# Patient Record
Sex: Female | Born: 1985 | Race: Black or African American | Hispanic: No | Marital: Single | Smoking: Current every day smoker
Health system: Southern US, Community
[De-identification: ages and names within clinical notes are randomized; demographics above are authoritative.]

## PROBLEM LIST (undated history)

## (undated) ENCOUNTER — Ambulatory Visit: Admission: EM

## (undated) DIAGNOSIS — R0600 Dyspnea, unspecified: Secondary | ICD-10-CM

## (undated) DIAGNOSIS — I209 Angina pectoris, unspecified: Secondary | ICD-10-CM

## (undated) DIAGNOSIS — I10 Essential (primary) hypertension: Secondary | ICD-10-CM

## (undated) DIAGNOSIS — R600 Localized edema: Secondary | ICD-10-CM

## (undated) DIAGNOSIS — E0500 Thyrotoxicosis with diffuse goiter without thyrotoxic crisis or storm: Secondary | ICD-10-CM

## (undated) DIAGNOSIS — Z8489 Family history of other specified conditions: Secondary | ICD-10-CM

## (undated) DIAGNOSIS — F17200 Nicotine dependence, unspecified, uncomplicated: Secondary | ICD-10-CM

## (undated) DIAGNOSIS — E119 Type 2 diabetes mellitus without complications: Secondary | ICD-10-CM

---

## 2006-02-04 ENCOUNTER — Emergency Department: Payer: Self-pay | Admitting: Emergency Medicine

## 2006-04-26 ENCOUNTER — Emergency Department: Payer: Self-pay | Admitting: Emergency Medicine

## 2007-09-06 ENCOUNTER — Emergency Department: Payer: Self-pay | Admitting: Emergency Medicine

## 2009-12-22 ENCOUNTER — Ambulatory Visit: Payer: Self-pay | Admitting: Obstetrics and Gynecology

## 2010-01-13 ENCOUNTER — Ambulatory Visit: Payer: Self-pay | Admitting: Obstetrics and Gynecology

## 2010-04-10 ENCOUNTER — Observation Stay: Payer: Self-pay | Admitting: Obstetrics and Gynecology

## 2010-04-17 ENCOUNTER — Observation Stay: Payer: Self-pay | Admitting: Obstetrics and Gynecology

## 2010-04-24 ENCOUNTER — Observation Stay: Payer: Self-pay | Admitting: Obstetrics and Gynecology

## 2010-05-01 ENCOUNTER — Observation Stay: Payer: Self-pay | Admitting: Obstetrics and Gynecology

## 2010-05-08 ENCOUNTER — Observation Stay: Payer: Self-pay

## 2010-05-15 ENCOUNTER — Observation Stay: Payer: Self-pay | Admitting: Obstetrics and Gynecology

## 2010-05-22 ENCOUNTER — Observation Stay: Payer: Self-pay | Admitting: Certified Nurse Midwife

## 2010-05-27 ENCOUNTER — Observation Stay: Payer: Self-pay | Admitting: Obstetrics and Gynecology

## 2010-05-28 ENCOUNTER — Inpatient Hospital Stay: Payer: Self-pay | Admitting: Obstetrics and Gynecology

## 2010-12-15 ENCOUNTER — Emergency Department: Payer: Self-pay | Admitting: Unknown Physician Specialty

## 2011-10-18 ENCOUNTER — Emergency Department: Payer: Self-pay | Admitting: Emergency Medicine

## 2012-03-13 ENCOUNTER — Emergency Department: Payer: Self-pay | Admitting: *Deleted

## 2012-03-27 ENCOUNTER — Emergency Department: Payer: Self-pay | Admitting: Emergency Medicine

## 2012-09-13 ENCOUNTER — Observation Stay (HOSPITAL_COMMUNITY): Payer: PRIVATE HEALTH INSURANCE

## 2012-09-13 ENCOUNTER — Emergency Department (HOSPITAL_COMMUNITY): Payer: PRIVATE HEALTH INSURANCE

## 2012-09-13 ENCOUNTER — Inpatient Hospital Stay (HOSPITAL_COMMUNITY)
Admission: EM | Admit: 2012-09-13 | Discharge: 2012-09-15 | DRG: 194 | Disposition: A | Discharge status: Home / Self Care | Payer: PRIVATE HEALTH INSURANCE | Attending: Internal Medicine | Admitting: Internal Medicine

## 2012-09-13 ENCOUNTER — Encounter (HOSPITAL_COMMUNITY): Payer: Self-pay | Admitting: *Deleted

## 2012-09-13 DIAGNOSIS — D72829 Elevated white blood cell count, unspecified: Secondary | ICD-10-CM | POA: Diagnosis present

## 2012-09-13 DIAGNOSIS — J9859 Other diseases of mediastinum, not elsewhere classified: Secondary | ICD-10-CM | POA: Diagnosis present

## 2012-09-13 DIAGNOSIS — J189 Pneumonia, unspecified organism: Principal | ICD-10-CM | POA: Diagnosis present

## 2012-09-13 DIAGNOSIS — R0602 Shortness of breath: Secondary | ICD-10-CM | POA: Diagnosis present

## 2012-09-13 DIAGNOSIS — F17200 Nicotine dependence, unspecified, uncomplicated: Secondary | ICD-10-CM | POA: Diagnosis present

## 2012-09-13 DIAGNOSIS — R002 Palpitations: Secondary | ICD-10-CM | POA: Diagnosis present

## 2012-09-13 DIAGNOSIS — R222 Localized swelling, mass and lump, trunk: Secondary | ICD-10-CM | POA: Diagnosis present

## 2012-09-13 DIAGNOSIS — Z6841 Body Mass Index (BMI) 40.0 and over, adult: Secondary | ICD-10-CM

## 2012-09-13 LAB — COMPREHENSIVE METABOLIC PANEL
ALT: 22 U/L (ref 0–35)
AST: 15 U/L (ref 0–37)
CO2: 23 mEq/L (ref 19–32)
Chloride: 103 mEq/L (ref 96–112)
GFR calc non Af Amer: >90 mL/min (ref >90)
Sodium: 135 mEq/L (ref 135–145)
Total Bilirubin: 0.4 mg/dL (ref 0.3–1.2)

## 2012-09-13 LAB — POCT I-STAT, CHEM 8
Calcium, Ion: 1.17 mmol/L (ref 1.12–1.23)
Chloride: 109 mEq/L (ref 96–112)
Glucose, Bld: 106 mg/dL — ABNORMAL HIGH (ref 70–99)
HCT: 35 % — ABNORMAL LOW (ref 36.0–46.0)
Hemoglobin: 11.9 g/dL — ABNORMAL LOW (ref 12.0–15.0)

## 2012-09-13 LAB — CBC
Platelets: 232 10*3/uL (ref 150–400)
RBC: 4.47 MIL/uL (ref 3.87–5.11)
WBC: 9.5 10*3/uL (ref 4.0–10.5)

## 2012-09-13 LAB — INFLUENZA PANEL BY PCR (TYPE A & B)
Influenza A By PCR: NEGATIVE
Influenza B By PCR: NEGATIVE

## 2012-09-13 LAB — PHOSPHORUS: Phosphorus: 4.2 mg/dL (ref 2.3–4.6)

## 2012-09-13 MED ORDER — IPRATROPIUM BROMIDE 0.02 % IN SOLN: 0.5000 mg | RESPIRATORY_TRACT | Status: DC | PRN

## 2012-09-13 MED ORDER — DEXTROSE 5 % IV SOLN
1.0000 g | INTRAVENOUS | Status: DC
  Administered 2012-09-14: 1 g via INTRAVENOUS
  Filled 2012-09-13 (×2): qty 10

## 2012-09-13 MED ORDER — ALBUTEROL SULFATE (5 MG/ML) 0.5% IN NEBU: 2.5000 mg | INHALATION_SOLUTION | RESPIRATORY_TRACT | Status: DC | PRN

## 2012-09-13 MED ORDER — DEXTROSE 5 % IV SOLN
1.0000 g | Freq: Once | INTRAVENOUS | Status: AC
  Administered 2012-09-13: 1 g via INTRAVENOUS
  Filled 2012-09-13: qty 10

## 2012-09-13 MED ORDER — ONDANSETRON HCL 4 MG/2ML IJ SOLN
4.0000 mg | Freq: Once | INTRAMUSCULAR | Status: AC
Start: 2012-09-13 — End: 2012-09-13
  Administered 2012-09-13: 4 mg via INTRAVENOUS
  Filled 2012-09-13: qty 2

## 2012-09-13 MED ORDER — DEXTROSE 5 % IV SOLN
500.0000 mg | INTRAVENOUS | Status: DC
  Administered 2012-09-14: 500 mg via INTRAVENOUS
  Filled 2012-09-13 (×2): qty 500

## 2012-09-13 MED ORDER — SODIUM CHLORIDE 0.9 % IV BOLUS (SEPSIS)
1000.0000 mL | Freq: Once | INTRAVENOUS | Status: AC
  Administered 2012-09-13: 1000 mL via INTRAVENOUS

## 2012-09-13 MED ORDER — METOPROLOL TARTRATE 25 MG PO TABS
25.0000 mg | ORAL_TABLET | Freq: Two times a day (BID) | ORAL | Status: DC
  Administered 2012-09-13 – 2012-09-15 (×4): 25 mg via ORAL
  Filled 2012-09-13 (×5): qty 1

## 2012-09-13 MED ORDER — IOHEXOL 350 MG/ML SOLN
100.0000 mL | Freq: Once | INTRAVENOUS | Status: AC | PRN
  Administered 2012-09-13: 100 mL via INTRAVENOUS

## 2012-09-13 MED ORDER — DEXTROSE 5 % IV SOLN
500.0000 mg | Freq: Once | INTRAVENOUS | Status: AC
  Administered 2012-09-13: 500 mg via INTRAVENOUS
  Filled 2012-09-13: qty 500

## 2012-09-13 MED ORDER — FENTANYL CITRATE 0.05 MG/ML IJ SOLN
50.0000 ug | Freq: Once | INTRAMUSCULAR | Status: AC
  Administered 2012-09-13: 50 ug via INTRAVENOUS
  Filled 2012-09-13: qty 2

## 2012-09-13 NOTE — ED Notes (Signed)
Patient transported to CT 

## 2012-09-13 NOTE — ED Notes (Signed)
Pt states hasn't felt well x 4 hours; felt jittery; chest pain rating 5/10; presents diaphoretic and short of breath

## 2012-09-13 NOTE — Progress Notes (Signed)
Patient ID: Kimberly Nolan, female   DOB: [birth year], 26 y.o.   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 No sig PMH noted. Pt presented to ER today with chest pain/dyspnea. Nondiagnostic eval for PE. Imaging studies were reviewed by Dr. Archer Asa. Exam: pt awake/alert; chest- CTA bilat; heart- tachycardic, reg rhythm; abd- soft,obese, +BS,NT.      Filed Vitals:   09/13/12 0712 09/13/12 0931 09/13/12 1112 09/13/12 1459  BP: 142/85 152/87 146/90 140/69  Pulse: 132 134  123  Temp:  99.1 F (37.3 C) 98.5 F (36.9 C) 99.7 F (37.6 C)  TempSrc:  Oral Oral Oral  Resp: 27 29 20 20   Height:   5\' 7"  (1.702 m)   Weight:   330 lb 11 oz (150 kg)   SpO2: 97% 100% 100% 100%   History reviewed. No pertinent past medical history. Past Surgical History  Procedure Date  . Cesarean section   Ct Angio Chest Pe W/cm &/or Wo Cm  09/13/2012  *RADIOLOGY REPORT*  Clinical Data: Chest pain.  Shortness of breath.  Diaphoresis.  CT ANGIOGRAPHY CHEST  Technique:  Multidetector CT imaging of the chest using the standard protocol during bolus administration of intravenous contrast. Multiplanar reconstructed images including MIPs were obtained and reviewed to evaluate the vascular anatomy.  Contrast: OMNIPAQUE IOHEXOL 350 MG/ML SOLN  Comparison: None.  Findings: Lung windows demonstrate mild motion degradation. Airspace and ground-glass opacities within the dependent upper lobes and lower lobes.  Soft tissue windows:  The quality of this exam for evaluation of pulmonary embolism is poor, despite 2 attempts.  The second attempt is slightly better.  However, due to patient body habitus, bolus timing, and motion artifact, is also nondiagnostic.  There is no large/central pulmonary embolism.  The thyroid gland is diffusely prominent, extending into the upper chest.  No dominant mass is identified.  Normal aortic caliber without dissection.  Mild cardiomegaly, without pericardial  or pleural effusion.  No middle mediastinal or hilar adenopathy.  The pulmonary outflow tract is mildly enlarged, at 3.2 cm.  Soft tissue density in the anterior mediastinum.  This measures 4.6 x 5.5 cm on image 20 of series 12.  Limited abdominal imaging demonstrates no significant findings.  No upper abdominal adenopathy. No acute osseous abnormality.  IMPRESSION:  1.  Poor/nondiagnostic evaluation for pulmonary embolism secondary multiple factors detailed above. 2.  Multifocal airspace and ground-glass opacities.  Most consistent with infection.  Aspiration could look similar but is felt less likely. 3.  Soft tissue fullness in the anterior mediastinum.  This is greater than typically seen  secondary to thymic tissue in this age group, therefore this is suspicious for adenopathy/lymphoma or thymic based mass.  Consider further characterization with PET or oncology referral. Findings discussed with Dr. Dierdre Highman. 4. Pulmonary artery enlargement suggests pulmonary arterial hypertension.   Original Report Authenticated By: Consuello Bossier, M.D.   Results for orders placed during the hospital encounter of 09/13/12  CBC      Component Value Range   WBC 9.5  4.0 - 10.5 K/uL   RBC 4.47  3.87 - 5.11 MIL/uL   Hemoglobin 11.3 (*) 12.0 - 15.0 g/dL   HCT 13.0 (*) 86.5 - 78.4 %   MCV 77.2 (*) 78.0 - 100.0 fL   MCH 25.3 (*) 26.0 - 34.0 pg   MCHC 32.8  30.0 - 36.0 g/dL   RDW 69.6  29.5 - 28.4 %   Platelets 232  150 -  400 K/uL  COMPREHENSIVE METABOLIC PANEL      Component Value Range   Sodium 135  135 - 145 mEq/L   Potassium 3.8  3.5 - 5.1 mEq/L   Chloride 103  96 - 112 mEq/L   CO2 23  19 - 32 mEq/L   Glucose, Bld 99  70 - 99 mg/dL   BUN 9  6 - 23 mg/dL   Creatinine, Ser 1.61 (*) 0.50 - 1.10 mg/dL   Calcium 9.5  8.4 - 09.6 mg/dL   Total Protein 7.5  6.0 - 8.3 g/dL   Albumin 2.9 (*) 3.5 - 5.2 g/dL   AST 15  0 - 37 U/L   ALT 22  0 - 35 U/L   Alkaline Phosphatase 231 (*) 39 - 117 U/L   Total Bilirubin 0.4   0.3 - 1.2 mg/dL   GFR calc non Af Amer >90  >90 mL/min   GFR calc Af Amer >90  >90 mL/min  POCT I-STAT, CHEM 8      Component Value Range   Sodium 142  135 - 145 mEq/L   Potassium 4.5  3.5 - 5.1 mEq/L   Chloride 109  96 - 112 mEq/L   BUN 10  6 - 23 mg/dL   Creatinine, Ser 0.45  0.50 - 1.10 mg/dL   Glucose, Bld 409 (*) 70 - 99 mg/dL   Calcium, Ion 8.11  9.14 - 1.23 mmol/L   TCO2 23  0 - 100 mmol/L   Hemoglobin 11.9 (*) 12.0 - 15.0 g/dL   HCT 78.2 (*) 95.6 - 21.3 %  LACTIC ACID, PLASMA      Component Value Range   Lactic Acid, Venous 1.2  0.5 - 2.2 mmol/L  HIV ANTIBODY (ROUTINE TESTING)      Component Value Range   HIV NON REACTIVE  NON REACTIVE  STREP PNEUMONIAE URINARY ANTIGEN      Component Value Range   Strep Pneumo Urinary Antigen NEGATIVE  NEGATIVE  MAGNESIUM      Component Value Range   Magnesium 1.7  1.5 - 2.5 mg/dL  PHOSPHORUS      Component Value Range   Phosphorus 4.2  2.3 - 4.6 mg/dL   A/P: Pt with hx chest pain , dyspnea, tachycardia and ant mediastinal mass. Tent plan is for CT guided biopsy of the mediastinal mass on 10/31. Details/risks of the procedure d/w pt with her understanding and consent. TSH pending. Ideally would like to have heart rate <100 before performing bx.

## 2012-09-13 NOTE — ED Notes (Signed)
Labs/ blood cultures drawn by charge nurse prior to antibiotics given

## 2012-09-13 NOTE — ED Notes (Signed)
Pt refusing having blood drawn - have not drawn second set of blood cultures. MD Elisabeth Pigeon and floor RN notified

## 2012-09-13 NOTE — ED Notes (Signed)
3e called x1 for report, nurse not able to take report will call back

## 2012-09-13 NOTE — ED Notes (Signed)
Unable to draw blood on pt, pt refusing to be stuck by this writer anymore. Charge nurse at bedside to draw blood. Admitting MD notified. MD Elisabeth Pigeon confirmed to give antibiotics now, will still try to draw cultures and will report to floor RN.  Pt ambulating to bathroom while tachycardic, advised pt not to ambulate

## 2012-09-13 NOTE — ED Provider Notes (Signed)
History     CSN: [id number]  Arrival date & time 09/13/12  0518   First MD Initiated Contact with Patient 09/13/12 [phone]      Chief Complaint  Patient presents with  . Palpitations    (Consider location/radiation/quality/duration/timing/severity/associated sxs/prior treatment) HPI HX per PT. Went to bed with a mild HA after taking some tylenol, woke up PTA with substernal CP and SOB, pain is sharp in quality and constant. No radiation, no h/o same, no h/o anxiety. No leg pain or swelling, no h/o DVT or PE> takes Depo and smokes cigarettes. No cough or fevers. Symptoms mod to severe History reviewed. No pertinent past medical history.  History reviewed. No pertinent past surgical history.  No family history on file.  History  Substance Use Topics  . Smoking status: Current Every Day Smoker    Types: Cigarettes  . Smokeless tobacco: Not on file  . Alcohol Use: No    OB History    Grav Para Term Preterm Abortions TAB SAB Ect Mult Living                  Review of Systems  Constitutional: Negative for fever and chills.  HENT: Negative for neck pain and neck stiffness.   Eyes: Negative for pain.  Respiratory: Positive for shortness of breath.   Cardiovascular: Positive for chest pain and palpitations. Negative for leg swelling.  Gastrointestinal: Negative for abdominal pain.  Genitourinary: Negative for dysuria.  Musculoskeletal: Negative for back pain.  Skin: Negative for rash.  Neurological: Negative for headaches.  All other systems reviewed and are negative.    Allergies  Review of patient's allergies indicates no known allergies.  Home Medications  No current outpatient prescriptions on file.  BP 163/85  Pulse 135  Temp 98.7 F (37.1 C) (Oral)  Resp 28  SpO2 98%  LMP 08/30/2012  Physical Exam  Constitutional: She is oriented to person, place, and time. She appears well-developed and well-nourished.  HENT:  Head: Normocephalic and atraumatic.    Eyes: Conjunctivae normal and EOM are normal. Pupils are equal, round, and reactive to light.  Neck: Trachea normal. Neck supple. No thyromegaly present.  Cardiovascular: Regular rhythm, S1 normal, S2 normal and normal pulses.     No systolic murmur is present   No diastolic murmur is present  Pulses:      Radial pulses are 2+ on the right side, and 2+ on the left side.       tachycardic  Pulmonary/Chest: Effort normal and breath sounds normal. She has no wheezes. She has no rhonchi. She has no rales. She exhibits no tenderness.  Abdominal: Soft. Normal appearance and bowel sounds are normal. There is no tenderness. There is no CVA tenderness and negative Murphy's sign.  Musculoskeletal:       BLE:s Calves nontender, no cords or erythema, negative Homans sign  Neurological: She is alert and oriented to person, place, and time. She has normal strength. No cranial nerve deficit or sensory deficit. GCS eye subscore is 4. GCS verbal subscore is 5. GCS motor subscore is 6.  Skin: Skin is warm and dry. No rash noted. She is not diaphoretic.  Psychiatric: Her speech is normal.       Cooperative and appropriate    ED Course  Procedures (including critical care time)  Results for orders placed during the hospital encounter of 09/13/12  POCT I-STAT, CHEM 8      Component Value Range   Sodium 142  135 -  145 mEq/L   Potassium 4.5  3.5 - 5.1 mEq/L   Chloride 109  96 - 112 mEq/L   BUN 10  6 - 23 mg/dL   Creatinine, Ser 1.61  0.50 - 1.10 mg/dL   Glucose, Bld 096 (*) 70 - 99 mg/dL   Calcium, Ion 0.45  4.09 - 1.23 mmol/L   TCO2 23  0 - 100 mmol/L   Hemoglobin 11.9 (*) 12.0 - 15.0 g/dL   HCT 81.1 (*) 91.4 - 78.2 %   Ct Angio Chest Pe W/cm &/or Wo Cm  09/13/2012  *RADIOLOGY REPORT*  Clinical Data: Chest pain.  Shortness of breath.  Diaphoresis.  CT ANGIOGRAPHY CHEST  Technique:  Multidetector CT imaging of the chest using the standard protocol during bolus administration of intravenous contrast.  Multiplanar reconstructed images including MIPs were obtained and reviewed to evaluate the vascular anatomy.  Contrast: OMNIPAQUE IOHEXOL 350 MG/ML SOLN  Comparison: None.  Findings: Lung windows demonstrate mild motion degradation. Airspace and ground-glass opacities within the dependent upper lobes and lower lobes.  Soft tissue windows:  The quality of this exam for evaluation of pulmonary embolism is poor, despite 2 attempts.  The second attempt is slightly better.  However, due to patient body habitus, bolus timing, and motion artifact, is also nondiagnostic.  There is no large/central pulmonary embolism.  The thyroid gland is diffusely prominent, extending into the upper chest.  No dominant mass is identified.  Normal aortic caliber without dissection.  Mild cardiomegaly, without pericardial or pleural effusion.  No middle mediastinal or hilar adenopathy.  The pulmonary outflow tract is mildly enlarged, at 3.2 cm.  Soft tissue density in the anterior mediastinum.  This measures 4.6 x 5.5 cm on image 20 of series 12.  Limited abdominal imaging demonstrates no significant findings.  No upper abdominal adenopathy. No acute osseous abnormality.  IMPRESSION:  1.  Poor/nondiagnostic evaluation for pulmonary embolism secondary multiple factors detailed above. 2.  Multifocal airspace and ground-glass opacities.  Most consistent with infection.  Aspiration could look similar but is felt less likely. 3.  Soft tissue fullness in the anterior mediastinum.  This is greater than typically seen  secondary to thymic tissue in this age group, therefore this is suspicious for adenopathy/lymphoma or thymic based mass.  Consider further characterization with PET or oncology referral. Findings discussed with Dr. Dierdre Highman. 4. Pulmonary artery enlargement suggests pulmonary arterial hypertension.   Original Report Authenticated By: Consuello Bossier, M.D.       Date: 09/13/2012  Rate: 134  Rhythm: sinus tachycardia  QRS Axis:  normal  Intervals: normal  ST/T Wave abnormalities: nonspecific ST changes  Conduction Disutrbances:none  Narrative Interpretation:   Old EKG Reviewed: none available   7:47 AM d/w Dr Elisabeth Pigeon, pending CBC, IV ABX rocephin and azithro for bilat PNA. Hopitalist agree with initial ABX and will admit.  MDM   26 yo female with tachycradia, SOB and CP sent for PE study on arrival to ED as she is a smoker and on Depo. CT as above, started on ABX and plan MED admit. IVFs for tachycradia, no hypoxia or hypotension. Afebrile. Mediastinal mass        Kimberly Nielsen, MD 09/13/12 [phone]

## 2012-09-13 NOTE — ED Notes (Signed)
Patient has refused blood draw for second blood culture.RN made aware

## 2012-09-13 NOTE — H&P (Addendum)
[facility] History and Physical  Kimberly Nolan ZOX:[id number] DOB: December 05, 1985 DOA: 09/13/2012  Referring physician: ER physician PCP: No primary provider on file.   Chief Complaint: shortness of breath  HPI:  26 year old female, active smoker who presented with sudden onset palpitations and shortness of breath started one night prior to admission associated with substernal chest pain. Chest pain was non reproducible, non radiating and subsided on its own. It was constant, about 5-6/10 in intensity. No abdominal pain, no nausea or vomiting. Patient reports no simliar symptoms in past. No lightheadedness or loss of consciousness.  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 Negative for diaphoresis.  HENT: Negative for hearing loss, ear pain, nosebleeds, congestion, sore throat, neck pain, tinnitus and ear discharge.   Eyes: Negative for blurred vision, double vision, photophobia, pain, discharge and redness.  Respiratory: Negative for cough, hemoptysis, sputum production, positive for shortness of breath, negative for wheezing and stridor.   Cardiovascular: positive for chest pain, palpitations, negative for orthopnea, claudication and leg swelling.    Gastrointestinal: Negative for nausea, vomiting and abdominal pain. Negative for heartburn, constipation, blood in stool and melena.  Genitourinary: Negative for dysuria, urgency, frequency, hematuria and flank pain.  Musculoskeletal: Negative for myalgias, back pain, joint pain and falls.  Skin: Negative for itching and rash.  Neurological: Negative for dizziness and weakness. Negative for tingling, tremors, sensory change, speech change, focal weakness, loss of consciousness and headaches.  Endo/Heme/Allergies: Negative for environmental allergies and polydipsia. Does not bruise/bleed easily.  Psychiatric/Behavioral: Negative for suicidal ideas. The patient is not nervous/anxious.      History reviewed. No pertinent past medical history. History reviewed. No pertinent past surgical history. Social History:  reports that she has been smoking Cigarettes.  She does not have any smokeless tobacco history on file. She reports that she does not drink alcohol or use illicit drugs.  No Known Allergies  Family History: thyroid problems in mother  Prior to Admission medications   Medication Sig Start Date End Date Taking? Authorizing Provider  IRON PO Take by mouth.   Yes Historical Provider, MD  medroxyPROGESTERone (DEPO-PROVERA) 150 MG/ML injection Inject 150 mg into the muscle every 3 (three) months.   Yes Historical Provider, MD   Physical Exam: Filed Vitals:   09/13/12 0518 09/13/12 0712  BP: 163/85 142/85  Pulse: 135 132  Temp: 98.7 F (37.1 C)   TempSrc: Oral   Resp: 28 27  SpO2: 98% 97%    Physical Exam  Constitutional: Appears well-developed and well-nourished. No distress; obese.  HENT: Normocephalic. External right and left ear normal. Oropharynx is clear and moist.  Eyes: Conjunctivae and EOM are normal. PERRLA, no scleral icterus.  Neck: Normal ROM. Neck supple. No JVD. No tracheal deviation. Obese neck  CVS: RRR, S1/S2 +, no murmurs, no  gallops, no carotid bruit.   Pulmonary: Effort and breath sounds normal, no stridor, rhonchi, wheezes, rales.  Abdominal: Soft. BS +,  no distension, tenderness, rebound or guarding.  Musculoskeletal: Normal range of motion. No edema and no tenderness.  Lymphadenopathy: No lymphadenopathy noted, cervical, inguinal. Neuro: Alert. Normal reflexes, muscle tone coordination. No cranial nerve deficit. Skin: Skin is warm and dry. No rash noted. Not diaphoretic. No erythema. No pallor.  Psychiatric: Normal mood and affect. Behavior, judgment, thought content normal.   Labs on Admission:  Basic Metabolic Panel:  Lab 09/13/12 4782  NA 142  K 4.5  CL 109  CO2 --  GLUCOSE 106*  BUN 10  CREATININE 0.60  CALCIUM --  MG --  PHOS --   Liver Function Tests: No results found for this basename: AST:5,ALT:5,ALKPHOS:5,BILITOT:5,PROT:5,ALBUMIN:5 in the last 168 hours No results found for this basename: LIPASE:5,AMYLASE:5 in the last 168 hours No results found for this basename: AMMONIA:5 in the last 168 hours CBC:  Lab 09/13/12 0559  WBC --  HGB 11.9*  HCT 35.0*  MCV --  PLT --   Cardiac Enzymes: No results found for this basename: CKTOTAL:5,CKMB:5,CKMBINDEX:5,TROPONINI:5 in the last 168 hours BNP: No components found with this basename: POCBNP:5 CBG: No results found for this basename: GLUCAP:5 in the last 168 hours  Radiological Exams on Admission: Ct Angio Chest Pe W/cm &/or Wo Cm 09/13/2012  *  IMPRESSION:  1.  Poor/nondiagnostic evaluation for pulmonary embolism secondary multiple factors detailed above. 2.  Multifocal airspace and ground-glass opacities.  Most consistent with infection.  Aspiration could look similar but is felt less likely. 3.  Soft tissue fullness in the anterior mediastinum.  This is greater than typically seen  secondary to thymic tissue in this age group, therefore this is suspicious for adenopathy/lymphoma or thymic based mass.  Consider further characterization with PET or oncology  referral. Findings discussed with Dr. Dierdre Highman. 4. Pulmonary artery enlargement suggests pulmonary arterial hypertension.   Original Report Authenticated By: Consuello Bossier, M.D.     EKG: Normal sinus rhythm, no ST/T wave changes  Code Status: Full Family Communication: Pt at bedside Disposition Plan: Admit for further evaluation; observation, telemetry  Time spent: 75 minutes  Manson Passey, MD  [facility] Pager [phone]  If 7PM-7AM, please contact night-coverage [email or web address] Password [email or web address] 09/13/2012, 7:49 AM

## 2012-09-14 ENCOUNTER — Observation Stay (HOSPITAL_COMMUNITY): Payer: PRIVATE HEALTH INSURANCE

## 2012-09-14 DIAGNOSIS — J189 Pneumonia, unspecified organism: Principal | ICD-10-CM

## 2012-09-14 DIAGNOSIS — R222 Localized swelling, mass and lump, trunk: Secondary | ICD-10-CM

## 2012-09-14 DIAGNOSIS — R002 Palpitations: Secondary | ICD-10-CM

## 2012-09-14 DIAGNOSIS — M7989 Other specified soft tissue disorders: Secondary | ICD-10-CM

## 2012-09-14 DIAGNOSIS — D72829 Elevated white blood cell count, unspecified: Secondary | ICD-10-CM

## 2012-09-14 DIAGNOSIS — R0602 Shortness of breath: Secondary | ICD-10-CM

## 2012-09-14 LAB — CBC
HCT: 33.8 % — ABNORMAL LOW (ref 36.0–46.0)
MCV: 78.2 fL (ref 78.0–100.0)
RBC: 4.32 MIL/uL (ref 3.87–5.11)
WBC: 5.3 10*3/uL (ref 4.0–10.5)

## 2012-09-14 LAB — COMPREHENSIVE METABOLIC PANEL
Albumin: 2.5 g/dL — ABNORMAL LOW (ref 3.5–5.2)
BUN: 11 mg/dL (ref 6–23)
Calcium: 9.3 mg/dL (ref 8.4–10.5)
Creatinine, Ser: 0.42 mg/dL — ABNORMAL LOW (ref 0.50–1.10)
GFR calc Af Amer: >90 mL/min (ref >90)
Glucose, Bld: 100 mg/dL — ABNORMAL HIGH (ref 70–99)
Total Protein: 6.8 g/dL (ref 6.0–8.3)

## 2012-09-14 LAB — GLUCOSE, CAPILLARY: Glucose-Capillary: 106 mg/dL — ABNORMAL HIGH (ref 70–99)

## 2012-09-14 LAB — LEGIONELLA ANTIGEN, URINE: Legionella Antigen, Urine: NEGATIVE

## 2012-09-14 LAB — PROTIME-INR: INR: 1.07 (ref 0.00–1.49)

## 2012-09-14 NOTE — Progress Notes (Signed)
*  Preliminary Results* Bilateral lower extremity venous duplex completed. Technically limited study due to patient body habitus and depth of vessels. There is no obvious evidence of deep vein thrombosis involving bilateral saphenofemoral junctions, common femoral, proximal femoral, and popliteal veins.  Unable to definitively exclude deep vein thrombosis of bilateral mid and distal femoral veins, posterior tibial and peroneal veins due to lack of visualization of vessels. Preliminary results discussed with Rinaldo Cloud, RN.  09/14/2012 10:49 AM Gertie Fey, RDMS, RDCS

## 2012-09-14 NOTE — Progress Notes (Signed)
Agree with PA note.  Lesion is accessible by CT guidance via left parasternal approach.   Signed,  Sterling Big, MD Vascular & Interventional Radiologist [facility]

## 2012-09-14 NOTE — Progress Notes (Signed)
*  PRELIMINARY RESULTS* Echocardiogram 2D Echocardiogram has been performed.  Jeryl Columbia 09/14/2012, 2:47 PM

## 2012-09-14 NOTE — Progress Notes (Addendum)
[facility] PROGRESS NOTE  Dorea Duff NWG:[id number] DOB: 06-Aug-1986 DOA: 09/13/2012 PCP: No primary provider on file.  Brief narrative: 26 year old female, active smoker who presented with sudden onset palpitations and shortness of breath started one night prior to admission associated with substernal chest pain. D dimer was collected and elevated at 0.7 but lower extremity doppler and CT angio chest (although somewhat of poor diagnostic value due to artifacts and patient's body habitus) did not reveal DVT's or central pulmonary embolism. 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 I spoke with oncology recomendation was to get CTS consult for possible mediastinoscopy for biopsy. I spoke with Dr. Laneta Simmers of CTS and recommendation was for CT guided biopsy but ideally not until pneumonia resolves. This leaves [place] to an outpatient work up.  Assessment and Plan:   Principal Problem:  *Shortness of breath  - likely secondary to bilateral pneumonia - CT chest angio did not show central embolism - I called PCCM for input on CT angio interpretation and recommendation was to treat pneumonia and get 2 D ECHO to evaluate for right heart strain - continue antibiotics: azithromycin and ceftriaxone  - nebulizer treatments as needed  - follow up labs associated with pneumonia order set: HIV, strep pneumon, legionella - Blood cultures to date are negative  Active Problems:  CAP (community acquired pneumonia)  - management as above with empiric antibiotics  Mediastinal mass  - per oncology recommendation we will need much better tissue diagnosis in case this is thymoma so CTS would be better to consult for possible mediastinoscopy; IR consult on hold Leukocytosis  - secondary to pneumonia  - resolved   Code Status: full code Family Communication: family not at bedside Disposition Plan: home when stable  Manson Passey, MD  [facility] Pager [phone]  If  7PM-7AM, please contact night-coverage [email or web address] Password [email or web address] 09/14/2012, 11:29 AM   LOS: 1 day   Consultants:  Cardiothoracic surgery  PCCM - phone call only for input on CT angio chest interpretation   Procedures:  None   Antibiotics:  Azithromycin 09/13/2012 -->  Ceftriaxone 09/13/2012 -->  HPI/Subjective: Patient says she feels better today.  Objective: Filed Vitals:   09/13/12 1459 09/13/12 2249 09/14/12 0700 09/14/12 0715  BP: 140/69 147/86  137/67  Pulse: 123 104  98  Temp: 99.7 F (37.6 C) 98.3 F (36.8 C)  97.4 F (36.3 C)  TempSrc: Oral Oral  Oral  Resp: 20 20  20   Height:      Weight:   150.2 kg (331 lb 2.1 oz)   SpO2: 100% 100%  100%    Intake/Output Summary (Last 24 hours) at 09/14/12 1129 Last data filed at 09/14/12 1033  Gross per 24 hour  Intake    960 ml  Output    800 ml  Net    160 ml    Exam:   General:  Pt is alert, follows commands appropriately, not in acute distress; obese female  Cardiovascular: Regular rate and rhythm, S1/S2, no murmurs, no rubs, no gallops  Respiratory: Clear to auscultation bilaterally, no wheezing, no crackles, no rhonchi  Abdomen: Soft, non tender, non distended, bowel sounds present, no guarding  Extremities: No edema, pulses DP and PT palpable bilaterally  Neuro: Grossly nonfocal  Data Reviewed: Basic Metabolic Panel:  Lab 09/14/12 2952 09/13/12 0815 09/13/12 0559  NA 138 135 142  K 4.1 3.8 4.5  CL 106 103 109  CO2 22 23 --  GLUCOSE 100*  99 106*  BUN 11 9 10   CREATININE 0.42* 0.40* 0.60  CALCIUM 9.3 9.5 --   Liver Function Tests:  Lab 09/14/12 0450 09/13/12 0815  AST 12 15  ALT 19 22  ALKPHOS 194* 231*  BILITOT 0.3 0.4  PROT 6.8 7.5  ALBUMIN 2.5* 2.9*   CBC:  Lab 09/14/12 0450 09/13/12 0815 09/13/12 0559  WBC 5.3 9.5 --  HGB 11.0* 11.3* 11.9*  HCT 33.8* 34.5* 35.0*  MCV 78.2 77.2* --  PLT 219 232 --   CBG:  Lab 09/14/12 0731  GLUCAP 106*    CULTURE, BLOOD  (ROUTINE X 2)     Status: Normal (Preliminary result)   Collection Time   09/13/12  8:15 AM      Component Value Range Status Comment   Culture     Final    Value:        BLOOD CULTURE RECEIVED NO GROWTH TO DATE    Report Status PENDING   Incomplete   CULTURE, BLOOD (ROUTINE X 2)     Status: Normal (Preliminary result)   Collection Time   09/13/12  1:35 PM      Component Value Range Status Comment   Culture     Final    Value:        BLOOD CULTURE RECEIVED NO GROWTH TO DATE    Report Status PENDING   Incomplete      Studies: Ct Angio Chest Pe W/cm &/or Wo Cm 09/13/2012    IMPRESSION:  1.  Poor/nondiagnostic evaluation for pulmonary embolism secondary multiple factors detailed above. 2.  Multifocal airspace and ground-glass opacities.  Most consistent with infection.  Aspiration could look similar but is felt less likely. 3.  Soft tissue fullness in the anterior mediastinum.  This is greater than typically seen  secondary to thymic tissue in this age group, therefore this is suspicious for adenopathy/lymphoma or thymic based mass.  Consider further characterization with PET or oncology referral. Findings discussed with Dr. Dierdre Highman. 4. Pulmonary artery enlargement suggests pulmonary arterial hypertension.   Original Report Authenticated By: Consuello Bossier, M.D.    Dg Chest Port 1 View 09/13/2012  *  IMPRESSION: Mild cardiomegaly, vascular congestion. Probable improvement in the previously seen multifocal airspace disease on CT.      Scheduled Meds:  . azithromycin  500 mg Intravenous Q24H  . cefTRIAXone  1 g Intravenous Q24H  . metoprolol tartrate  25 mg Oral BID

## 2012-09-15 ENCOUNTER — Ambulatory Visit (HOSPITAL_COMMUNITY): Payer: PRIVATE HEALTH INSURANCE

## 2012-09-15 LAB — GLUCOSE, CAPILLARY: Glucose-Capillary: 102 mg/dL — ABNORMAL HIGH (ref 70–99)

## 2012-09-15 MED ORDER — LEVOFLOXACIN 500 MG PO TABS: 500.0000 mg | ORAL_TABLET | Freq: Every day | ORAL | Status: DC

## 2012-09-15 NOTE — Progress Notes (Signed)
Talked to patient about follow up medical care; patient works 20-36hrs at [employer] and is on her [employer]. Instructed patient to contact her mother and have her bring a copy of her insurance card to the hospital. Informed patient of the importance of getting a PCP and of [facility], [facility] and [facility] medication assistance program of $4.00 medication. CM also talked to patient about eating healthy and exercise. Patient has a 89yr old daughter and a dog that she walks occasionally. Lots of encouragement given to patient about consistency. Abelino Derrick RN,BSN,MHA

## 2012-09-15 NOTE — Discharge Summary (Signed)
Physician Discharge Summary  Kimberly Nolan AVW:[id number] DOB: May 22, 1986 DOA: 09/13/2012  PCP: No primary provider on file.  Admit date: 09/13/2012 Discharge date: 09/15/2012  Recommendations for Outpatient Follow-up:  1. Please note that interventional radiology has been contacted in regards to that biopsy of anterior mediastinal mass. The schedule her for interventional radiology will be contacting the patient for appointment scheduled.  Discharge Diagnoses:  Active Problems:  Shortness of breath  CAP (community acquired pneumonia)  Mediastinal mass  Leukocytosis  Palpitations  Discharge Condition:  Medically stable and clinically appears well for discharge home today; I have informed patient that she should complete 2 week course of antibiotics prior to the biopsy  Diet recommendation: As tolerated  History of present illness:  26 year old female, active smoker who presented with sudden onset palpitations and shortness of breath started one night prior to admission associated with substernal chest pain. D dimer was collected and elevated at 0.7 but lower extremity doppler and CT angio chest (although somewhat of poor diagnostic value due to artifacts and patient's body habitus) did not reveal DVT's or central pulmonary embolism. 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 I spoke with oncology and recommendation was for cardiothoracic surgery consult for mediastinoscopy. I spoke with Dr. Laneta Simmers of CTS and he recommended rather CT guided biopsy which needs to be done after pneumonia is cleared. I spoke with the patient extensively about the plan of care after discharge which includes taking antibiotic (levaquin) for 2 weeks and I also spoke with interventional radiology for biopsy after patient completes antibiotic regimen. The IR office will call the patient to schedule the appointment.   Assessment and Plan:   Principal Problem:  *Shortness of breath  - likely secondary  to bilateral pneumonia  - CT chest angio did not show central embolism  - I called PCCM for input on CT angio interpretation and recommendation was to treat pneumonia and get 2 D ECHO to evaluate for right heart strain; 2 D ECHO showed EF 55% and no RVH strain although again due to patient's morbidly obese body habitus this was not as of diagnostic value - patient was on azithromycin and ceftriaxone while in hospital but will be discharged with Levaquin for 14 days - nebulizer treatments as needed were give while in hospital - patient's respiratory status is stable  - follow up labs associated with pneumonia order set: HIV, strep pneumon, legionella - negative - Blood cultures to date are negative   Active Problems:  CAP (community acquired pneumonia)  - management as above Mediastinal mass  - please refer to the note above; IR will call the patient to schedule the appointment Leukocytosis  - secondary to pneumonia  - resolved   Code Status: full code  Family Communication: family not at bedside  Disposition Plan: home today  Manson Passey, MD  [facility]  Pager [phone]    Consultants:  Cardiothoracic surgery  PCCM - phone call only for input on CT angio chest interpretation  Procedures:  LE venous doppler - negative for DVT's  Antibiotics:  Azithromycin 09/13/2012 --> D/C 11/1 Ceftriaxone 09/13/2012 --> D/C 11/1 Levaquin - on discharge for 14 days  Discharge Exam: Filed Vitals:   09/15/12 0612  BP: 141/68  Pulse: 90  Temp: 98.1 F (36.7 C)  Resp: 18   Filed Vitals:   09/14/12 2043 09/15/12 0500 09/15/12 0506 09/15/12 0612  BP: 149/87  135/79 141/68  Pulse: 95  86 90  Temp: 98.1 F (36.7 C)  98.3 F (36.8 C) 98.1 F (36.7 C)  TempSrc: Oral  Oral Oral  Resp: 20  18 18   Height:      Weight:  150.4 kg (331 lb 9.2 oz)    SpO2: 97%  98% 99%    General: Pt is alert, follows commands appropriately, not in acute distress Cardiovascular: Regular rate and rhythm, S1/S2 +,  no murmurs, no rubs, no gallops Respiratory: Clear to auscultation bilaterally, no wheezing, no crackles, no rhonchi Abdominal: Soft, non tender, non distended, bowel sounds +, no guarding Extremities: no edema, no cyanosis, pulses palpable bilaterally DP and PT Neuro: Grossly nonfocal  Discharge Instructions  Discharge Orders    Future Orders Please Complete By Expires   CT Biopsy   12/16/13   Questions: Responses:   Is the patient pregnant? No   Preferred imaging location? [facility]   Reason for exam: biopsy of the mediastinal mass   Diet - low sodium heart healthy      Increase activity slowly      Discharge instructions      Comments:   Please note that the scheduler for interventional radiology will be getting in touch with you to make an appointment for biopsy of anterior mediastinal mass.   Call MD for:  persistant nausea and vomiting      Call MD for:  severe uncontrolled pain      Call MD for:  difficulty breathing, headache or visual disturbances      Call MD for:  persistant dizziness or light-headedness          Medication List     As of 09/15/2012 11:55 AM    TAKE these medications         IRON PO   Take by mouth.      levofloxacin 500 MG tablet   Commonly known as: LEVAQUIN   Take 1 tablet (500 mg total) by mouth daily.      medroxyPROGESTERone 150 MG/ML injection   Commonly known as: DEPO-PROVERA   Inject 150 mg into the muscle every 3 (three) months.          The results of significant diagnostics from this hospitalization (including imaging, microbiology, ancillary and laboratory) are listed below for reference.    Significant Diagnostic Studies: Ct Angio Chest Pe W/cm &/or Wo Cm 09/13/2012  * IMPRESSION:  1.  Poor/nondiagnostic evaluation for pulmonary embolism secondary multiple factors detailed above. 2.  Multifocal airspace and ground-glass opacities.  Most consistent with infection.  Aspiration could look similar but is felt less  likely. 3.  Soft tissue fullness in the anterior mediastinum.  This is greater than typically seen  secondary to thymic tissue in this age group, therefore this is suspicious for adenopathy/lymphoma or thymic based mass.  Consider further characterization with PET or oncology referral. Findings discussed with Dr. Dierdre Highman. 4. Pulmonary artery enlargement suggests pulmonary arterial hypertension.   Original Report Authenticated By: Consuello Bossier, M.D.    Dg Chest Port 1 View 09/13/2012  *  IMPRESSION: Mild cardiomegaly, vascular congestion. Probable improvement in the previously seen multifocal airspace disease on CT.   Original Report Authenticated By: Cyndie Chime, M.D.     Microbiology: CULTURE, BLOOD (ROUTINE X 2)     Status: Normal (Preliminary result)   Collection Time   09/13/12  8:15 AM      Component Value Range Status Comment   Culture     Final    Value:  BLOOD CULTURE RECEIVED NO GROWTH TO DATE  REPORT   Report Status PENDING   Incomplete   CULTURE, BLOOD (ROUTINE X 2)     Status: Normal (Preliminary result)   Collection Time   09/13/12  1:35 PM      Component Value Range Status Comment   Culture     Final    Value:        BLOOD CULTURE RECEIVED NO GROWTH TO DATE    Report Status PENDING   Incomplete      Labs: Basic Metabolic Panel:  Lab 09/14/12 1914 09/13/12 0815 09/13/12 0559  NA 138 135 142  K 4.1 3.8 4.5  CL 106 103 109  CO2 22 23 --  GLUCOSE 100* 99 106*  BUN 11 9 10   CREATININE 0.42* 0.40* 0.60  CALCIUM 9.3 9.5 --  MG -- 1.7 --  PHOS -- 4.2 --   Liver Function Tests:  Lab 09/14/12 0450 09/13/12 0815  AST 12 15  ALT 19 22  ALKPHOS 194* 231*  BILITOT 0.3 0.4  PROT 6.8 7.5  ALBUMIN 2.5* 2.9*   CBC:  Lab 09/14/12 0450 09/13/12 0815 09/13/12 0559  WBC 5.3 9.5 --  HGB 11.0* 11.3* 11.9*  HCT 33.8* 34.5* 35.0*  MCV 78.2 77.2* --  PLT 219 232 --   CBG:  Lab 09/15/12 0749 09/14/12 0731  GLUCAP 102* 106*    Time coordinating discharge: Over  30 minutes  Signed:  Manson Passey, MD  [facility] 09/15/2012, 11:55 AM Pager #: [phone]

## 2012-09-19 ENCOUNTER — Other Ambulatory Visit (HOSPITAL_COMMUNITY): Payer: Self-pay | Admitting: Internal Medicine

## 2012-09-19 DIAGNOSIS — R0602 Shortness of breath: Secondary | ICD-10-CM

## 2012-09-19 DIAGNOSIS — J9859 Other diseases of mediastinum, not elsewhere classified: Secondary | ICD-10-CM

## 2012-09-19 DIAGNOSIS — D72829 Elevated white blood cell count, unspecified: Secondary | ICD-10-CM

## 2012-09-19 DIAGNOSIS — R002 Palpitations: Secondary | ICD-10-CM

## 2012-09-19 LAB — CULTURE, BLOOD (ROUTINE X 2)

## 2012-09-28 ENCOUNTER — Other Ambulatory Visit: Payer: Self-pay | Admitting: Radiology

## 2012-09-29 ENCOUNTER — Encounter (HOSPITAL_COMMUNITY): Payer: Self-pay

## 2012-10-02 ENCOUNTER — Ambulatory Visit (HOSPITAL_COMMUNITY)
Admission: RE | Admit: 2012-10-02 | Discharge: 2012-10-02 | Disposition: A | Discharge status: Home / Self Care | Payer: PRIVATE HEALTH INSURANCE | Source: Ambulatory Visit | Attending: Internal Medicine | Admitting: Internal Medicine

## 2012-10-02 ENCOUNTER — Ambulatory Visit (HOSPITAL_COMMUNITY)
Admission: RE | Admit: 2012-10-02 | Discharge: 2012-10-02 | Disposition: A | Discharge status: Home / Self Care | Payer: PRIVATE HEALTH INSURANCE | Source: Ambulatory Visit | Attending: Interventional Radiology | Admitting: Interventional Radiology

## 2012-10-02 ENCOUNTER — Encounter (HOSPITAL_COMMUNITY): Payer: Self-pay

## 2012-10-02 DIAGNOSIS — J9859 Other diseases of mediastinum, not elsewhere classified: Secondary | ICD-10-CM

## 2012-10-02 DIAGNOSIS — R0602 Shortness of breath: Secondary | ICD-10-CM | POA: Insufficient documentation

## 2012-10-02 DIAGNOSIS — R222 Localized swelling, mass and lump, trunk: Secondary | ICD-10-CM | POA: Insufficient documentation

## 2012-10-02 DIAGNOSIS — D72829 Elevated white blood cell count, unspecified: Secondary | ICD-10-CM | POA: Insufficient documentation

## 2012-10-02 DIAGNOSIS — R002 Palpitations: Secondary | ICD-10-CM | POA: Insufficient documentation

## 2012-10-02 LAB — CBC
HCT: 32.4 % — ABNORMAL LOW (ref 36.0–46.0)
MCHC: 33 g/dL (ref 30.0–36.0)
Platelets: 170 10*3/uL (ref 150–400)
RDW: 13.6 % (ref 11.5–15.5)

## 2012-10-02 LAB — PROTIME-INR: INR: 1.09 (ref 0.00–1.49)

## 2012-10-02 LAB — APTT: aPTT: 37 seconds (ref 24–37)

## 2012-10-02 MED ORDER — MIDAZOLAM HCL 2 MG/2ML IJ SOLN
INTRAMUSCULAR | Status: AC
  Filled 2012-10-02: qty 4

## 2012-10-02 MED ORDER — FENTANYL CITRATE 0.05 MG/ML IJ SOLN
INTRAMUSCULAR | Status: DC | PRN
  Administered 2012-10-02: 50 ug via INTRAVENOUS
  Administered 2012-10-02 (×2): 25 ug via INTRAVENOUS
  Administered 2012-10-02: 50 ug via INTRAVENOUS

## 2012-10-02 MED ORDER — SODIUM CHLORIDE 0.9 % IV SOLN: INTRAVENOUS | Status: DC

## 2012-10-02 MED ORDER — HYDROMORPHONE HCL PF 1 MG/ML IJ SOLN
1.0000 mg | Freq: Once | INTRAMUSCULAR | Status: AC
  Administered 2012-10-02: 1 mg via INTRAVENOUS

## 2012-10-02 MED ORDER — FENTANYL CITRATE 0.05 MG/ML IJ SOLN
INTRAMUSCULAR | Status: AC
  Filled 2012-10-02: qty 2

## 2012-10-02 MED ORDER — MIDAZOLAM HCL 2 MG/2ML IJ SOLN
INTRAMUSCULAR | Status: DC | PRN
  Administered 2012-10-02 (×4): 1 mg via INTRAVENOUS

## 2012-10-02 MED ORDER — HYDROMORPHONE HCL PF 1 MG/ML IJ SOLN
INTRAMUSCULAR | Status: AC
  Filled 2012-10-02: qty 1

## 2012-10-02 NOTE — H&P (Signed)
Chief Complaint: MEdiastinal mass HPI: Kimberly Nolan is an 26 y.o. female found to have a mediastinal mass. She completed a course of abx for pneumonia and feels well now. No recurrent fever, SOB, abd pain, dysuria. Scheduled here today for biopsy.  Past Medical History: No past medical history on file.  Past Surgical History:  Past Surgical History  Procedure Date  . Cesarean section     Family History: No family history on file.  Social History:  reports that she has been smoking Cigarettes.  She has a .3 pack-year smoking history. She has never used smokeless tobacco. She reports that she does not drink alcohol or use illicit drugs.  Allergies: No Known Allergies  Medications: IRON PO Sig - Route: Take 1 tablet by mouth daily. - Oral Class: Historical Med Number of times this order has been changed since signing: 3 [address] levofloxacin (LEVAQUIN) 500 MG tablet 14 tablet 0 09/15/2012 Sig - Route: Take 1 tablet (500 mg total) by mouth daily. - Oral Class: Print Number of times this order has been changed since signing: 1 Order [address] medroxyPROGESTERone (DEPO-PROVERA) 150 MG/ML injection Sig - Route: Inject 150 mg into the muscle every 3 (three)    Please HPI for pertinent positives, otherwise complete 10 system ROS negative.  Physical Exam: Blood pressure 157/90, pulse 123, temperature 98.3 F (36.8 C), temperature source Oral, resp. rate 18, height 5\' 7"  (1.702 m), weight 325 lb (147.419 kg), last menstrual period 09/21/2012, SpO2 99.00%. Body mass index is 50.90 kg/(m^2).   General Appearance:  Alert, cooperative, no distress, appears stated age, morbidly obese  Head:  Normocephalic, without obvious abnormality, atraumatic  ENT: Unremarkable  Neck: Supple, symmetrical, trachea midline, no adenopathy, thyroid: not enlarged, symmetric, no tenderness/mass/nodules  Lungs:   Clear to auscultation bilaterally, no w/r/r, respirations unlabored without use of accessory  muscles.  Chest Wall:  No tenderness or deformity  Heart:  Regular rate and rhythm, S1, S2 normal, no murmur, rub or gallop. Carotids 2+ without bruit.  Neurologic: Normal affect, no gross deficits.   Results for orders placed during the hospital encounter of 10/02/12 (from the past 48 hour(s))  APTT     Status: Normal   Collection Time   10/02/12  7:02 AM      Component Value Range Comment   aPTT 37  24 - 37 seconds   CBC     Status: Abnormal   Collection Time   10/02/12  7:02 AM      Component Value Range Comment   WBC 5.5  4.0 - 10.5 K/uL    RBC 4.14  3.87 - 5.11 MIL/uL    Hemoglobin 10.7 (*) 12.0 - 15.0 g/dL    HCT 40.9 (*) 81.1 - 46.0 %    MCV 78.3  78.0 - 100.0 fL    MCH 25.8 (*) 26.0 - 34.0 pg    MCHC 33.0  30.0 - 36.0 g/dL    RDW 91.4  78.2 - 95.6 %    Platelets 170  150 - 400 K/uL   PROTIME-INR     Status: Normal   Collection Time   10/02/12  7:02 AM      Component Value Range Comment   Prothrombin Time 14.0  11.6 - 15.2 seconds    INR 1.09  0.00 - 1.49    No results found.  Assessment/Plan Mediastinal mass For CT guided biopsy today Discussed procedure and risks. Labs reviewed. Ordered serum Hcg as pt unsure of last  menses, but does rake Depoprovera. Consent signed in chart.  Brayton El PA-C 10/02/2012, 8:26 AM

## 2012-10-02 NOTE — Procedures (Signed)
Ant med mass Core 18 g times 8 No comp

## 2012-10-17 ENCOUNTER — Encounter: Payer: PRIVATE HEALTH INSURANCE | Admitting: Surgery

## 2012-10-31 ENCOUNTER — Encounter: Payer: Self-pay | Admitting: Surgery

## 2012-10-31 ENCOUNTER — Institutional Professional Consult (permissible substitution): Payer: PRIVATE HEALTH INSURANCE | Admitting: Surgery

## 2012-10-31 ENCOUNTER — Emergency Department: Payer: Self-pay | Admitting: Emergency Medicine

## 2012-10-31 NOTE — Progress Notes (Unsigned)
This encounter was created in error - please disregard.

## 2012-11-01 NOTE — Progress Notes (Signed)
Pt NO Showed for her scheduled appt

## 2013-01-20 ENCOUNTER — Emergency Department: Payer: Self-pay | Admitting: Emergency Medicine

## 2013-04-21 ENCOUNTER — Inpatient Hospital Stay: Payer: Self-pay | Admitting: Internal Medicine

## 2013-04-21 LAB — URINALYSIS, COMPLETE
Bacteria: NONE SEEN
Ph: 5 (ref 4.5–8.0)
Protein: NEGATIVE
Squamous Epithelial: 13
WBC UR: 21 /HPF (ref 0–5)

## 2013-04-21 LAB — CBC
HGB: 10.9 g/dL — ABNORMAL LOW (ref 12.0–16.0)
MCHC: 32.7 g/dL (ref 32.0–36.0)
RBC: 4.25 10*6/uL (ref 3.80–5.20)
RDW: 13.5 % (ref 11.5–14.5)
WBC: 9 10*3/uL (ref 3.6–11.0)

## 2013-04-21 LAB — BASIC METABOLIC PANEL
Co2: 27 mmol/L (ref 21–32)
Glucose: 126 mg/dL — ABNORMAL HIGH (ref 65–99)
Sodium: 139 mmol/L (ref 136–145)

## 2013-04-21 LAB — HEPATIC FUNCTION PANEL A (ARMC)
Albumin: 2.7 g/dL — ABNORMAL LOW (ref 3.4–5.0)
SGOT(AST): 42 U/L — ABNORMAL HIGH (ref 15–37)
Total Protein: 7.5 g/dL (ref 6.4–8.2)

## 2013-04-21 LAB — TSH: Thyroid Stimulating Horm: <0.01 u[IU]/mL — ABNORMAL LOW

## 2013-04-21 LAB — PRO B NATRIURETIC PEPTIDE: B-Type Natriuretic Peptide: 296 pg/mL — ABNORMAL HIGH (ref 0–125)

## 2013-04-21 LAB — TROPONIN I: Troponin-I: <0.02 ng/mL

## 2013-04-21 LAB — HCG, QUANTITATIVE, PREGNANCY: Beta Hcg, Quant.: 1 m[IU]/mL

## 2013-04-22 LAB — LIPID PANEL
Cholesterol: 78 mg/dL (ref 0–200)
HDL Cholesterol: 37 mg/dL — ABNORMAL LOW (ref 40–60)
Ldl Cholesterol, Calc: 26 mg/dL (ref 0–100)
VLDL Cholesterol, Calc: 15 mg/dL (ref 5–40)

## 2013-04-22 LAB — TROPONIN I: Troponin-I: <0.02 ng/mL

## 2013-04-22 LAB — HEMOGLOBIN A1C: Hemoglobin A1C: 5.1 % (ref 4.2–6.3)

## 2013-06-21 ENCOUNTER — Emergency Department: Payer: Self-pay | Admitting: Emergency Medicine

## 2013-07-16 ENCOUNTER — Emergency Department: Payer: Self-pay | Admitting: Emergency Medicine

## 2013-07-16 LAB — COMPREHENSIVE METABOLIC PANEL
Anion Gap: 3 — ABNORMAL LOW (ref 7–16)
BUN: 11 mg/dL (ref 7–18)
Bilirubin,Total: 0.4 mg/dL (ref 0.2–1.0)
Chloride: 108 mmol/L — ABNORMAL HIGH (ref 98–107)
Co2: 28 mmol/L (ref 21–32)
Creatinine: 0.45 mg/dL — ABNORMAL LOW (ref 0.60–1.30)
EGFR (Non-African Amer.): >60
Glucose: 148 mg/dL — ABNORMAL HIGH (ref 65–99)
Potassium: 3.6 mmol/L (ref 3.5–5.1)

## 2013-07-16 LAB — CBC
MCH: 26.1 pg (ref 26.0–34.0)
MCHC: 33.4 g/dL (ref 32.0–36.0)
MCV: 78 fL — ABNORMAL LOW (ref 80–100)
Platelet: 162 10*3/uL (ref 150–440)
RDW: 13.7 % (ref 11.5–14.5)
WBC: 4.9 10*3/uL (ref 3.6–11.0)

## 2013-07-16 LAB — URINALYSIS, COMPLETE
Blood: NEGATIVE
Ketone: NEGATIVE
Protein: NEGATIVE
Specific Gravity: 1.014 (ref 1.003–1.030)
WBC UR: <1 /HPF (ref 0–5)

## 2013-09-11 ENCOUNTER — Emergency Department: Payer: Self-pay | Admitting: Emergency Medicine

## 2014-02-06 ENCOUNTER — Emergency Department: Payer: Self-pay | Admitting: Emergency Medicine

## 2014-02-07 LAB — COMPREHENSIVE METABOLIC PANEL
ALBUMIN: 2.7 g/dL — AB (ref 3.4–5.0)
ALK PHOS: 195 U/L — AB
AST: 13 U/L — AB (ref 15–37)
Anion Gap: 7 (ref 7–16)
BUN: 10 mg/dL (ref 7–18)
Bilirubin,Total: 0.5 mg/dL (ref 0.2–1.0)
Calcium, Total: 8.8 mg/dL (ref 8.5–10.1)
Chloride: 106 mmol/L (ref 98–107)
Co2: 26 mmol/L (ref 21–32)
Creatinine: 0.6 mg/dL (ref 0.60–1.30)
EGFR (African American): >60
EGFR (Non-African Amer.): >60
Glucose: 104 mg/dL — ABNORMAL HIGH (ref 65–99)
Osmolality: 277 (ref 275–301)
Potassium: 3.4 mmol/L — ABNORMAL LOW (ref 3.5–5.1)
SGPT (ALT): 20 U/L (ref 12–78)
Sodium: 139 mmol/L (ref 136–145)
TOTAL PROTEIN: 7.7 g/dL (ref 6.4–8.2)

## 2014-02-07 LAB — CBC
HCT: 33.4 % — ABNORMAL LOW (ref 35.0–47.0)
HGB: 10.8 g/dL — AB (ref 12.0–16.0)
MCH: 25.6 pg — AB (ref 26.0–34.0)
MCHC: 32.4 g/dL (ref 32.0–36.0)
MCV: 79 fL — AB (ref 80–100)
Platelet: 184 10*3/uL (ref 150–440)
RBC: 4.22 10*6/uL (ref 3.80–5.20)
RDW: 13.2 % (ref 11.5–14.5)
WBC: 7 10*3/uL (ref 3.6–11.0)

## 2014-05-10 IMAGING — CR DG CHEST 2V
1 series · 2 of 2 positions shown · non-contrast
Comparison: none

REASON FOR EXAM: Chest Pain
COMMENTS:

PROCEDURE:     DXR - DXR CHEST PA (OR AP) AND LATERAL  - April 21, 2013  [DATE]
RESULT:     Comparison: None

[Series 1: w chest pa · 0.14mm/px · 2 of 2 slices shown]
[im 1/2]
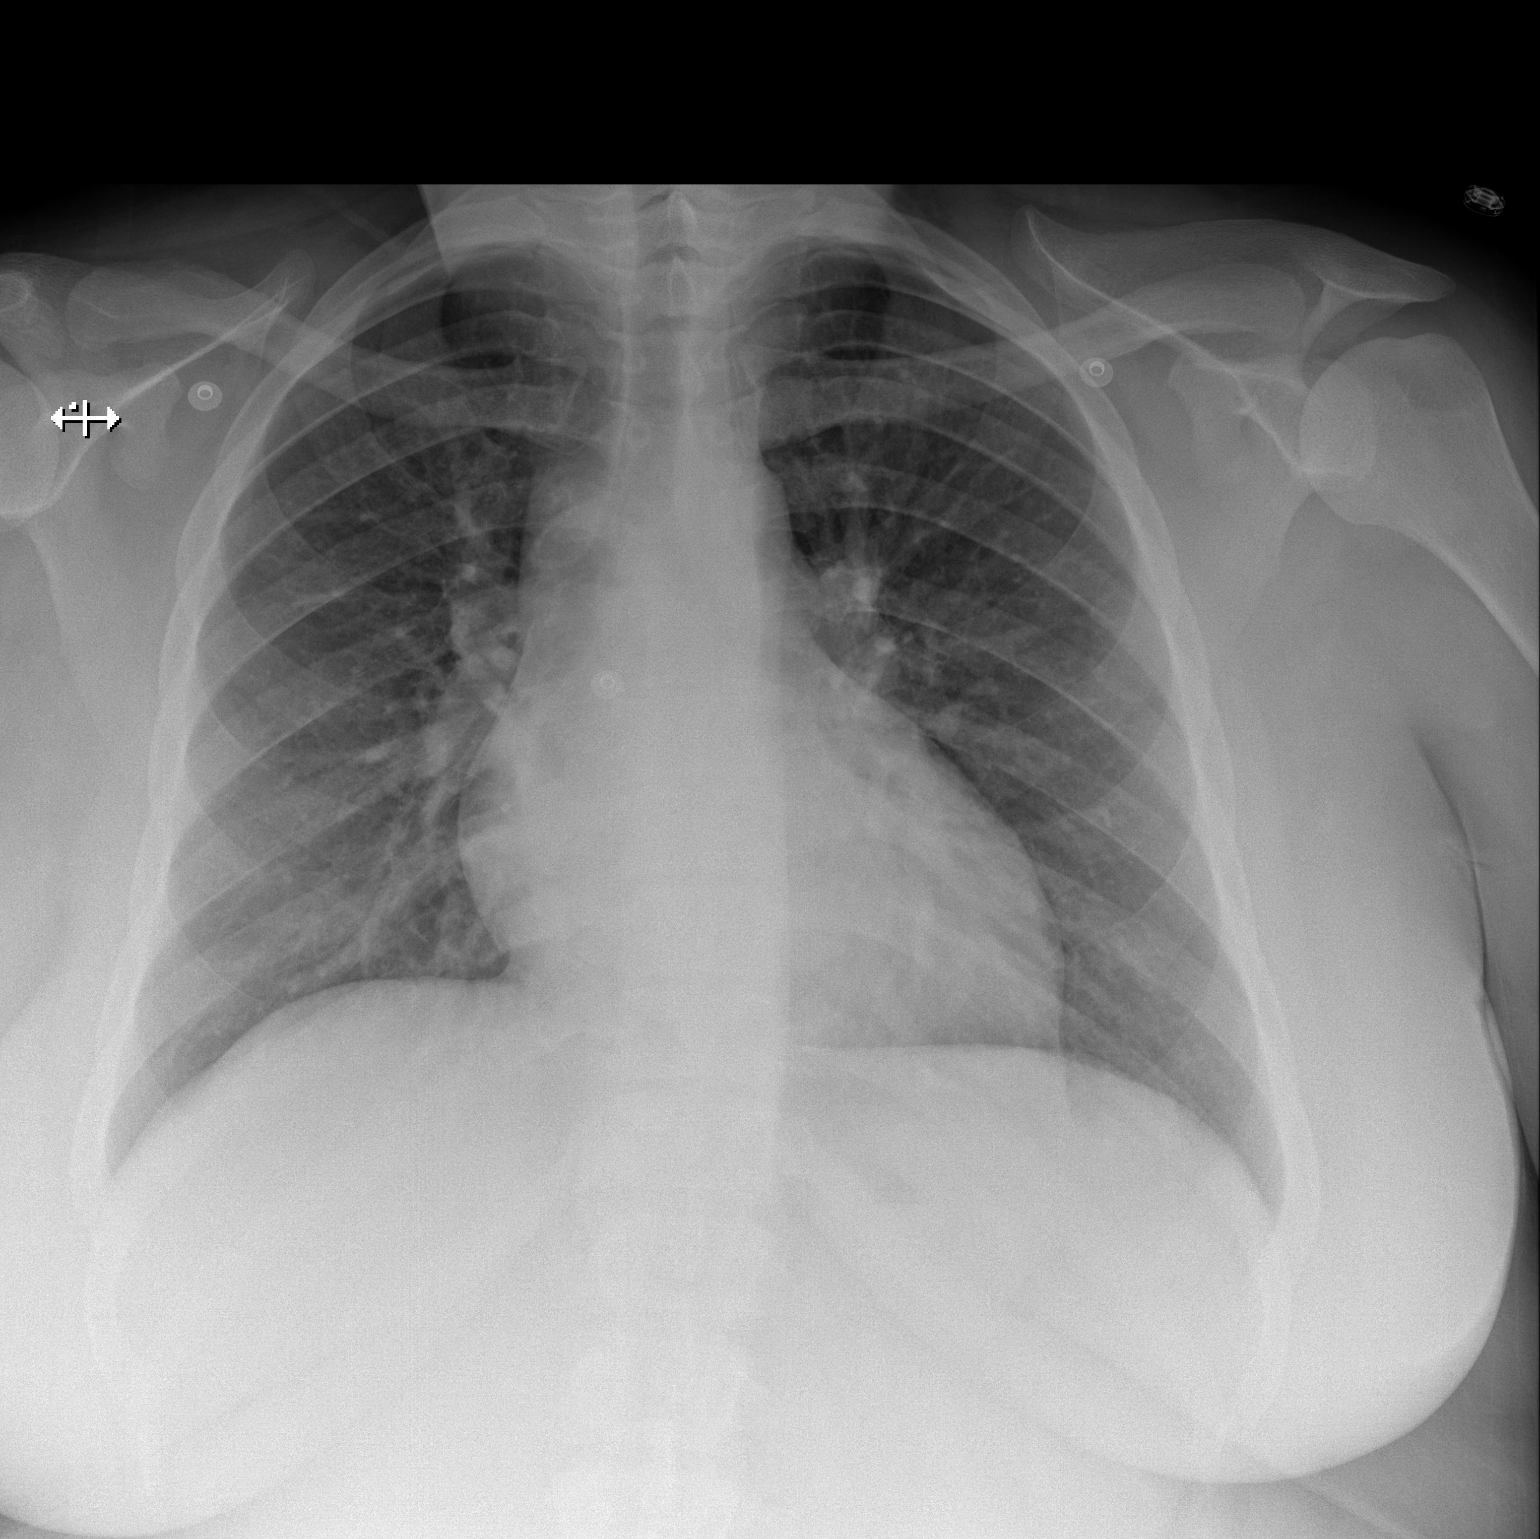
[im 2/2]
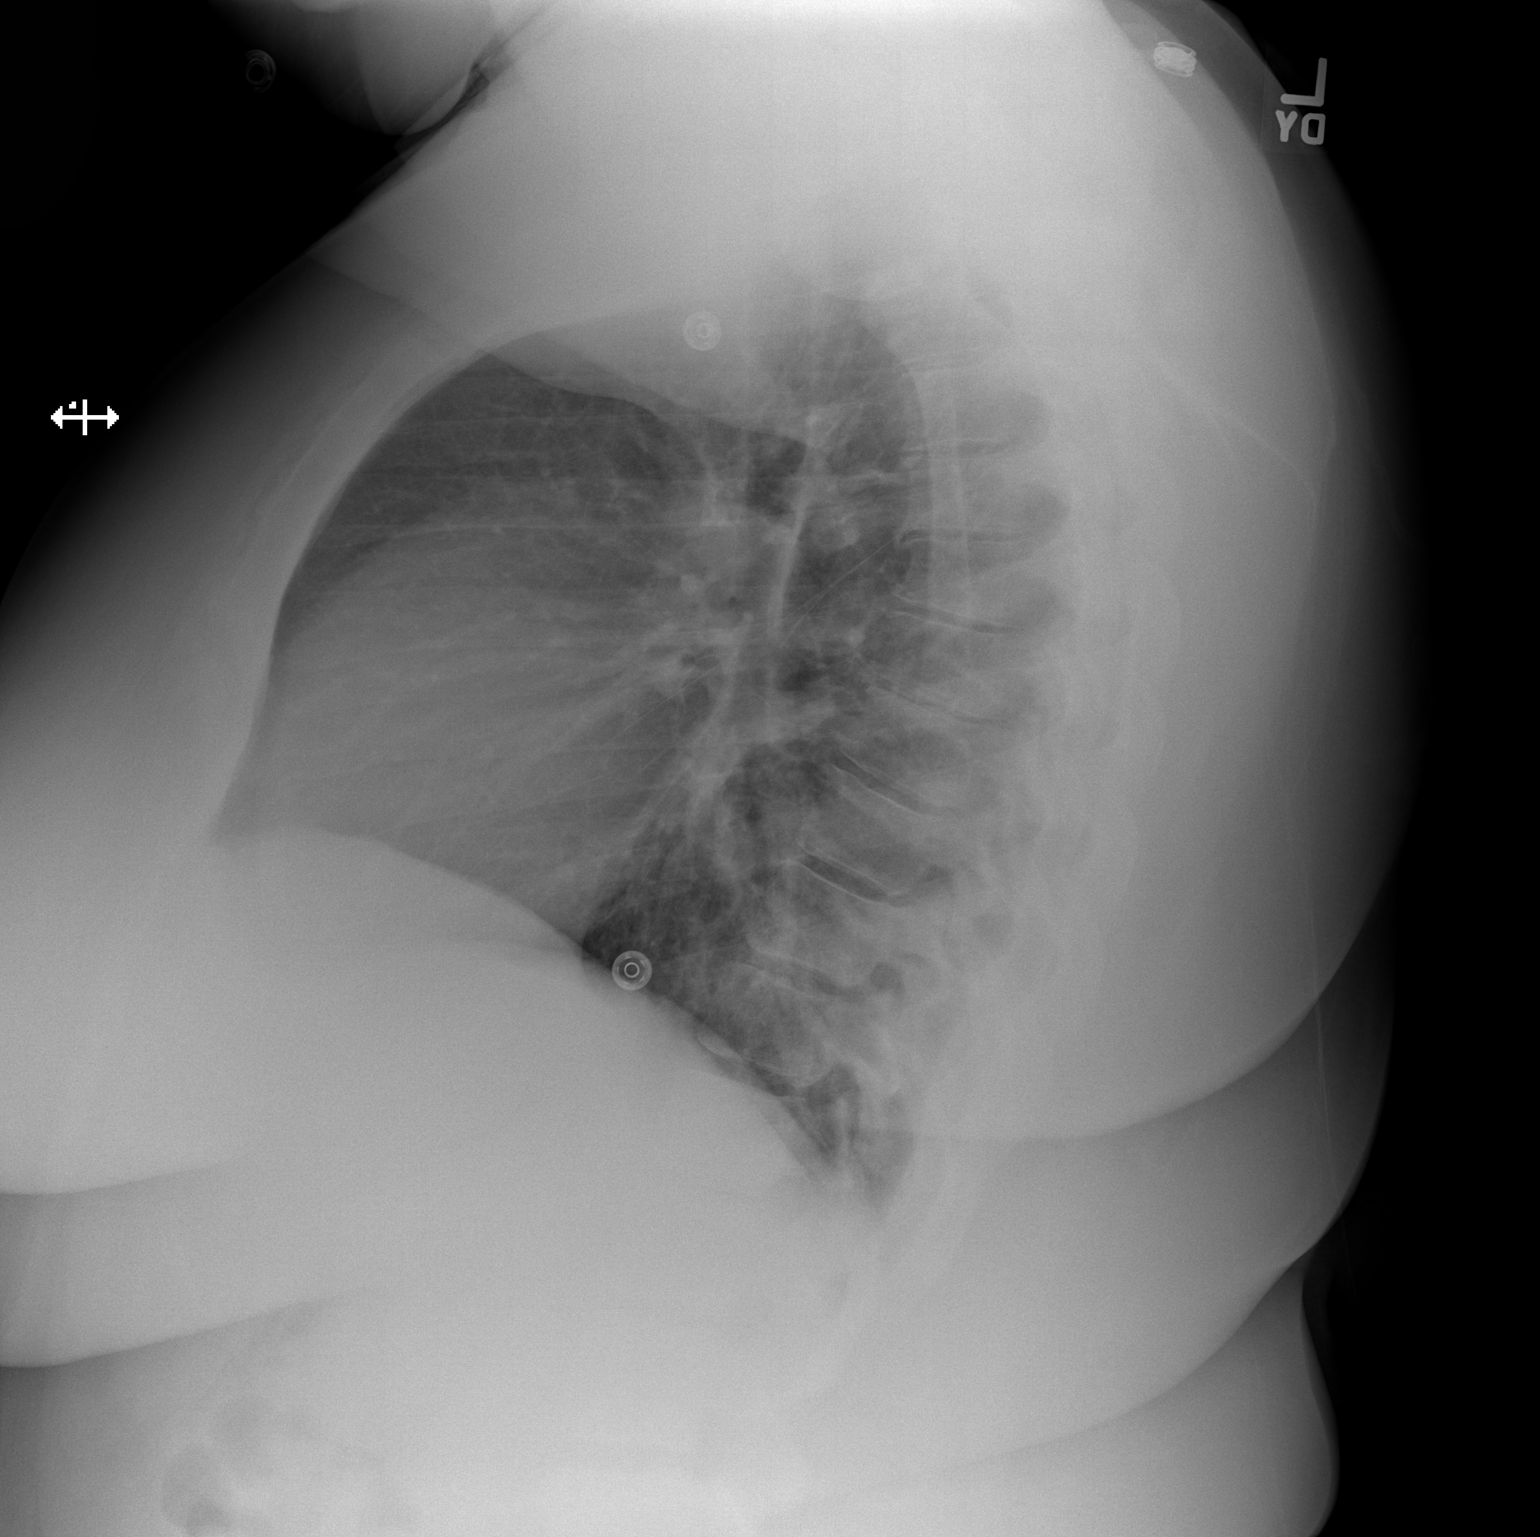

[2 of 2 positions shown; findings below may reference images not displayed]

FINDINGS: PA and lateral chest radiographs are provided.  There is no focal
parenchymal opacity, pleural effusion, or pneumothorax. The heart and
mediastinum are unremarkable.  The osseous structures are unremarkable.
IMPRESSION: No acute disease of the che[REDACTED]

## 2014-07-02 LAB — COMPREHENSIVE METABOLIC PANEL
ALBUMIN: 2.8 g/dL — AB (ref 3.4–5.0)
ANION GAP: 7 (ref 7–16)
Alkaline Phosphatase: 229 U/L — ABNORMAL HIGH
BUN: 9 mg/dL (ref 7–18)
Bilirubin,Total: 0.4 mg/dL (ref 0.2–1.0)
CALCIUM: 8.4 mg/dL — AB (ref 8.5–10.1)
CHLORIDE: 106 mmol/L (ref 98–107)
CO2: 27 mmol/L (ref 21–32)
CREATININE: 0.5 mg/dL — AB (ref 0.60–1.30)
EGFR (Non-African Amer.): >60
Glucose: 128 mg/dL — ABNORMAL HIGH (ref 65–99)
Osmolality: 280 (ref 275–301)
POTASSIUM: 3.7 mmol/L (ref 3.5–5.1)
SGOT(AST): 25 U/L (ref 15–37)
SGPT (ALT): 24 U/L
SODIUM: 140 mmol/L (ref 136–145)
Total Protein: 8.1 g/dL (ref 6.4–8.2)

## 2014-07-02 LAB — CBC WITH DIFFERENTIAL/PLATELET
BASOS ABS: 0 10*3/uL (ref 0.0–0.1)
BASOS PCT: 0.3 %
EOS ABS: 0.2 10*3/uL (ref 0.0–0.7)
EOS PCT: 2 %
HCT: 36.9 % (ref 35.0–47.0)
HGB: 11.9 g/dL — ABNORMAL LOW (ref 12.0–16.0)
Lymphocyte #: 2.3 10*3/uL (ref 1.0–3.6)
Lymphocyte %: 27.2 %
MCH: 25.6 pg — ABNORMAL LOW (ref 26.0–34.0)
MCHC: 32.1 g/dL (ref 32.0–36.0)
MCV: 80 fL (ref 80–100)
MONO ABS: 0.7 x10 3/mm (ref 0.2–0.9)
Monocyte %: 8 %
Neutrophil #: 5.4 10*3/uL (ref 1.4–6.5)
Neutrophil %: 62.5 %
Platelet: 165 10*3/uL (ref 150–440)
RBC: 4.64 10*6/uL (ref 3.80–5.20)
RDW: 12.7 % (ref 11.5–14.5)
WBC: 8.6 10*3/uL (ref 3.6–11.0)

## 2014-07-02 LAB — T4, FREE: Free Thyroxine: 5.83 ng/dL — ABNORMAL HIGH (ref 0.76–1.46)

## 2014-07-02 LAB — TSH: Thyroid Stimulating Horm: <0.01 u[IU]/mL — ABNORMAL LOW

## 2014-07-03 ENCOUNTER — Inpatient Hospital Stay: Payer: Self-pay | Admitting: Internal Medicine

## 2014-07-03 DIAGNOSIS — R0602 Shortness of breath: Secondary | ICD-10-CM

## 2014-07-05 ENCOUNTER — Emergency Department: Payer: Self-pay | Admitting: Emergency Medicine

## 2014-09-23 ENCOUNTER — Emergency Department: Payer: Self-pay | Admitting: Emergency Medicine

## 2014-09-28 ENCOUNTER — Emergency Department: Payer: Self-pay | Admitting: Emergency Medicine

## 2014-10-01 LAB — BETA STREP CULTURE(ARMC)

## 2015-02-27 DIAGNOSIS — Z6841 Body Mass Index (BMI) 40.0 and over, adult: Secondary | ICD-10-CM

## 2015-02-27 DIAGNOSIS — F17200 Nicotine dependence, unspecified, uncomplicated: Secondary | ICD-10-CM | POA: Insufficient documentation

## 2015-03-07 NOTE — H&P (Signed)
PATIENT NAME:  Kimberly Nolan, Kimberly K MR#:  [id number] DATE OF BIRTH:  1986/03/21  DATE OF ADMISSION:  04/21/2013  PRIMARY CARE PHYSICIAN: None.   REFERRING EMERGENCY ROOM PHYSICIAN: Dr. Governor Rooksebecca Lord.  CHIEF COMPLAINT: Chest tightness.   HISTORY OF PRESENT ILLNESS: The patient is a 29 year old female with past medical history of hypertension, morbidly obese and she is a smoker, who presented to Emergency Room with complaint of burning in the chest that started this morning. She said that she had similar episode in the last 1 or 2 days where she feels palpitations and feels some heaviness in the chest and also feels like shortness of breath at this time. On further evaluation in ER, her troponin was negative. EKG was tachycardia, but otherwise noncontributory, but her TSH level was significantly low and her FT4 was high, and so she is being admitted for further management of thyrotoxicosis. On further questioning, the patient denies any complaints of fatigue or weakness or weight loss. She denies any anxiety episode. She said that she has heavy, but regular, menstruation for the past 5 to 6 months. She denied any tremor, heat or cold intolerance also.   REVIEW OF SYSTEMS: CONSTITUTIONAL: No fever, fatigue, weakness, weight gain or weight loss. EYES: No blurring or double vision or redness. EARS, NOSE, THROAT: No tinnitus, ear pain, hearing loss. RESPIRATORY: No cough, wheezing, hemoptysis or shortness of breath. CARDIOVASCULAR: No chest pain, orthopnea or edema. Mild chest tightness was there and she has feeling of palpitations with that episode. GASTROINTESTINAL: No nausea, vomiting, diarrhea or abdominal pain. No constipation. GENITOURINARY: No dysuria, hematuria or increased frequency. ENDOCRINOLOGY: Denies any polyhydrea, nocturia or heart or cold intolerance. GYNECOLOGIC: Had heavy menstruation for the last 5 to 6 months, but they are regular. MUSCULOSKELETAL: Denies any pain or swelling in the joints.   NEUROLOGICAL: No numbness, weakness, dysarthria or tremors. PSYCHIATRIC: No anxiety, insomnia or bipolar disorder.   PAST MEDICAL HISTORY: Hypertension and iron deficiency anemia.   PAST SURGICAL HISTORY: None.   HOME MEDICATIONS: Hydrochlorothiazide and iron supplements.   SOCIAL HISTORY: She works in [employer]. She is a smoker, smokes 4 to 5 cigarettes daily and drinking alcohol socially. Denies any drug use.   FAMILY HISTORY: Positive for some thyroid disease in her mother. She does not know what exactly it was.   PHYSICAL EXAMINATION:  VITAL SIGNS: In the ER, temperature 98.8, pulse rate 125, respirations 34, blood pressure 184/88 and pulse oximetry 100% on room air.  GENERAL: Morbidly obese, fully alert and oriented to time, place and person, not in any acute distress, cooperative with history taking and physical examination.  HEENT: Atraumatic. Conjunctivae pink.  NECK: Supple. Enlarged thyroid gland present. Nontender. No JVD.  RESPIRATORY: Bilateral equal and clear air entry.  CARDIOVASCULAR: S1, S2 present, regular, tachycardia. No murmur.  ABDOMEN: Obese, soft, nontender. Bowel sounds present.  SKIN: No rashes.  EXTREMITIES: Bilateral chronic edema present due to obesity.  NEUROLOGICAL: No tremors. Power 5 over 5 in all 4 limbs.   IMPORTANT LAB RESULTS: Glucose 126, BNP 296, BUN 9, creatinine 0.50, sodium 139, potassium 3.6, chloride 106, CO2 27. Troponin less than 0.02. Thyroxine-3, 5.35 and thyroid stimulating hormone less than 0.01. WBC count 9000, hemoglobin 10.9, platelet count 188, MCV 78. D-dimer 0.58. Urinalysis is positive with yellow hazy urine with trace leukocyte esterase and 21 WBCs.   ASSESSMENT AND PLAN: A 29 year old female, who presented with chest tightness and palpitation episode and found having hypothyroidism.  1.  Grave's thyrotoxicosis, mild  symptoms. Will admit to telemetry. Currently she is tachycardic and tachypneic, but no tremors or anxiety episode, but  she has heavy menstruation for the last few months. ER physician gave first dose of Inderal and methimazole. We will continue the treatment. Would also check her liver function panel as she is being started on methimazole. Pregnancy test is negative checked by ER. We will try to get in touch with Dr. Tedd Sias, endocrinologist for further management recommendation and discharge planning.  2.  Hypertension. We will continue hydrochlorothiazide as she is taking at home and she is on propranolol for her thyrotoxicosis.  3.  Iron deficiency anemia. Will continue ferrous sulfate as she was taking at home.  4.  Urinary tract infection. UA is positive. She does not have symptoms, but we will treat with Levaquin and get urine culture.  5.  Tobacco abuse disorder smoker. Smoking cessation counseling done for 5 minutes and offered nicotine patch.   TOTAL TIME SPENT ON THIS ADMISSION: 50 minutes.   ____________________________ Hope Pigeon Elisabeth Pigeon, MD [id number] D: 04/21/2013 11:19:07 ET T: 04/21/2013 11:32:11 ET JOB#: [id number]  cc: Hope Pigeon. Elisabeth Pigeon, MD, <Dictator> A. Wendall Mola, MD [facility] MD ELECTRONICALLY SIGNED 04/30/2013 22:16

## 2015-03-07 NOTE — Discharge Summary (Signed)
PATIENT NAME:  Kimberly Nolan, Kimberly K MR#:  [id number] DATE OF BIRTH:  Feb 23, 1986  DATE OF ADMISSION:  04/21/2013 DATE OF DISCHARGE:  04/22/2013  DISCHARGE DIAGNOSES: 1.  Hyperthyroidism.  2.  Hypertension.  3.  Urinary tract infection.  4.  Smoker.   CONDITION ON DISCHARGE:  Stable.   CODE STATUS:  FULL CODE.   MEDICATIONS ON DISCHARGE:   1.  Propranolol 60 mg oral capsule extended release once a day.  2.  Methimazole 10 mg oral tablet 2 times a day.  3.  Hydrochlorothiazide 25 mg oral tablet take 1/2 tablet once a day.  4.  Ferrous sulfate 325 mg oral tablet take twice a day.  5.  Levofloxacin 250 mg oral tablet take once a day for 3 days. 6.  Nicotine patch once a day.  DIET ON DISCHARGE:  Low sodium, low fat, low cholesterol, regular consistency diet.   TIMEFRAME TO FOLLOW-UP:  Within 1 to 2 weeks with Dr. Gregery NaMorayati's office.    HISTORY OF PRESENT ILLNESS:  A 29 year old female with past medical history of hypertension, morbid obesity, smoker and iron deficiency anemia presented to the Emergency Room with complaining of burning in the chest.  She says she had similar episode 1 to 2 days and she feels palpitations and feels some heaviness in her chest also.  Also associated with some shortness of breath.  On further evaluation in the ER, her troponins were negative.  EKG was only tachycardia, but otherwise noncontributory.  Her TSH level was significantly low and Free T4 was high and so she was being admitted for further management of thyrotoxicosis.  On further questioning, she also accepted having heavy menstruation which is still regular for the last few months, but denied any weight loss or heat or cold intolerance.   HOSPITAL COURSE AND STAY:  1.  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  The patient was started on pantoprazole and methimazole.  Because of weekend, it was hard to get in-hospital endocrinology consult.  I spoke to Dr. Patrecia PaceMorayati on the phone.  He agreed with  the plan and suggested to increase the methimazole dose to 10 mg twice daily due to very low TSH and high Free T4 level and he agreed to see the patient in the clinic.  2.  Hypertension, which was very well-controlled with hydrochlorothiazide.  As we started on pantoprazole, we decreased the dose of hydrochlorothiazide to half.  3.  Iron deficiency anemia.  Continued supplementation on discharge.  4.  Urinary tract infection.  UA was positive, but there were no symptoms.  We gave her Levaquin and she responded nicely.  5.  Tobacco abuse.  We continued nicotine patch in the hospital.   IMPORTANT LABORATORY RESULTS IN THE HOSPITAL:  WBC count 9000, hemoglobin 10.9, platelet count 188.  Creatinine 0.5, sodium 139, potassium 3.6.  Troponin less than 0.02.  TSH 5.35.  BNP was 296.  D-dimer 0.58.  Free T3 was 25.5.  Urinalysis is positive with 21 WBCs and trace leukocyte esterase.  TSH was less than 0.01 and free thyroxine three was 5.35.   Total time spent in this discharge is 45 minutes.     ____________________________ Hope PigeonVaibhavkumar G. Elisabeth PigeonVachhani, MD vgv:ea D: 04/25/2013 23:01:16 ET T: 04/26/2013 00:11:46 ET JOB#: [id number]  cc: Hope PigeonVaibhavkumar G. Elisabeth PigeonVachhani, MD, <Dictator> Alan MulderShamil J. Morayati, MD Altamese DillingVAIBHAVKUMAR Jya Hughston MD ELECTRONICALLY SIGNED 04/30/2013 22:18

## 2015-03-08 NOTE — Discharge Summary (Signed)
PATIENT NAME:  Kimberly Nolan, Kimberly K MR#:  Nolan DATE OF BIRTH:  1986/06/08  DISCHARGE DIAGNOSES: 1.  Hyperthyroidism.  2.  Tobacco abuse.  3.  Hypertension.   DISCHARGE MEDICATIONS:  1.  Lasix 20 mg oral once a day. 2.  Propranolol 40 mg oral 2 times a day. 3.  Methimazole 10 mg oral every 8 hours.   DISCHARGE INSTRUCTIONS: Low salt, low fat diet. Activity as tolerated. Follow up with Dr. Tedd SiasSolum in 1-2 weeks and Sandrea HughsJessica Rubio, NP, on 08/06/2014 at 2:00 p.m.   ADMITTING HISTORY AND PHYSICAL AND HOSPITAL COURSE: Please see detailed H and P dictated by Dr. Sheryle Hailiamond. In brief, a 29 year old, morbidly obese African American female patient with history of hyperthyroidism in the past, not on any therapy. Presented to the hospital complaining of some neck pain. The patient was found to have some vague cervicalgia, thought to be musculoskeletal, but also tachycardic into the 140s with very low TSH, elevated T4, admitted to hospitalist service.   HOSPITAL COURSE: Hyperthyroidism. The patient has had this problem for a while. She was started on propranolol and methimazole with which her heart rate improved to the 90s along with some IV fluids. The patient felt normal. Her neck pain had resolved. She is ambulating well. No neurological deficits prior to discharge.  S1, S2 heard without any murmurs. Echocardiogram  was done which was normal. Lungs sounded clear. She does have some mild goiter. She has been set up appointment with Dr. Tedd SiasSolum and she will follow up with her in 1-2 weeks. I have also counseled her to quit smoking, for greater than 3 minutes.   Discharge time spent on day of discharge was 40 minutes.    ____________________________ Molinda BailiffSrikar R. Jakyiah Briones, MD srs:LT D: 07/03/2014 14:18:00 ET T: 07/03/2014 16:19:11 ET JOB#: [id number]  cc: Wardell HeathSrikar R. Samhita Kretsch, MD, <Dictator> Orie FishermanSRIKAR R Trudi Morgenthaler MD ELECTRONICALLY SIGNED 08/03/2014 14:30

## 2015-03-08 NOTE — H&P (Signed)
PATIENT NAME:  Kimberly DownsFOUST, Hibah K MR#:  [id number] DATE OF BIRTH:  1986-06-18  DATE OF ADMISSION:  07/03/2014  REFERRING PHYSICIAN: Enedina Finnerandolph N. Manson PasseyBrown, MD  PRIMARY CARE PHYSICIAN: Nonlocal.   ADMIT DIAGNOSIS: Hyperthyroidism and tachycardia.   HISTORY OF PRESENT ILLNESS: This is a 29 year old African American female who presents to the Emergency Department complaining that her neck hurts. The patient states it has been hurting for months. She admits to dysphagia but denies nausea, vomiting, diarrhea, constipation, chest pain, or shortness of breath. The patient knows that she has a diagnosis of hyperthyroidism but has not sought treatment due to not having insurance. Emergency Department staff called for admission once laboratory evaluation revealed suppression of her thyroid stimulating hormone.  REVIEW OF SYSTEMS: CONSTITUTIONAL: The patient denies fever or weakness.  EYES: The patient denies double vision or inflammation.  ENT: The patient denies nosebleeds. She admits to difficulty swallowing sometimes.  RESPIRATORY: The patient denies cough or wheezing.  CARDIOVASCULAR: The patient denies chest pain, orthopnea, or palpitations.  GASTROINTESTINAL: The patient denies abdominal pain or vomiting.  GENITOURINARY: The patient denies dysuria, increased frequency, or hesitancy.  ENDOCRINE: The patient denies nocturia or polyuria. HEMATOLOGIC AND LYMPHATIC: The patient denies bleeding or easy bruising.  INTEGUMENTARY: The patient denies rash or lesions.  MUSCULOSKELETAL: The patient denies arthralgias or myalgias.  NEUROLOGIC: The patient denies numbness or weakness or difficulty speaking.  PSYCHIATRIC: The patient denies depression or suicidal ideation.   PAST MEDICAL HISTORY: Significant for hyperthyroidism.   SURGICAL HISTORY: The patient has had a C-section.   FAMILY HISTORY: Her mother, maternal grandmother, and maternal aunt all have thyroid disease. Her grandmother and aunt on both her  mother's and father's side have diabetes type 2.   SOCIAL HISTORY: The patient smokes 4-5 cigarettes per day intermittently, depending on her stress levels. She denies alcohol or drug use.  MEDICATIONS: None  ALLERGIES: No known drug allergies  PERTINENT LABORATORY RESULTS AND RADIOGRAPHIC FINDINGS: Glucose is 128, BUN 9, creatinine 0.5, calcium is 8.4, albumin is 2.8. Alkaline phosphatase is 229. AST and ALT are normal. Thyroid stimulating hormone is less than 0.01. Free thyroxine is 5.83. Hemoglobin is 11.9, hematocrit is 36.9.  PHYSICAL EXAMINATION:  VITAL SIGNS: Temperature is 98.7, pulse 118, respirations 28, blood pressure 158/82, pulse oximetry 99% on room air.  GENERAL: The patient is alert and oriented x 3, in no apparent distress.  HEENT: Normocephalic, atraumatic. Pupils equal, round, and reactive to light and accommodation. Extraocular movements are intact. Mucous membranes are moist. There is no erythema or exudate in the oropharynx.  NECK: Trachea is midline. No adenopathy.  CHEST: Symmetric and atraumatic.  CARDIOVASCULAR: Tachycardic with normal S1, S2. No rubs, clicks, or murmurs.  LUNGS: Clear to auscultation bilaterally. Normal effort and excursion.  ABDOMEN: Positive bowel sounds, soft, nontender, nondistended. No hepatosplenomegaly.  GENITOURINARY: Deferred.  MUSCULOSKELETAL: The patient moves all 4 extremities equally.  SKIN: No rashes or lesions.  EXTREMITIES: No clubbing or cyanosis. There is trace edema and some discoloration of the ankles, indicating intermittent edema.  NEUROLOGIC: Cranial nerves II through XII are grossly intact.  PSYCHIATRIC: Mood is normal. Affect is congruent.   ASSESSMENT AND PLAN: This is a 29 year old female with uncontrolled hyperthyroidism. 1.  Hyperthyroidism. We will start methimazole. The patient is not in thyroid storm. There is no neurologic change, fever, or leukocytosis. Her neck is tender to touch. There has been no antecedent  illness and the thyroid is firm but mobile. It does not appear that she  has thyroiditis at this time. The goiter is likely just growing in size and causing the patient's pain.  2.  Tachycardia. We will start a beta blocker, obtain an echocardiogram due to likely prolonged rapid heart rate.  3.  Obesity. Body mass index is 46.7. Place the patient on a calorie restricted diet.  4.  Deep vein thrombosis prophylaxis. Sequential compression devices.  5.  Gastrointestinal prophylaxis. Unnecessary, as the patient is not critically ill.  CODE STATUS: The patient is a full code.  TIME SPENT ON ADMISSION ORDERS AND PATIENT CARE: Approximately 40 minutes.    ____________________________ Kelton Pillar. Sheryle Hail, MD msd:ST D: 07/03/2014 01:41:27 ET T: 07/03/2014 02:09:49 ET JOB#: [id number]  cc: Kelton Pillar. Sheryle Hail, MD, <Dictator> Kelton Pillar Sharmane Dame MD ELECTRONICALLY SIGNED 07/03/2014 8:14

## 2015-03-30 ENCOUNTER — Emergency Department
Admission: EM | Admit: 2015-03-30 | Discharge: 2015-03-30 | Disposition: A | Discharge status: Home / Self Care | Payer: Medicaid Other | Attending: Emergency Medicine | Admitting: Emergency Medicine

## 2015-03-30 ENCOUNTER — Emergency Department: Payer: Medicaid Other

## 2015-03-30 ENCOUNTER — Encounter: Payer: Self-pay | Admitting: Emergency Medicine

## 2015-03-30 DIAGNOSIS — Z792 Long term (current) use of antibiotics: Secondary | ICD-10-CM | POA: Diagnosis not present

## 2015-03-30 DIAGNOSIS — Z79899 Other long term (current) drug therapy: Secondary | ICD-10-CM | POA: Diagnosis not present

## 2015-03-30 DIAGNOSIS — M47816 Spondylosis without myelopathy or radiculopathy, lumbar region: Secondary | ICD-10-CM

## 2015-03-30 DIAGNOSIS — R52 Pain, unspecified: Secondary | ICD-10-CM

## 2015-03-30 DIAGNOSIS — M545 Low back pain: Secondary | ICD-10-CM | POA: Diagnosis present

## 2015-03-30 DIAGNOSIS — Z720 Tobacco use: Secondary | ICD-10-CM | POA: Insufficient documentation

## 2015-03-30 DIAGNOSIS — M4696 Unspecified inflammatory spondylopathy, lumbar region: Secondary | ICD-10-CM | POA: Insufficient documentation

## 2015-03-30 MED ORDER — MELOXICAM 15 MG PO TABS: 15.0000 mg | ORAL_TABLET | Freq: Every day | ORAL | Status: DC

## 2015-03-30 MED ORDER — METHOCARBAMOL 500 MG PO TABS
ORAL_TABLET | ORAL | Status: AC
  Administered 2015-03-30: 1000 mg via ORAL
  Filled 2015-03-30: qty 2

## 2015-03-30 MED ORDER — KETOROLAC TROMETHAMINE 10 MG PO TABS
ORAL_TABLET | ORAL | Status: AC
  Administered 2015-03-30: 20 mg via ORAL
  Filled 2015-03-30: qty 2

## 2015-03-30 MED ORDER — KETOROLAC TROMETHAMINE 10 MG PO TABS
20.0000 mg | ORAL_TABLET | Freq: Once | ORAL | Status: AC
  Administered 2015-03-30: 20 mg via ORAL

## 2015-03-30 MED ORDER — METHOCARBAMOL 500 MG PO TABS
1000.0000 mg | ORAL_TABLET | Freq: Once | ORAL | Status: AC
  Administered 2015-03-30: 1000 mg via ORAL

## 2015-03-30 NOTE — ED Notes (Signed)
C/o lower back pain x 1 months, denies any injury

## 2015-03-30 NOTE — Discharge Instructions (Signed)
Take medications as directed and follow up with Family Doctor for continual care.

## 2015-03-30 NOTE — ED Provider Notes (Signed)
[facility] Emergency Department Provider Note  ____________________________________________  Time seen: Approximately 6:41 PM  I have reviewed the triage vital signs and the nursing notes.   HISTORY  Chief Complaint Back Pain    HPI Kimberly Nolan is a 29 y.o. female complaining of low back pain for 1 month. Patient stated this with increasing back pain for 1 month. Pain is located in the lower center of her back. 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 She denies any provocative incident. Patient is rating the pain as a 7/10. She denies any radicular component to this complaint. Patient also has denied any urinary problems.   Past Medical History  Diagnosis Date  . Thyroid disease     Patient Active Problem List   Diagnosis Date Noted  . Shortness of breath 09/13/2012  . CAP (community acquired pneumonia) 09/13/2012  . 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 Leukocytosis 09/13/2012  . Palpitations 09/13/2012    Past Surgical History  Procedure Laterality Date  . Cesarean section      Current Outpatient Rx  Name  Route  Sig  Dispense  Refill  . IRON PO   Oral   Take 1 tablet by mouth daily.          Marland Kitchen. levofloxacin (LEVAQUIN) 500 MG tablet   Oral   Take 1 tablet (500 mg total) by mouth daily.   14 tablet   0   . medroxyPROGESTERone (DEPO-PROVERA) 150 MG/ML injection   Intramuscular   Inject 150 mg into the muscle every 3 (three) months.         . meloxicam (MOBIC) 15 MG tablet   Oral   Take 1 tablet (15 mg total) by mouth daily.   30 tablet   2     Allergies Review of patient's allergies indicates no known allergies.  No family history on file.  Social History History  Substance Use Topics  . Smoking status: Current Every Day Smoker -- 0.10 packs/day for 3 years    Types: Cigarettes  . Smokeless tobacco: Never Used  . Alcohol Use: No    Review of Systems Constitutional: No fever/chills Eyes: No  visual changes. ENT: No sore throat. Cardiovascular: Denies chest pain. Respiratory: Denies shortness of breath. Gastrointestinal: No abdominal pain.  No nausea, no vomiting.  No diarrhea.  No constipation. Genitourinary: Negative for dysuria. Musculoskeletal: Positive for back pain. Skin: Negative for rash. Neurological: Negative for headaches, focal weakness or numbness. { 10-point ROS otherwise negative.  ____________________________________________   PHYSICAL EXAM:  VITAL SIGNS: ED Triage Vitals  Enc Vitals Group     BP 03/30/15 1729 172/97 mmHg     Pulse Rate 03/30/15 1729 101     Resp 03/30/15 1729 18     Temp 03/30/15 1729 98.3 F (36.8 C)     Temp Source 03/30/15 1729 Oral     SpO2 03/30/15 1729 99 %     Weight 03/30/15 1729 326 lb (147.873 kg)     Height 03/30/15 1729 5\' 7"  (1.702 m)     Head Cir --      Peak Flow --      Pain Score 03/30/15 1730 7     Pain Loc --      Pain Edu? --      Excl. in GC? --     Constitutional: Alert and oriented. Appears in mild distress. Patient overweight. Eyes: Conjunctivae are normal. PERRL. EOMI. Head: Atraumatic. Nose: No  congestion/rhinnorhea. Mouth/Throat: Mucous membranes are moist.  Oropharynx non-erythematous. Neck: No stridor. Full nuchal range of motion of the neck Hematological/Lymphatic/Immunilogical: No cervical lymphadenopathy. Cardiovascular: Normal rate, regular rhythm. Grossly normal heart sounds.  Good peripheral circulation. Blood pressure elevated 172/97 Respiratory: Normal respiratory effort.  No retractions. Lungs CTAB. Gastrointestinal: Soft and nontender. No distention. No abdominal bruits. No CVA tenderness. Musculoskeletal: No lower extremity tenderness nor edema.  No joint effusions. No spinal deformity. No CVA gotten. Tender to palpation L3-L5. Again note the patient is overweight. Neurologic:  Normal speech and language. No gross focal neurologic deficits are appreciated. Speech is normal. No gait  instability. Skin:  Skin is warm, dry and intact. No rash noted. Psychiatric: Mood and affect are normal. Speech and behavior are normal.  ____________________________________________   LABS (all labs ordered are listed, but only abnormal results are displayed)  Labs Reviewed - No data to display ____________________________________________  EKG   ____________________________________________  RADIOLOGY  DJD Lumbar spine ____________________________________________   PROCEDURES  Procedure(s) performed: None  Critical Care performed: No  ____________________________________________   INITIAL IMPRESSION / ASSESSMENT AND PLAN / ED COURSE  Pertinent labs & imaging results that were available during my care of the patient were reviewed by me and considered in my medical decision making (see chart for details).  Lumbar strain ____________________________________________   FINAL CLINICAL IMPRESSION(S) / ED DIAGNOSES  Final diagnoses:  Pain aggravated by activities of daily living  Arthritis, lumbar spine      Joni ReiningRonald K Allison Silva, PA-C 03/30/15 2036

## 2015-05-13 LAB — HM PAP SMEAR: HM PAP: NORMAL

## 2016-08-22 ENCOUNTER — Emergency Department
Admission: EM | Admit: 2016-08-22 | Discharge: 2016-08-22 | Disposition: A | Discharge status: Home / Self Care | Payer: Medicaid Other | Attending: Emergency Medicine | Admitting: Emergency Medicine

## 2016-08-22 ENCOUNTER — Encounter: Payer: Self-pay | Admitting: Emergency Medicine

## 2016-08-22 DIAGNOSIS — M5441 Lumbago with sciatica, right side: Secondary | ICD-10-CM | POA: Insufficient documentation

## 2016-08-22 DIAGNOSIS — M545 Low back pain: Secondary | ICD-10-CM | POA: Diagnosis present

## 2016-08-22 DIAGNOSIS — F17210 Nicotine dependence, cigarettes, uncomplicated: Secondary | ICD-10-CM | POA: Insufficient documentation

## 2016-08-22 DIAGNOSIS — M5431 Sciatica, right side: Secondary | ICD-10-CM

## 2016-08-22 MED ORDER — PREDNISONE 10 MG (21) PO TBPK: ORAL_TABLET | ORAL | 0 refills | Status: DC

## 2016-08-22 MED ORDER — CYCLOBENZAPRINE HCL 10 MG PO TABS: 10.0000 mg | ORAL_TABLET | Freq: Three times a day (TID) | ORAL | 0 refills | Status: DC | PRN

## 2016-08-22 NOTE — ED Notes (Signed)
NAD noted at time of D/C. Pt denies questions or concerns. Pt taken to the lobby via wheelchair at this time.  

## 2016-08-22 NOTE — ED Provider Notes (Signed)
[facility] Emergency Department Provider Note ____________________________________________  Time seen: Approximately 11:25 AM  I have reviewed the triage vital signs and the nursing notes.   HISTORY  Chief Complaint Back Pain    HPI Kimberly Nolan is a 30 y.o. female who presents to the emergency department for evaluation of lower back pain that radiates down the right lower extremity. She states that she's had this problem for the past year off and on. Symptoms worsened over the past week and have not been relieved with ibuprofen. She denies any loss of bowel or bladder control as well as saddle anesthesia.She states that she has been evaluated for this pain here in the emergency room as well as her primary care provider's office. She states that the primary care provider was supposed to submit a referral for orthopedics, but she has yet to hear anything about an appointment.  Past Medical History:  Diagnosis Date  . Thyroid disease     Patient Active Problem List   Diagnosis Date Noted  . Shortness of breath 09/13/2012  . CAP (community acquired pneumonia) 09/13/2012  . 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 Leukocytosis 09/13/2012  . Palpitations 09/13/2012    Past Surgical History:  Procedure Laterality Date  . CESAREAN SECTION      Prior to Admission medications   Medication Sig Start Date End Date Taking? Authorizing Provider  cyclobenzaprine (FLEXERIL) 10 MG tablet Take 1 tablet (10 mg total) by mouth 3 (three) times daily as needed for muscle spasms. 08/22/16   Tatsuo Musial B Marylene Masek, FNP  IRON PO Take 1 tablet by mouth daily.     Historical Provider, MD  levofloxacin (LEVAQUIN) 500 MG tablet Take 1 tablet (500 mg total) by mouth daily. 09/15/12   Alison MurrayAlma M Devine, MD  medroxyPROGESTERone (DEPO-PROVERA) 150 MG/ML injection Inject 150 mg into the muscle every 3 (three) months.    Historical Provider, MD  predniSONE (STERAPRED UNI-PAK 21 TAB) 10 MG (21) TBPK  tablet Take 6 tablets on day 1 Take 5 tablets on day 2 Take 4 tablets on day 3 Take 3 tablets on day 4 Take 2 tablets on day 5 Take 1 tablet on day 6 08/22/16   Chinita Pesterari B Biddie Sebek, FNP    Allergies Review of patient's allergies indicates no known allergies.  No family history on file.  Social History Social History  Substance Use Topics  . Smoking status: Current Every Day Smoker    Packs/day: 0.10    Years: 3.00    Types: Cigarettes  . Smokeless tobacco: Never Used  . Alcohol use No    Review of Systems Constitutional: No recent illness. Cardiovascular: Denies chest pain or palpitations. Respiratory: Denies shortness of breath. Musculoskeletal: Pain in Lower back with radiation to the right lower extremity. Skin: Negative for rash, wound, lesion. Neurological: Negative for focal weakness or numbness.  ____________________________________________   PHYSICAL EXAM:  VITAL SIGNS: ED Triage Vitals  Enc Vitals Group     BP 08/22/16 1055 (!) 156/91     Pulse Rate 08/22/16 1055 89     Resp 08/22/16 1055 18     Temp 08/22/16 1055 98.7 F (37.1 C)     Temp Source 08/22/16 1055 Oral     SpO2 08/22/16 1055 99 %     Weight 08/22/16 1056 (!) 356 lb (161.5 kg)     Height 08/22/16 1056 5\' 6"  (1.676 m)     Head Circumference --      Peak Flow --  Pain Score 08/22/16 1056 8     Pain Loc --      Pain Edu? --      Excl. in GC? --     Constitutional: Alert and oriented. Well appearing and in no acute distress. Eyes: Conjunctivae are normal. EOMI. Head: Atraumatic. Neck: No stridor.  Respiratory: Normal respiratory effort.   Musculoskeletal: Straight leg raise is possible bilaterally. No focal midline tenderness of the lumbar spine. Neurologic:  Normal speech and language. No gross focal neurologic deficits are appreciated. Speech is normal. No gait instability. Skin:  Skin is warm, dry and intact. Atraumatic. Psychiatric: Mood and affect are normal. Speech and behavior  are normal.  ____________________________________________   LABS (all labs ordered are listed, but only abnormal results are displayed)  Labs Reviewed - No data to display ____________________________________________  RADIOLOGY  Not indicated ____________________________________________   PROCEDURES  Procedure(s) performed: None   ____________________________________________   INITIAL IMPRESSION / ASSESSMENT AND PLAN / ED COURSE  Clinical Course    Pertinent labs & imaging results that were available during my care of the patient were reviewed by me and considered in my medical decision making (see chart for details).  Patient will be given prescriptions for prednisone taper and Flexeril. She was encouraged to call her primary care provider to check on the referral for orthopedics. She was instructed to take her medications as prescribed and return to the emergency department for symptoms change or worsen if she is unable schedule an appointment. ____________________________________________   FINAL CLINICAL IMPRESSION(S) / ED DIAGNOSES  Final diagnoses:  Sciatica of right side       Chinita Pester, FNP 08/22/16 1143    Sharyn Creamer, MD 08/22/16 1610

## 2016-08-22 NOTE — ED Triage Notes (Signed)
Pt presents to ED with reports low back pain that radiates down her right leg for over one week.

## 2016-09-19 ENCOUNTER — Emergency Department
Admission: EM | Admit: 2016-09-19 | Discharge: 2016-09-19 | Disposition: A | Discharge status: Home / Self Care | Payer: Medicaid Other | Attending: Emergency Medicine | Admitting: Emergency Medicine

## 2016-09-19 ENCOUNTER — Encounter: Payer: Self-pay | Admitting: *Deleted

## 2016-09-19 DIAGNOSIS — Z79899 Other long term (current) drug therapy: Secondary | ICD-10-CM | POA: Insufficient documentation

## 2016-09-19 DIAGNOSIS — F17210 Nicotine dependence, cigarettes, uncomplicated: Secondary | ICD-10-CM | POA: Diagnosis not present

## 2016-09-19 DIAGNOSIS — J029 Acute pharyngitis, unspecified: Secondary | ICD-10-CM | POA: Diagnosis present

## 2016-09-19 DIAGNOSIS — J039 Acute tonsillitis, unspecified: Secondary | ICD-10-CM | POA: Diagnosis not present

## 2016-09-19 LAB — POCT RAPID STREP A: STREPTOCOCCUS, GROUP A SCREEN (DIRECT): NEGATIVE

## 2016-09-19 MED ORDER — AMOXICILLIN 875 MG PO TABS: 875.0000 mg | ORAL_TABLET | Freq: Two times a day (BID) | ORAL | 0 refills | Status: DC

## 2016-09-19 NOTE — ED Triage Notes (Signed)
Pt reports sore throat, congestion body aches for 3 days

## 2016-09-19 NOTE — ED Provider Notes (Signed)
[facility] Emergency Department Provider Note   ____________________________________________   First MD Initiated Contact with Patient 09/19/16 0715     (approximate)  I have reviewed the triage vital signs and the nursing notes.   HISTORY  Chief Complaint Sore Throat   HPI Kimberly Nolan is a 30 y.o. female is here with complaint of sore throat, congestion, body aches for 3 days. Patient has not taken any over-the-counter medication for this. She is unaware of how much fever she has had home. She denies any family members having same thing. Patient has continued to eat and drink without any difficulty. She denies any nausea or vomiting. Currently she rates her pain as a 7 out of 10.   Past Medical History:  Diagnosis Date  . Thyroid disease     Patient Active Problem List   Diagnosis Date Noted  . Shortness of breath 09/13/2012  . CAP (community acquired pneumonia) 09/13/2012  . 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 Leukocytosis 09/13/2012  . Palpitations 09/13/2012    Past Surgical History:  Procedure Laterality Date  . CESAREAN SECTION      Prior to Admission medications   Medication Sig Start Date End Date Taking? Authorizing Provider  amoxicillin (AMOXIL) 875 MG tablet Take 1 tablet (875 mg total) by mouth 2 (two) times daily. 09/19/16   Tommi Rumpshonda L Gunnard Dorrance, PA-C  cyclobenzaprine (FLEXERIL) 10 MG tablet Take 1 tablet (10 mg total) by mouth 3 (three) times daily as needed for muscle spasms. 08/22/16   Cari B Triplett, FNP  IRON PO Take 1 tablet by mouth daily.     Historical Provider, MD  levofloxacin (LEVAQUIN) 500 MG tablet Take 1 tablet (500 mg total) by mouth daily. 09/15/12   Alison MurrayAlma M Devine, MD  medroxyPROGESTERone (DEPO-PROVERA) 150 MG/ML injection Inject 150 mg into the muscle every 3 (three) months.    Historical Provider, MD  predniSONE (STERAPRED UNI-PAK 21 TAB) 10 MG (21) TBPK tablet Take 6 tablets on day 1 Take 5 tablets on day 2 Take  4 tablets on day 3 Take 3 tablets on day 4 Take 2 tablets on day 5 Take 1 tablet on day 6 08/22/16   Chinita Pesterari B Triplett, FNP    Allergies Patient has no known allergies.  No family history on file.  Social History Social History  Substance Use Topics  . Smoking status: Current Every Day Smoker    Packs/day: 0.10    Years: 3.00    Types: Cigarettes  . Smokeless tobacco: Never Used  . Alcohol use No    Review of Systems Constitutional: No fever/chills ZOX:[id number]:Positive sore throat. Cardiovascular: Denies chest pain. Respiratory: Denies shortness of breath. Gastrointestinal:  No nausea, no vomiting. Musculoskeletal: Negative for back pain. Skin: Negative for rash. Neurological: Negative for headaches  10-point ROS otherwise negative.  ____________________________________________   PHYSICAL EXAM:  VITAL SIGNS: ED Triage Vitals [09/19/16 0707]  Enc Vitals Group     BP (!) 158/90     Pulse Rate 98     Resp 20     Temp 98 F (36.7 C)     Temp Source Oral     SpO2 98 %     Weight (!) 356 lb (161.5 kg)     Height 5\' 6"  (1.676 m)     Head Circumference      Peak Flow      Pain Score 7     Pain Loc      Pain Edu?  Excl. in GC?     Constitutional: Alert and oriented. Well appearing and in no acute distress. Eyes: Conjunctivae are normal. PERRL. EOMI. Head: Atraumatic. Nose: No congestion/rhinnorhea. Mouth/Throat: Mucous membranes are moist.  Oropharynx With mild erythema and bilateral tonsil exudate. Uvula is midline. Patient is able to talk without any difficulty and swallow her own saliva. Neck: No stridor.   Hematological/Lymphatic/Immunilogical: Mild bilateral cervical lymphadenopathy. Cardiovascular: Normal rate, regular rhythm. Grossly normal heart sounds.  Good peripheral circulation. Respiratory: Normal respiratory effort.  No retractions. Lungs CTAB. Musculoskeletal: Moves upper and lower extremities without any difficulty. Normal gait was  noted. Neurologic:  Normal speech and language. No gross focal neurologic deficits are appreciated. No gait instability. Skin:  Skin is warm, dry and intact. No rash noted. Psychiatric: Mood and affect are normal. Speech and behavior are normal.  ____________________________________________   LABS (all labs ordered are listed, but only abnormal results are displayed)  Labs Reviewed  CULTURE, GROUP A STREP [facility]  POCT RAPID STREP A   ____________________________________________  PROCEDURES  Procedure(s) performed: None  Procedures  Critical Care performed: No  ____________________________________________   INITIAL IMPRESSION / ASSESSMENT AND PLAN / ED COURSE  Pertinent labs & imaging results that were available during my care of the patient were reviewed by me and considered in my medical decision making (see chart for details).    Clinical Course    Rapid strep test in the emergency room was negative however a culture was sent. Patient was placed on amoxicillin 875 mg 1 twice a day for 10 days. Patient is encouraged take Tylenol or ibuprofen as needed for fever or throat pain. She is to follow-up with her primary care doctor if any continued problems.  ____________________________________________   FINAL CLINICAL IMPRESSION(S) / ED DIAGNOSES  Final diagnoses:  Exudative tonsillitis      NEW MEDICATIONS STARTED DURING THIS VISIT:  Discharge Medication List as of 09/19/2016  7:40 AM    START taking these medications   Details  amoxicillin (AMOXIL) 875 MG tablet Take 1 tablet (875 mg total) by mouth 2 (two) times daily., Starting Sun 09/19/2016, Print         Note:  This document was prepared using Dragon voice recognition software and may include unintentional dictation errors.    Tommi Rumpshonda L Bernece Gall, PA-C 09/19/16 [id number]    Minna AntisKevin Paduchowski, MD 09/19/16 1451

## 2016-09-19 NOTE — ED Notes (Signed)
Patient c/o ear, nose congestion, sore throat for 3 days

## 2016-09-19 NOTE — Discharge Instructions (Signed)
Take all of antibiotics for the next 10 days. Tylenol or ibuprofen as needed for throat pain. Increase fluids. Follow-up with your primary care doctor if any continued problems.

## 2016-09-20 LAB — CULTURE, GROUP A STREP (THRC)

## 2017-02-16 ENCOUNTER — Emergency Department: Payer: Medicaid Other

## 2017-02-16 ENCOUNTER — Encounter: Payer: Self-pay | Admitting: Emergency Medicine

## 2017-02-16 ENCOUNTER — Emergency Department
Admission: EM | Admit: 2017-02-16 | Discharge: 2017-02-17 | Payer: Medicaid Other | Attending: Student in an Organized Health Care Education/Training Program | Admitting: Student in an Organized Health Care Education/Training Program

## 2017-02-16 DIAGNOSIS — E0590 Thyrotoxicosis, unspecified without thyrotoxic crisis or storm: Secondary | ICD-10-CM | POA: Diagnosis present

## 2017-02-16 DIAGNOSIS — E0500 Thyrotoxicosis with diffuse goiter without thyrotoxic crisis or storm: Secondary | ICD-10-CM

## 2017-02-16 DIAGNOSIS — E0591 Thyrotoxicosis, unspecified with thyrotoxic crisis or storm: Secondary | ICD-10-CM

## 2017-02-16 DIAGNOSIS — F17210 Nicotine dependence, cigarettes, uncomplicated: Secondary | ICD-10-CM | POA: Diagnosis not present

## 2017-02-16 DIAGNOSIS — E0501 Thyrotoxicosis with diffuse goiter with thyrotoxic crisis or storm: Secondary | ICD-10-CM | POA: Insufficient documentation

## 2017-02-16 DIAGNOSIS — I10 Essential (primary) hypertension: Secondary | ICD-10-CM | POA: Diagnosis present

## 2017-02-16 DIAGNOSIS — R0602 Shortness of breath: Secondary | ICD-10-CM | POA: Diagnosis present

## 2017-02-16 LAB — BASIC METABOLIC PANEL
Anion gap: 5 (ref 5–15)
BUN: 10 mg/dL (ref 6–20)
CALCIUM: 8.9 mg/dL (ref 8.9–10.3)
CO2: 27 mmol/L (ref 22–32)
CREATININE: 0.45 mg/dL (ref 0.44–1.00)
Chloride: 105 mmol/L (ref 101–111)
GFR calc non Af Amer: >60 mL/min (ref >60)
Glucose, Bld: 114 mg/dL — ABNORMAL HIGH (ref 65–99)
Potassium: 3.9 mmol/L (ref 3.5–5.1)
SODIUM: 137 mmol/L (ref 135–145)

## 2017-02-16 LAB — CBC
HCT: 38.1 % (ref 35.0–47.0)
Hemoglobin: 12.6 g/dL (ref 12.0–16.0)
MCH: 26.7 pg (ref 26.0–34.0)
MCHC: 33 g/dL (ref 32.0–36.0)
MCV: 80.8 fL (ref 80.0–100.0)
PLATELETS: 224 10*3/uL (ref 150–440)
RBC: 4.72 MIL/uL (ref 3.80–5.20)
RDW: 13.6 % (ref 11.5–14.5)
WBC: 5.6 10*3/uL (ref 3.6–11.0)

## 2017-02-16 LAB — HCG, QUANTITATIVE, PREGNANCY

## 2017-02-16 LAB — URINALYSIS, COMPLETE (UACMP) WITH MICROSCOPIC
Bacteria, UA: NONE SEEN
Bilirubin Urine: NEGATIVE
GLUCOSE, UA: NEGATIVE mg/dL
HGB URINE DIPSTICK: NEGATIVE
KETONES UR: NEGATIVE mg/dL
Leukocytes, UA: NEGATIVE
NITRITE: NEGATIVE
PROTEIN: 30 mg/dL — AB
Specific Gravity, Urine: 1.02 (ref 1.005–1.030)
pH: 8 (ref 5.0–8.0)

## 2017-02-16 LAB — BRAIN NATRIURETIC PEPTIDE: B Natriuretic Peptide: 16 pg/mL (ref 0.0–100.0)

## 2017-02-16 LAB — T4, FREE: Free T4: 2.93 ng/dL — ABNORMAL HIGH (ref 0.61–1.12)

## 2017-02-16 LAB — TROPONIN I: Troponin I: <0.03 ng/mL (ref <0.03)

## 2017-02-16 LAB — TSH: TSH: <0.01 u[IU]/mL — ABNORMAL LOW (ref 0.350–4.500)

## 2017-02-16 MED ORDER — PROPRANOLOL HCL 1 MG/ML IV SOLN
1.0000 mg | Freq: Once | INTRAVENOUS | Status: DC
  Filled 2017-02-16: qty 1

## 2017-02-16 MED ORDER — SODIUM CHLORIDE 0.9 % IV SOLN
INTRAVENOUS | Status: DC
  Administered 2017-02-17: via INTRAVENOUS

## 2017-02-16 MED ORDER — PROPYLTHIOURACIL 50 MG PO TABS
200.0000 mg | ORAL_TABLET | ORAL | Status: DC
  Administered 2017-02-16 – 2017-02-17 (×2): 200 mg via ORAL
  Filled 2017-02-16 (×6): qty 4

## 2017-02-16 MED ORDER — PROPRANOLOL HCL 20 MG PO TABS
60.0000 mg | ORAL_TABLET | Freq: Once | ORAL | Status: AC
  Administered 2017-02-16: 60 mg via ORAL
  Filled 2017-02-16: qty 3

## 2017-02-16 MED ORDER — HYDROCORTISONE NA SUCCINATE PF 100 MG IJ SOLR
100.0000 mg | Freq: Once | INTRAMUSCULAR | Status: AC
  Administered 2017-02-17: 100 mg via INTRAVENOUS
  Filled 2017-02-16: qty 2

## 2017-02-16 MED ORDER — IODINE STRONG (LUGOLS) 5 % PO SOLN
0.2000 mL | Freq: Three times a day (TID) | ORAL | Status: DC
  Administered 2017-02-16: 0.2 mL via ORAL

## 2017-02-16 NOTE — ED Notes (Signed)
Pt ambulated to toilet. 

## 2017-02-16 NOTE — ED Triage Notes (Signed)
Pt to triage in wheelchair due to weakness. Pt reports she has graves disease, has been off medication for "long time," feels very tired, weak, double vision, neck pain and swelling, general malaise. Symptoms worsened over last 2 hours. Pt is poor historian in triage, significant other accompanied pt sts "she needs her thyroid taken out because she gets like this a lot."

## 2017-02-16 NOTE — ED Provider Notes (Signed)
[facility] Emergency Department Provider Note    None    (approximate)  I have reviewed the triage vital signs and the nursing notes.   HISTORY  Chief Complaint Thyroid Problem    HPI Kimberly Nolan is a 31 y.o. female this is a chief complaint of worsening shortness of breath and lethargy this started roughly 2 hours prior to arrival. Patient with a history of Graves' disease on methimazole followed by [facility] endocrinology. Has been out of his medication for several weeks that A continued increase in her unable to afford it. No recent outpatient visits. According to boyfriend at bedside the patient was having sudden onset throat tightness roughly 2 hours ago. He brought her to the ER. There is no respiratory distress. Patient lethargic and not providing much history. States that she does have a sore throat.   Past Medical History:  Diagnosis Date  . Thyroid disease    Family History  Problem Relation Age of Onset  . Thyroid disease Mother   . Hypertension Father   . Stroke Father   . Diabetes Brother   . Hypertension Brother    Past Surgical History:  Procedure Laterality Date  . CESAREAN SECTION     Patient Active Problem List   Diagnosis Date Noted  . Hyperthyroidism 02/16/2017  . Graves disease 02/16/2017  . HTN (hypertension) 02/16/2017  . Shortness of breath 09/13/2012  . CAP (community acquired pneumonia) 09/13/2012  . 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 Leukocytosis 09/13/2012  . Palpitations 09/13/2012      Prior to Admission medications   Medication Sig Start Date End Date Taking? Authorizing Provider  medroxyPROGESTERone (DEPO-PROVERA) 150 MG/ML injection Inject 150 mg into the muscle every 3 (three) months.    Historical Provider, MD    Allergies Patient has no known allergies.    Social History Social History  Substance Use Topics  . Smoking status: Current Every Day Smoker    Packs/day: 0.10    Years: 3.00   Types: Cigarettes  . Smokeless tobacco: Never Used  . Alcohol use No    Review of Systems Patient denies headaches, rhinorrhea, blurry vision, numbness, shortness of breath, chest pain, edema, cough, abdominal pain, nausea, vomiting, diarrhea, dysuria, fevers, rashes or hallucinations unless otherwise stated above in HPI. ____________________________________________   PHYSICAL EXAM:  VITAL SIGNS: Vitals:   02/16/17 2330 02/16/17 2332  BP: (!) 143/83   Pulse: 87 85  Resp:  16  Temp:      Constitutional: ill appearing, tense and swollen anterior neck goiter. tremulous Eyes: Conjunctivae are normal. PERRL. EOMI. Head: Atraumatic. Nose: No congestion/rhinnorhea. Mouth/Throat: Mucous membranes are moist.  Oropharynx non-erythematous. Neck: No stridor. tense and swollen anterior neck goiter.  Hematological/Lymphatic/Immunilogical: No cervical lymphadenopathy. Cardiovascular: tachycardic rate, regular rhythm. Grossly normal heart sounds.  Good peripheral circulation. Respiratory: Normal respiratory effort.  No retractions. Lungs with inspiratory crackles to posterior lung fields Gastrointestinal: Soft and nontender. No distention. No abdominal bruits. No CVA tenderness. Musculoskeletal: No lower extremity tenderness 2+ pedal edema.  No joint effusions. Neurologic:  Patient is drowsy and slow to answer questions but will follow commands Skin:  Skin is warm, dry and intact. No rash noted. Psychiatric: Mood and affect are normal. Speech and behavior are normal.  ____________________________________________   LABS (all labs ordered are listed, but only abnormal results are displayed)  Results for orders placed or performed during the hospital encounter of 02/16/17 (from the past 24 hour(s))  Basic metabolic panel  Status: Abnormal   Collection Time: 02/16/17  8:09 PM  Result Value Ref Range   Sodium 137 135 - 145 mmol/L   Potassium 3.9 3.5 - 5.1 mmol/L   Chloride 105 101 - 111  mmol/L   CO2 27 22 - 32 mmol/L   Glucose, Bld 114 (H) 65 - 99 mg/dL   BUN 10 6 - 20 mg/dL   Creatinine, Ser 1.61 0.44 - 1.00 mg/dL   Calcium 8.9 8.9 - 09.6 mg/dL   GFR calc non Af Amer >60 >60 mL/min   GFR calc Af Amer >60 >60 mL/min   Anion gap 5 5 - 15  CBC     Status: None   Collection Time: 02/16/17  8:09 PM  Result Value Ref Range   WBC 5.6 3.6 - 11.0 K/uL   RBC 4.72 3.80 - 5.20 MIL/uL   Hemoglobin 12.6 12.0 - 16.0 g/dL   HCT 04.5 40.9 - 81.1 %   MCV 80.8 80.0 - 100.0 fL   MCH 26.7 26.0 - 34.0 pg   MCHC 33.0 32.0 - 36.0 g/dL   RDW 91.4 78.2 - 95.6 %   Platelets 224 150 - 440 K/uL  Urinalysis, Complete w Microscopic     Status: Abnormal   Collection Time: 02/16/17  8:09 PM  Result Value Ref Range   Color, Urine YELLOW (A) YELLOW   APPearance HAZY (A) CLEAR   Specific Gravity, Urine 1.020 1.005 - 1.030   pH 8.0 5.0 - 8.0   Glucose, UA NEGATIVE NEGATIVE mg/dL   Hgb urine dipstick NEGATIVE NEGATIVE   Bilirubin Urine NEGATIVE NEGATIVE   Ketones, ur NEGATIVE NEGATIVE mg/dL   Protein, ur 30 (A) NEGATIVE mg/dL   Nitrite NEGATIVE NEGATIVE   Leukocytes, UA NEGATIVE NEGATIVE   RBC / HPF 0-5 0 - 5 RBC/hpf   WBC, UA 0-5 0 - 5 WBC/hpf   Bacteria, UA NONE SEEN NONE SEEN   Squamous Epithelial / LPF 6-30 (A) NONE SEEN   Mucous PRESENT   T4, free     Status: Abnormal   Collection Time: 02/16/17  8:09 PM  Result Value Ref Range   Free T4 2.93 (H) 0.61 - 1.12 ng/dL  TSH     Status: Abnormal   Collection Time: 02/16/17  8:09 PM  Result Value Ref Range   TSH <0.010 (L) 0.350 - 4.500 uIU/mL  Troponin I     Status: None   Collection Time: 02/16/17  8:09 PM  Result Value Ref Range   Troponin I <0.03 <0.03 ng/mL  hCG, quantitative, pregnancy     Status: None   Collection Time: 02/16/17  8:09 PM  Result Value Ref Range   hCG, Beta Chain, Quant, S <1 <5 mIU/mL  Brain natriuretic peptide     Status: None   Collection Time: 02/16/17  8:09 PM  Result Value Ref Range   B Natriuretic  Peptide 16.0 0.0 - 100.0 pg/mL   ____________________________________________  EKG My review and personal interpretation at Time: 1958   Indication: tachycardia  Rate: 110  Rhythm: sinus Axis: normal Other: normal intervals, no st elevation or depressions ____________________________________________  RADIOLOGY  I personally reviewed all radiographic images ordered to evaluate for the above acute complaints and reviewed radiology reports and findings.  These findings were personally discussed with the patient.  Please see medical record for radiology report.  ____________________________________________   PROCEDURES  Procedure(s) performed:  Procedures    Critical Care performed: yes CRITICAL CARE Performed by: Willy Eddy  Total critical care time: 30 minutes  Critical care time was exclusive of separately billable procedures and treating other patients.  Critical care was necessary to treat or prevent imminent or life-threatening deterioration.  Critical care was time spent personally by me on the following activities: development of treatment plan with patient and/or surrogate as well as nursing, discussions with consultants, evaluation of patient's response to treatment, examination of patient, obtaining history from patient or surrogate, ordering and performing treatments and interventions, ordering and review of laboratory studies, ordering and review of radiographic studies, pulse oximetry and re-evaluation of patient's condition.  ____________________________________________   INITIAL IMPRESSION / ASSESSMENT AND PLAN / ED COURSE  Pertinent labs & imaging results that were available during my care of the patient were reviewed by me and considered in my medical decision making (see chart for details).  DDX: thyroid storm, Dehydration, sepsis, pna, uti, hypoglycemia, cva, drug effect, withdrawal,    Versie K Gitto is a 31 y.o. who presents to the ED with  Symptoms as described above. Patient very fatigued. Tachycardic with tender goiter. Presentation certainly concerning for a thyroid storm as the patient has been off of her methimazole for several weeks. Patient currently protecting her airway right now but does have evidence of peripheral edema. Stat chest x-ray does show evidence of borderline cardiomegaly. Will give propranolol for the tachycardia. We'll also give a dose of PTU while awaiting labs. Her abdominal exam is soft and benign. We'll also obtain laboratory tests and evaluate for any evidence of infectious process or withdrawal.  The patient will be placed on continuous pulse oximetry and telemetry for monitoring.  Laboratory evaluation will be sent to evaluate for the above complaints.     Clinical Course as of Apr 05 0000  Wed Feb 16, 2017  2355 Spoke with [facility] hospitalist Dr. Clyde Lundborg who agrees to accept patient for further evaluation and management.  Have discussed with the patient and available family all diagnostics and treatments performed thus far and all questions were answered to the best of my ability. The patient demonstrates understanding and agreement with plan.   [PR]    Clinical Course User Index [PR] Willy Eddy, MD   ----------------------------------------- 11:25 PM on 02/16/2017 -----------------------------------------  Based on her presentation I am suspicious of thyroid storm or some variant of this process. It seems a bit atypical that this precipitated without any identifiable inciting factor. She is however clinically improving over the several hours that she's been here in the ER. Heart rate has improved. Based on her presentation do with the patient will require admission to hospital for further evaluation and management. Fortunately we do not have endocrinology as a service here therefore she will require transfer to another center.  ____________________________________________   FINAL CLINICAL  IMPRESSION(S) / ED DIAGNOSES  Final diagnoses:  Thyrotoxicosis with thyrotoxic crisis, unspecified thyrotoxicosis type  Graves disease      NEW MEDICATIONS STARTED DURING THIS VISIT:  New Prescriptions   No medications on file     Note:  This document was prepared using Dragon voice recognition software and may include unintentional dictation errors.    Willy Eddy, MD 02/17/17 0000

## 2017-02-17 ENCOUNTER — Inpatient Hospital Stay (HOSPITAL_COMMUNITY): Payer: Medicaid Other

## 2017-02-17 ENCOUNTER — Encounter (HOSPITAL_COMMUNITY): Payer: Self-pay | Admitting: Family Medicine

## 2017-02-17 ENCOUNTER — Inpatient Hospital Stay
Admission: AD | Admit: 2017-02-17 | Payer: Self-pay | Source: Other Acute Inpatient Hospital | Admitting: Internal Medicine

## 2017-02-17 ENCOUNTER — Inpatient Hospital Stay (HOSPITAL_COMMUNITY)
Admission: AD | Admit: 2017-02-17 | Discharge: 2017-02-18 | DRG: 644 | Disposition: A | Discharge status: Home / Self Care | Payer: Medicaid Other | Source: Other Acute Inpatient Hospital | Attending: Internal Medicine | Admitting: Internal Medicine

## 2017-02-17 DIAGNOSIS — E049 Nontoxic goiter, unspecified: Secondary | ICD-10-CM | POA: Diagnosis not present

## 2017-02-17 DIAGNOSIS — E0501 Thyrotoxicosis with diffuse goiter with thyrotoxic crisis or storm: Secondary | ICD-10-CM | POA: Diagnosis not present

## 2017-02-17 DIAGNOSIS — I119 Hypertensive heart disease without heart failure: Secondary | ICD-10-CM | POA: Diagnosis present

## 2017-02-17 DIAGNOSIS — I10 Essential (primary) hypertension: Secondary | ICD-10-CM | POA: Diagnosis not present

## 2017-02-17 DIAGNOSIS — Z9114 Patient's other noncompliance with medication regimen: Secondary | ICD-10-CM

## 2017-02-17 DIAGNOSIS — R7303 Prediabetes: Secondary | ICD-10-CM

## 2017-02-17 DIAGNOSIS — F17210 Nicotine dependence, cigarettes, uncomplicated: Secondary | ICD-10-CM | POA: Diagnosis present

## 2017-02-17 DIAGNOSIS — E0591 Thyrotoxicosis, unspecified with thyrotoxic crisis or storm: Secondary | ICD-10-CM | POA: Insufficient documentation

## 2017-02-17 DIAGNOSIS — E069 Thyroiditis, unspecified: Secondary | ICD-10-CM

## 2017-02-17 DIAGNOSIS — Z8349 Family history of other endocrine, nutritional and metabolic diseases: Secondary | ICD-10-CM

## 2017-02-17 DIAGNOSIS — R739 Hyperglycemia, unspecified: Secondary | ICD-10-CM

## 2017-02-17 DIAGNOSIS — Z8249 Family history of ischemic heart disease and other diseases of the circulatory system: Secondary | ICD-10-CM

## 2017-02-17 DIAGNOSIS — T380X5A Adverse effect of glucocorticoids and synthetic analogues, initial encounter: Secondary | ICD-10-CM | POA: Diagnosis not present

## 2017-02-17 DIAGNOSIS — E0500 Thyrotoxicosis with diffuse goiter without thyrotoxic crisis or storm: Principal | ICD-10-CM | POA: Diagnosis present

## 2017-02-17 DIAGNOSIS — Z6841 Body Mass Index (BMI) 40.0 and over, adult: Secondary | ICD-10-CM

## 2017-02-17 DIAGNOSIS — Z793 Long term (current) use of hormonal contraceptives: Secondary | ICD-10-CM | POA: Diagnosis not present

## 2017-02-17 HISTORY — DX: Essential (primary) hypertension: I10

## 2017-02-17 HISTORY — DX: Morbid (severe) obesity due to excess calories: E66.01

## 2017-02-17 HISTORY — DX: Nicotine dependence, unspecified, uncomplicated: F17.200

## 2017-02-17 HISTORY — DX: Thyrotoxicosis with diffuse goiter without thyrotoxic crisis or storm: E05.00

## 2017-02-17 LAB — COMPREHENSIVE METABOLIC PANEL
ALBUMIN: 2.9 g/dL — AB (ref 3.5–5.0)
ALK PHOS: 78 U/L (ref 38–126)
ALT: 12 U/L — ABNORMAL LOW (ref 14–54)
ANION GAP: 6 (ref 5–15)
AST: 11 U/L — ABNORMAL LOW (ref 15–41)
BUN: 8 mg/dL (ref 6–20)
CALCIUM: 9 mg/dL (ref 8.9–10.3)
CO2: 23 mmol/L (ref 22–32)
Chloride: 106 mmol/L (ref 101–111)
Creatinine, Ser: 0.49 mg/dL (ref 0.44–1.00)
GFR calc non Af Amer: >60 mL/min (ref >60)
Glucose, Bld: 122 mg/dL — ABNORMAL HIGH (ref 65–99)
POTASSIUM: 4.3 mmol/L (ref 3.5–5.1)
SODIUM: 135 mmol/L (ref 135–145)
TOTAL PROTEIN: 7.7 g/dL (ref 6.5–8.1)
Total Bilirubin: 0.4 mg/dL (ref 0.3–1.2)

## 2017-02-17 LAB — CBC WITH DIFFERENTIAL/PLATELET
Basophils Absolute: 0 10*3/uL (ref 0.0–0.1)
Basophils Relative: 0 %
EOS ABS: 0 10*3/uL (ref 0.0–0.7)
EOS PCT: 0 %
HCT: 36.3 % (ref 36.0–46.0)
HEMOGLOBIN: 11.7 g/dL — AB (ref 12.0–15.0)
LYMPHS ABS: 0.9 10*3/uL (ref 0.7–4.0)
Lymphocytes Relative: 19 %
MCH: 26.5 pg (ref 26.0–34.0)
MCHC: 32.2 g/dL (ref 30.0–36.0)
MCV: 82.1 fL (ref 78.0–100.0)
MONO ABS: 0.2 10*3/uL (ref 0.1–1.0)
MONOS PCT: 3 %
NEUTROS PCT: 78 %
Neutro Abs: 3.5 10*3/uL (ref 1.7–7.7)
Platelets: 208 10*3/uL (ref 150–400)
RBC: 4.42 MIL/uL (ref 3.87–5.11)
RDW: 13.2 % (ref 11.5–15.5)
WBC: 4.5 10*3/uL (ref 4.0–10.5)

## 2017-02-17 LAB — MRSA PCR SCREENING: MRSA by PCR: NEGATIVE

## 2017-02-17 LAB — GLUCOSE, CAPILLARY
GLUCOSE-CAPILLARY: 137 mg/dL — AB (ref 65–99)
Glucose-Capillary: 101 mg/dL — ABNORMAL HIGH (ref 65–99)
Glucose-Capillary: 184 mg/dL — ABNORMAL HIGH (ref 65–99)

## 2017-02-17 MED ORDER — HYDROCODONE-ACETAMINOPHEN 5-325 MG PO TABS: 1.0000 | ORAL_TABLET | ORAL | Status: DC | PRN

## 2017-02-17 MED ORDER — METOPROLOL TARTRATE 5 MG/5ML IV SOLN: 10.0000 mg | Freq: Four times a day (QID) | INTRAVENOUS | Status: DC | PRN

## 2017-02-17 MED ORDER — ENOXAPARIN SODIUM 40 MG/0.4ML ~~LOC~~ SOLN
40.0000 mg | SUBCUTANEOUS | Status: DC
  Filled 2017-02-17: qty 0.4

## 2017-02-17 MED ORDER — INSULIN ASPART 100 UNIT/ML ~~LOC~~ SOLN: 0.0000 [IU] | Freq: Every day | SUBCUTANEOUS | Status: DC

## 2017-02-17 MED ORDER — PROPYLTHIOURACIL 50 MG PO TABS
100.0000 mg | ORAL_TABLET | Freq: Three times a day (TID) | ORAL | Status: DC
  Filled 2017-02-17: qty 2

## 2017-02-17 MED ORDER — PROPRANOLOL HCL 20 MG PO TABS
60.0000 mg | ORAL_TABLET | Freq: Three times a day (TID) | ORAL | Status: DC
  Administered 2017-02-17: 60 mg via ORAL
  Filled 2017-02-17: qty 3

## 2017-02-17 MED ORDER — SODIUM CHLORIDE 0.9 % IV SOLN
INTRAVENOUS | Status: DC
  Administered 2017-02-17: 19:00:00 via INTRAVENOUS

## 2017-02-17 MED ORDER — METHIMAZOLE 10 MG PO TABS
40.0000 mg | ORAL_TABLET | Freq: Every day | ORAL | Status: DC
  Administered 2017-02-17 – 2017-02-18 (×2): 40 mg via ORAL
  Filled 2017-02-17 (×2): qty 4

## 2017-02-17 MED ORDER — HYDROCORTISONE NA SUCCINATE PF 100 MG IJ SOLR
50.0000 mg | Freq: Four times a day (QID) | INTRAMUSCULAR | Status: DC
  Administered 2017-02-17 – 2017-02-18 (×5): 50 mg via INTRAVENOUS
  Filled 2017-02-17 (×5): qty 2

## 2017-02-17 MED ORDER — SODIUM CHLORIDE 0.9 % IV SOLN
INTRAVENOUS | Status: DC
  Administered 2017-02-17: 09:00:00 via INTRAVENOUS

## 2017-02-17 MED ORDER — ONDANSETRON HCL 4 MG/2ML IJ SOLN: 4.0000 mg | Freq: Four times a day (QID) | INTRAMUSCULAR | Status: DC | PRN

## 2017-02-17 MED ORDER — PROPYLTHIOURACIL 50 MG PO TABS
100.0000 mg | ORAL_TABLET | Freq: Three times a day (TID) | ORAL | Status: DC
  Administered 2017-02-17: 100 mg via ORAL
  Filled 2017-02-17 (×2): qty 2

## 2017-02-17 MED ORDER — ONDANSETRON HCL 4 MG/2ML IJ SOLN: 4.0000 mg | Freq: Three times a day (TID) | INTRAMUSCULAR | Status: DC | PRN

## 2017-02-17 MED ORDER — SODIUM CHLORIDE 0.9 % IV BOLUS (SEPSIS)
1000.0000 mL | Freq: Once | INTRAVENOUS | Status: AC
  Administered 2017-02-17: 1000 mL via INTRAVENOUS

## 2017-02-17 MED ORDER — HYDRALAZINE HCL 20 MG/ML IJ SOLN: 5.0000 mg | INTRAMUSCULAR | Status: DC | PRN

## 2017-02-17 MED ORDER — ACETAMINOPHEN 325 MG PO TABS: 650.0000 mg | ORAL_TABLET | Freq: Four times a day (QID) | ORAL | Status: DC | PRN

## 2017-02-17 MED ORDER — ONDANSETRON HCL 4 MG PO TABS: 4.0000 mg | ORAL_TABLET | Freq: Four times a day (QID) | ORAL | Status: DC | PRN

## 2017-02-17 MED ORDER — INSULIN ASPART 100 UNIT/ML ~~LOC~~ SOLN: 0.0000 [IU] | Freq: Three times a day (TID) | SUBCUTANEOUS | Status: DC

## 2017-02-17 NOTE — Progress Notes (Signed)
Patient asked if she was able to take shower. I explained to her that she would be unable to take shower due to telemetry monitoring and protocol of stepdown unit. After explaining the risk of taking the shower, patient became non-compliant and removed ECG leads and got in shower. Patient is now out the shower and back to bed. Has agreed to wear the ECG monitor but refuses to wear slip resistance socks. Will continue to monitor patient.

## 2017-02-17 NOTE — Progress Notes (Signed)
This is a no charge note in response   Transfer from [facility] per Dr. Carolyn Stare and Dr. Manson Passey.   31 year old lady with past medical history tobacco abuse and Graves disease, who presents with generalized weakness, double vision, neck pain, lethargy, tremulus, tachycardia, elevated blood pressure 167/94, TSH less than 0.010, free T4 is 2.93. Pt ran off methimazole in the past 3 weeks, suspect thyroid storm per EDP. Pt was given PTU 200 mg q4h, propranolol 60 mg, 0.2 ml of Lugols 5% solution and solucortef 100 mg by IV. Patient's condition improved. Current heart rate is 84, blood pressure 132/79, temperature normal. Chest x-ray negative. WBC 5.6, negative troponin, BNP 16, negative pregnancy test, negative urinalysis. Pt is accepted to SDU as inpt. I asked EDP to consult endocrinologist, who agreed to do so.   Lorretta Harp, MD  [facility] Pager [phone]  If 7PM-7AM, please contact night-coverage [email or web address] Password [email or web address] 02/17/2017, 3:12 AM

## 2017-02-17 NOTE — H&P (Signed)
History and Physical    Kimberly Nolan ZOX:[id number] DOB: Aug 01, 1986 DOA: 02/17/2017  PCP: Manson Passey, MD Patient coming from: [facility]   Chief Complaint: Throat tightness and fatigue  HPI: Kimberly Nolan is a 31 y.o. female with medical history significant of Graves' disease, hypertension, morbid obesity, prediabetes, presenting to [facility] after transfer from [facility]. Patient was transferred for an endocrinology inpatient consultation due to possible thyroid storm. Patient reports a multi week history of intermittent bouts of feeling hot/flushed with occasional diaphoresis and then periods of cold. Denies any palpitations, chest pain, unintentional weight gain or weight loss, abdominal pain, dysuria, frequency, back pain, neck stiffness, headache, LOC, focal neurological deficit. She reports approximately 2 hours prior to presentation to [facility] she developed profound lethargy and throat tightness. Throat pain associated with palpation of her lower anterior neck region. Patient reports stopping her thyroid medicine several months prior to incident due to cost of her medicine. Nothing makes her symptoms better or worse.     ED Course: Patient seen at [facility] and was given steroids, PTU, propranolol and sent emergently to [facility].  Review of Systems: As per HPI otherwise 10 point review of systems negative.   Ambulatory Status: No restrictions  Past Medical History:  Diagnosis Date  . Essential hypertension   . Graves disease   . Morbid obesity (HCC)   . Smoker     Past Surgical History:  Procedure Laterality Date  . CESAREAN SECTION      Social History   Social History  . Marital status: Single    Spouse name: N/A  . Number of children: N/A  . Years of education: N/A   Occupational History  . Not on file.   Social History Main Topics  . Smoking status: Current Every Day Smoker    Packs/day: 0.10    Years: 3.00    Types: Cigarettes  . Smokeless  tobacco: Never Used  . Alcohol use No  . Drug use: No  . Sexual activity: No   Other Topics Concern  . Not on file   Social History Narrative  . No narrative on file    No Known Allergies  Family History  Problem Relation Age of Onset  . Thyroid disease Mother   . Hypertension Father   . Stroke Father   . Diabetes Brother   . Hypertension Brother     Prior to Admission medications   Medication Sig Start Date End Date Taking? Authorizing Provider  medroxyPROGESTERone (DEPO-PROVERA) 150 MG/ML injection Inject 150 mg into the muscle every 3 (three) months.    Historical Provider, MD    Physical Exam: Vitals:   02/17/17 0418 02/17/17 0700  BP: (!) 145/78 102/71  Pulse: 87 79  Resp: (!) 27 19  Temp: 98.4 F (36.9 C)   TempSrc: Oral   SpO2: 97%      General: Morbidly obese, Appears calm and comfortable Eyes:  PERRL, EOMI, normal lids, iris ENT:  grossly normal hearing, lips & tongue, mmm Neck: Enlarged goiter of the neck predominantly on the right area did nontender to palpation. Cardiovascular:  RRR, no m/r/g. 1+  LE edema.  Respiratory:  CTA bilaterally, no w/r/r. Normal respiratory effort. Abdomen:  soft, ntnd, NABS Skin:  no rash or induration seen on limited exam Musculoskeletal:  grossly normal tone BUE/BLE, good ROM, no bony abnormality Psychiatric:  grossly normal mood and affect, speech fluent and appropriate, AOx3 Neurologic:  CN 2-12 grossly intact, moves all extremities in  coordinated fashion, sensation intact  Labs on Admission: I have personally reviewed following labs and imaging studies  CBC:  Recent Labs Lab 02/16/17 2009 02/17/17 0635  WBC 5.6 4.5  NEUTROABS  --  3.5  HGB 12.6 11.7*  HCT 38.1 36.3  MCV 80.8 82.1  PLT 224 208   Basic Metabolic Panel:  Recent Labs Lab 02/16/17 2009 02/17/17 0635  NA 137 135  K 3.9 4.3  CL 105 106  CO2 27 23  GLUCOSE 114* 122*  BUN 10 8  CREATININE 0.45 0.49  CALCIUM 8.9 9.0   GFR: Estimated  Creatinine Clearance: 155.6 mL/min (by C-G formula based on SCr of 0.49 mg/dL). Liver Function Tests:  Recent Labs Lab 02/17/17 0635  AST 11*  ALT 12*  ALKPHOS 78  BILITOT 0.4  PROT 7.7  ALBUMIN 2.9*   No results for input(s): LIPASE, AMYLASE in the last 168 hours. No results for input(s): AMMONIA in the last 168 hours. Coagulation Profile: No results for input(s): INR, PROTIME in the last 168 hours. Cardiac Enzymes:  Recent Labs Lab 02/16/17 2009  TROPONINI <0.03   BNP (last 3 results) No results for input(s): PROBNP in the last 8760 hours. HbA1C: No results for input(s): HGBA1C in the last 72 hours. CBG: No results for input(s): GLUCAP in the last 168 hours. Lipid Profile: No results for input(s): CHOL, HDL, LDLCALC, TRIG, CHOLHDL, LDLDIRECT in the last 72 hours. Thyroid Function Tests:  Recent Labs  02/16/17 2009  TSH <0.010*  FREET4 2.93*   Anemia Panel: No results for input(s): VITAMINB12, FOLATE, FERRITIN, TIBC, IRON, RETICCTPCT in the last 72 hours. Urine analysis:    Component Value Date/Time   COLORURINE YELLOW (A) 02/16/2017 2009   APPEARANCEUR HAZY (A) 02/16/2017 2009   APPEARANCEUR Clear 07/16/2013 1339   LABSPEC 1.020 02/16/2017 2009   LABSPEC 1.014 07/16/2013 1339   PHURINE 8.0 02/16/2017 2009   GLUCOSEU NEGATIVE 02/16/2017 2009   GLUCOSEU Negative 07/16/2013 1339   HGBUR NEGATIVE 02/16/2017 2009   BILIRUBINUR NEGATIVE 02/16/2017 2009   BILIRUBINUR Negative 07/16/2013 1339   KETONESUR NEGATIVE 02/16/2017 2009   PROTEINUR 30 (A) 02/16/2017 2009   NITRITE NEGATIVE 02/16/2017 2009   LEUKOCYTESUR NEGATIVE 02/16/2017 2009   LEUKOCYTESUR Negative 07/16/2013 1339    Creatinine Clearance: Estimated Creatinine Clearance: 155.6 mL/min (by C-G formula based on SCr of 0.49 mg/dL).  Sepsis Labs: (procalcitonin:4,lacticidven:4) ) Recent Results (from the past 240 hour(s))  MRSA PCR Screening     Status: None   Collection Time: 02/17/17   4:53 AM  Result Value Ref Range Status   MRSA by PCR NEGATIVE NEGATIVE Final    Comment:        The GeneXpert MRSA Assay (FDA approved for NASAL specimens only), is one component of a comprehensive MRSA colonization surveillance program. It is not intended to diagnose MRSA infection nor to guide or monitor treatment for MRSA infections.      Radiological Exams on Admission: Dg Chest 1 View  Result Date: 02/16/2017 CLINICAL DATA:  Weakness swelling is and neck pain EXAM: CHEST 1 VIEW COMPARISON:  02/06/2014 FINDINGS: Borderline to mild cardiomegaly. No acute infiltrate or effusion. No pneumothorax. IMPRESSION: Borderline enlargement of the heart size. No acute infiltrate or edema. Electronically Signed   By: Jasmine Pang M.D.   On: 02/16/2017 20:43    EKG: Independently reviewed. Sinus tach w/o ACS  Assessment/Plan Active Problems:   Graves disease   Essential hypertension   Thyroiditis   Goiter   Hyperglycemia  Prediabetes   Morbid obesity (HCC)   Thyroid flare w/ Thyroiditis: Likely related to untreated Grave's disease. Doubt true Thyroid storm due to lack of objective criteria and relatively mild symptoms. Goiter pain much improved after steroids and PTU. Doubt infectious etiology but cannot exclude entirely. Current labs show TSH of less than 0.01 with a free T4 of 2.93. Review of patient's records from [facility] show previous TSH of less than 0.01 and free T4 of 2.18 as of 10/2016 again making thyroid storm less likely. - Rapid strep (pt had complained of throat pain), BCX, Thyroid US  - Resume Tapazole  daily - Continue solucortef until 02/18/17 - Stop Propranolol, and PTU - Lopressor PRN HR >110 - No Endocrine services at [facility] so if pt really wants endocrine eval will need transfer to [facility] - CSW/Case Mgt consult for inability to afford medications - Trop, Tele  HTN: not on chronic medications. Multiple elevated readings in recent past - Hydralazine prn - Defer  chronic mgt to pcp team at [facility]  Hyperglycemia/Pre-DM: Last A1c 5.21 October 2016. Patient morbidly obese. Currently on steroids for acute symptoms. CBG 122 on admission. Anticipate this to continue to rise during admission. - CBG every before meals at bedtime - Sensitive SSI - A1c   DVT prophylaxis: lovenox  Code Status: full  Family Communication: boufriend  Disposition Plan: pending workup, IV medications and observation to ensure improvement for safe discharge. Consults called: none  Admission status: inpatient    Fantasia Jinkins J MD [facility]  If 7PM-7AM, please contact night-coverage [email or web address] Password [email or web address]  02/17/2017, 9:56 AM

## 2017-02-17 NOTE — ED Notes (Signed)
[facility] arrived to take pt.

## 2017-02-17 NOTE — Care Management Note (Addendum)
Case Management Note  Patient Details  Name: Kimberly Nolan MRN: [id number] Date of Birth: [birth year]  Subjective/Objective:   From home presents with thyroid storm, had not been taking medications, bp low on admit.  She is ambulatory.  NCM went in to speak with patient, she said to come back tomorrow, look like she was crying.  NCM stated will be back tomorrow.    4/6 11:00 Letha Cape RN, BSN- NCM spoke with patient today, she states she did not have any money to get her medications. NCM called the pharmacy where her script was sent to ,[facility], spoke with pharmacist and she states the patient's co pay today will be $0 because they did not have the whole amt to fill , they will give her enough to last thru the weekend and then on Monday will need to pay co pay of $3.  NCM informed patient of this information, she states she will have the $3 by Monday.  Also NCM gave her , her medicaid number , she states she did not have it.  Patient will follow up with [facility] clinic after dc.  This is who is listed on her Medicaid Card.  Patient for dc today, no other needs.               Action/Plan:   Expected Discharge Date:                  Expected Discharge Plan:  Home/Self Care  In-House Referral:     Discharge planning Services  CM Consult  Post Acute Care Choice:    Choice offered to:     DME Arranged:    DME Agency:     HH Arranged:    HH Agency:     Status of Service:  In process, will continue to follow  If discussed at Long Length of Stay Meetings, dates discussed:    Additional Comments:  Leone Haven, RN 02/17/2017, 5:24 PM

## 2017-02-18 LAB — HEMOGLOBIN A1C
Hgb A1c MFr Bld: 5.1 % (ref 4.8–5.6)
MEAN PLASMA GLUCOSE: 100 mg/dL

## 2017-02-18 LAB — GLUCOSE, CAPILLARY: Glucose-Capillary: 113 mg/dL — ABNORMAL HIGH (ref 65–99)

## 2017-02-18 LAB — COMPREHENSIVE METABOLIC PANEL
ALBUMIN: 2.7 g/dL — AB (ref 3.5–5.0)
ALK PHOS: 78 U/L (ref 38–126)
ALT: 12 U/L — ABNORMAL LOW (ref 14–54)
ANION GAP: 8 (ref 5–15)
AST: 11 U/L — ABNORMAL LOW (ref 15–41)
BUN: 8 mg/dL (ref 6–20)
CALCIUM: 9 mg/dL (ref 8.9–10.3)
CO2: 24 mmol/L (ref 22–32)
Chloride: 106 mmol/L (ref 101–111)
Creatinine, Ser: 0.48 mg/dL (ref 0.44–1.00)
GFR calc non Af Amer: >60 mL/min (ref >60)
GLUCOSE: 141 mg/dL — AB (ref 65–99)
POTASSIUM: 4 mmol/L (ref 3.5–5.1)
SODIUM: 138 mmol/L (ref 135–145)
Total Bilirubin: 0.2 mg/dL — ABNORMAL LOW (ref 0.3–1.2)
Total Protein: 7.3 g/dL (ref 6.5–8.1)

## 2017-02-18 LAB — CBC
HEMATOCRIT: 35.6 % — AB (ref 36.0–46.0)
HEMOGLOBIN: 11.3 g/dL — AB (ref 12.0–15.0)
MCH: 26.1 pg (ref 26.0–34.0)
MCHC: 31.7 g/dL (ref 30.0–36.0)
MCV: 82.2 fL (ref 78.0–100.0)
Platelets: 191 10*3/uL (ref 150–400)
RBC: 4.33 MIL/uL (ref 3.87–5.11)
RDW: 13.2 % (ref 11.5–15.5)
WBC: 4.5 10*3/uL (ref 4.0–10.5)

## 2017-02-18 LAB — T3, FREE
T3 FREE: 8.8 pg/mL — AB (ref 2.0–4.4)
T3, Free: 4.8 pg/mL — ABNORMAL HIGH (ref 2.0–4.4)

## 2017-02-18 LAB — HIV ANTIBODY (ROUTINE TESTING W REFLEX): HIV Screen 4th Generation wRfx: NONREACTIVE

## 2017-02-18 MED ORDER — METHIMAZOLE 10 MG PO TABS: 20.0000 mg | ORAL_TABLET | Freq: Two times a day (BID) | ORAL | 0 refills | Status: DC

## 2017-02-18 NOTE — Progress Notes (Signed)
   02/18/2017   To Whom It May Concern,  Kimberly Nolan was admitted to [name] from 02/17/2017 to 02/18/2017.  She may return to work without restrictions on 4/9.    Sincerely,    Renae Fickle, MD [facility] [address] [place]  [address]  Ph:    [phone] Fax:  [phone]

## 2017-02-18 NOTE — Discharge Summary (Signed)
Physician Discharge Summary  Kimberly Nolan CXK:[id number] DOB: 06-21-1986 DOA: 02/17/2017  PCP: Leisa Lenz, MD  Admit date: 02/17/2017 Discharge date: 02/18/2017  Admitted From: home  Disposition:  home  Recommendations for Outpatient Follow-up:  1. Follow up with PCP in 1 week 2. Given new prescription for methimazole which should cost $3 at [place]:  none  Equipment/Devices:  None  Discharge Condition:  Stable, improved CODE STATUS:  Full code  Diet recommendation:  Healthy heart   Brief/Interim Summary:  Kimberly Nolan is a 31 y.o. female with history of Graves' disease, hypertension, morbid obesity, prediabetes,  Who presented to [facility] after transfer from [facility].  Patient reported a multi week history of intermittent bouts of feeling hot/flushed with occasional diaphoresis and then periods of cold as well as a feeling of fullness in her throat with difficulty breathing.  Patient reported stopping her thyroid medicine several months prior to incident due to cost of her medicine.  She has been unable to work because she feels too unwell and therefore cannot afford her prescription.   Discharge Diagnoses:  Active Problems:   Graves disease   Essential hypertension   Thyroiditis   Goiter   Hyperglycemia   Prediabetes   Morbid obesity ([place])  Thyroid flare w/ Thyroiditis: Likely related to untreated Grave's disease. Patient is not in thyroid storm due to lack of objective criteria and relatively mild symptoms. Goiter pain much improved after steroids and PTU.  Doubt infectious etiology but cannot exclude entirely. Current labs show TSH of less than 0.01 with a free T4 of 2.93. Review of patient's records from [place] show previous TSH of less than 0.01 and free T4 of 2.18 as of 10/2016.  After steroids and resumption of methimazole, she felt very well on 4/6 and her vital signs remained stable.    - Given prescription for methimazole and she and her  husband met with case management to discuss ways of affording her medication -  She asked about disability and I deferred to her primary care doctor -  Given lack of tachycardia and problems with medication compliance, I did not prescribe any beta blocker or steroids at this time  HTN: not on chronic medications. Multiple elevated readings in recent past - Defer chronic mgt to pcp team at [facility] -  BP may trend down with treatment of thyroid disease  Hyperglycemia, likely due to stress and steroids.  A1c 5.1.   Discharge Instructions  Discharge Instructions    Call MD for:  difficulty breathing, headache or visual disturbances    Complete by:  As directed    Call MD for:  extreme fatigue    Complete by:  As directed    Call MD for:  hives    Complete by:  As directed    Call MD for:  persistant dizziness or light-headedness    Complete by:  As directed    Call MD for:  persistant nausea and vomiting    Complete by:  As directed    Call MD for:  severe uncontrolled pain    Complete by:  As directed    Call MD for:  temperature >100.4    Complete by:  As directed    Diet - low sodium heart healthy    Complete by:  As directed    Increase activity slowly    Complete by:  As directed        Medication List    TAKE  these medications   medroxyPROGESTERone 150 MG/ML injection Commonly known as:  DEPO-PROVERA Inject 150 mg into the muscle every 3 (three) months.   methimazole 10 MG tablet Commonly known as:  TAPAZOLE Take 2 tablets (20 mg total) by mouth 2 (two) times daily.      Follow-up Information    Leisa Lenz, MD Follow up.   Specialty:  Internal Medicine Contact information: [address] [place] [address] [phone]        Judi Cong, MD. Schedule an appointment as soon as possible for a visit in 1 week(s).   Specialty:  Endocrinology Contact information: [place] [address] [phone]           No Known Allergies  Consultations: Case management   Procedures/Studies: Dg Chest 1 View  Result Date: 02/16/2017 CLINICAL DATA:  Weakness swelling is and neck pain EXAM: CHEST 1 VIEW COMPARISON:  02/06/2014 FINDINGS: Borderline to mild cardiomegaly. No acute infiltrate or effusion. No pneumothorax. IMPRESSION: Borderline enlargement of the heart size. No acute infiltrate or edema. Electronically Signed   By: Donavan Foil M.D.   On: 02/16/2017 20:43   US Thyroid  Result Date: 02/17/2017 CLINICAL DATA:  31 year old female with a history of Graves disease EXAM: THYROID ULTRASOUND TECHNIQUE: Ultrasound examination of the thyroid gland and adjacent soft tissues was performed. COMPARISON:  None. FINDINGS: Parenchymal Echotexture: Moderately heterogenous Isthmus: 2.1 cm Right lobe: 80.3 cm x 4.0 cm x 2.1 cm Left lobe: 8.6 cm x 2.8 cm x 2.7 cm _________________________________________________________ Estimated total number of nodules >/= 1 cm: 0 Number of spongiform nodules >/=  2 cm not described below (TR1): 0 Number of mixed cystic and solid nodules >/= 1.5 cm not described below (TR2): 0 _________________________________________________________ No discrete nodules are seen within the thyroid gland. IMPRESSION: Enlarged heterogeneous thyroid compatible with medical thyroid disease. Electronically Signed   By: Corrie Mckusick D.O.   On: 02/17/2017 13:48   Subjective: Feeling much better today.  Denies problems breathing.  Neck is less sore.  Denies nausea, chest pain.    Discharge Exam: Vitals:   02/18/17 0420 02/18/17 0700  BP: (!) 149/76 135/78  Pulse: 68 64  Resp: (!) 26 (!) 23  Temp: 98 F (36.7 C) 98.4 F (36.9 C)   Vitals:   02/17/17 2007 02/17/17 2352 02/18/17 0420 02/18/17 0700  BP: (!) 148/81 136/73 (!) 149/76 135/78  Pulse: 91 88 68 64  Resp: 19 18 (!) 26 (!) 23  Temp: 98.3 F (36.8 C) 98.3 F (36.8 C) 98 F (36.7 C) 98.4 F (36.9 C)  TempSrc: Oral Oral Oral Oral  SpO2:  99% 99% 98% 98%  Weight:      Height:        General: morbidly obese female, no acute distress HEENT:  NCAT, MMM, nonerythematous tonsils/soft palate, large minimally tender goiter Cardiovascular: RRR, S1/S2 +, no rubs, no gallops Respiratory: CTA bilaterally, no wheezing, no rhonchi Abdominal: Soft, NT, ND, bowel sounds + Extremities: 2+ firm/woody edema bilateral lower extremities, no cyanosis    The results of significant diagnostics from this hospitalization (including imaging, microbiology, ancillary and laboratory) are listed below for reference.     Microbiology: Recent Results (from the past 240 hour(s))  MRSA PCR Screening     Status: None   Collection Time: 02/17/17  4:53 AM  Result Value Ref Range Status   MRSA by PCR NEGATIVE NEGATIVE Final    Comment:  The GeneXpert MRSA Assay (FDA approved for NASAL specimens only), is one component of a comprehensive MRSA colonization surveillance program. It is not intended to diagnose MRSA infection nor to guide or monitor treatment for MRSA infections.   Culture, blood (routine x 2)     Status: None (Preliminary result)   Collection Time: 02/17/17 10:41 AM  Result Value Ref Range Status   Specimen Description BLOOD LEFT ARM  Final   Special Requests IN PEDIATRIC BOTTLE Blood Culture adequate volume  Final   Culture NO GROWTH 1 DAY  Final   Report Status PENDING  Incomplete  Culture, blood (routine x 2)     Status: None (Preliminary result)   Collection Time: 02/17/17 10:50 AM  Result Value Ref Range Status   Specimen Description BLOOD LEFT HAND  Final   Special Requests IN PEDIATRIC BOTTLE Blood Culture adequate volume  Final   Culture NO GROWTH 1 DAY  Final   Report Status PENDING  Incomplete     Labs: BNP (last 3 results)  Recent Labs  02/16/17 2009  BNP 95.0   Basic Metabolic Panel:  Recent Labs Lab 02/16/17 2009 02/17/17 0635 02/18/17 0254  NA 137 135 138  K 3.9 4.3 4.0  CL 105 106 106   CO2 _0 GLUCOSE 114* 122* 141*  BUN _1 CREATININE 0.45 0.49 0.48  CALCIUM 8.9 9.0 9.0   Liver Function Tests:  Recent Labs Lab 02/17/17 0635 02/18/17 0254  AST 11* 11*  ALT 12* 12*  ALKPHOS 78 78  BILITOT 0.4 0.2*  PROT 7.7 7.3  ALBUMIN 2.9* 2.7*   No results for input(s): LIPASE, AMYLASE in the last 168 hours. No results for input(s): AMMONIA in the last 168 hours. CBC:  Recent Labs Lab 02/16/17 2009 02/17/17 0635 02/18/17 0254  WBC 5.6 4.5 4.5  NEUTROABS  --  3.5  --   HGB 12.6 11.7* 11.3*  HCT 38.1 36.3 35.6*  MCV 80.8 82.1 82.2  PLT 224 208 191   Cardiac Enzymes:  Recent Labs Lab 02/16/17 2009  TROPONINI <0.03   BNP: Invalid input(s): POCBNP CBG:  Recent Labs Lab 02/17/17 1330 02/17/17 1618 02/17/17 2146 02/18/17 0817  GLUCAP 101* 137* 184* 113*   D-Dimer No results for input(s): DDIMER in the last 72 hours. Hgb A1c  Recent Labs  02/17/17 1041  HGBA1C 5.1   Lipid Profile No results for input(s): CHOL, HDL, LDLCALC, TRIG, CHOLHDL, LDLDIRECT in the last 72 hours. Thyroid function studies  Recent Labs  02/16/17 2009 02/17/17 0635  TSH <0.010*  --   T3FREE 8.8* 4.8*   Anemia work up No results for input(s): VITAMINB12, FOLATE, FERRITIN, TIBC, IRON, RETICCTPCT in the last 72 hours. Urinalysis    Component Value Date/Time   COLORURINE YELLOW (A) 02/16/2017 2009   APPEARANCEUR HAZY (A) 02/16/2017 2009   APPEARANCEUR Clear 07/16/2013 1339   LABSPEC 1.020 02/16/2017 2009   LABSPEC 1.014 07/16/2013 1339   PHURINE 8.0 02/16/2017 2009   GLUCOSEU NEGATIVE 02/16/2017 2009   GLUCOSEU Negative 07/16/2013 1339   HGBUR NEGATIVE 02/16/2017 2009   BILIRUBINUR NEGATIVE 02/16/2017 2009   BILIRUBINUR Negative 07/16/2013 [place] NEGATIVE 02/16/2017 2009   PROTEINUR 30 (A) 02/16/2017 2009   NITRITE NEGATIVE 02/16/2017 2009   LEUKOCYTESUR NEGATIVE 02/16/2017 2009   LEUKOCYTESUR Negative 07/16/2013 1339   Sepsis  Labs Invalid input(s): PROCALCITONIN,  WBC,  LACTICIDVEN   Time coordinating discharge: Over 30 minutes  SIGNED:   Candice Tobey,  Noah Delaine, MD  [facility] 02/18/2017, 3:42 PM Pager   If 7PM-7AM, please contact night-coverage [email or web address] Password [email or web address]

## 2017-02-18 NOTE — Clinical Social Work Note (Signed)
CSW acknowledges consult "Medication affordability on medicaid." Will notify RNCM.  CSW signing off. Consult again if any social work needs arise.  Charlynn Court, CSW [phone]

## 2017-02-18 NOTE — Progress Notes (Signed)
   02/18/2017   To Whom It May Concern,  Kierstyn Ram was admitted to [facility] from 02/17/2017 to 02/18/2017.  She was accompanied by her husband Evangeline Dakin.    Sincerely,    Renae Fickle, MD [facility] [address] [place]  [address]  Ph:    [phone] Fax:  [phone]

## 2017-02-18 NOTE — Discharge Instructions (Signed)
Hyperthyroidism Hyperthyroidism is when the thyroid is too active (overactive). Your thyroid is a large gland that is located in your neck. The thyroid helps to control how your body uses food (metabolism). When your thyroid is overactive, it produces too much of a hormone called thyroxine. What are the causes? Causes of hyperthyroidism may include:  Graves disease. This is when your immune system attacks the thyroid gland. This is the most common cause.  Inflammation of the thyroid gland.  Tumor in the thyroid gland or somewhere else.  Excessive use of thyroid medicines, including: ? Prescription thyroid supplement. ? Herbal supplements that mimic thyroid hormones.  Solid or fluid-filled lumps within your thyroid gland (thyroid nodules).  Excessive ingestion of iodine.  What increases the risk?  Being female.  Having a family history of thyroid conditions. What are the signs or symptoms? Signs and symptoms of hyperthyroidism may include:  Nervousness.  Inability to tolerate heat.  Unexplained weight loss.  Diarrhea.  Change in the texture of hair or skin.  Heart skipping beats or making extra beats.  Rapid heart rate.  Loss of menstruation.  Shaky hands.  Fatigue.  Restlessness.  Increased appetite.  Sleep problems.  Enlarged thyroid gland or nodules.  How is this diagnosed? Diagnosis of hyperthyroidism may include:  Medical history and physical exam.  Blood tests.  Ultrasound tests.  How is this treated? Treatment may include:  Medicines to control your thyroid.  Surgery to remove your thyroid.  Radiation therapy.  Follow these instructions at home:  Take medicines only as directed by your health care provider.  Do not use any tobacco products, including cigarettes, chewing tobacco, or electronic cigarettes. If you need help quitting, ask your health care provider.  Do not exercise or do physical activity until your health care provider  approves.  Keep all follow-up appointments as directed by your health care provider. This is important. Contact a health care provider if:  Your symptoms do not get better with treatment.  You have fever.  You are taking thyroid replacement medicine and you: ? Have depression. ? Feel mentally and physically slow. ? Have weight gain. Get help right away if:  You have decreased alertness or a change in your awareness.  You have abdominal pain.  You feel dizzy.  You have a rapid heartbeat.  You have an irregular heartbeat. This information is not intended to replace advice given to you by your health care provider. Make sure you discuss any questions you have with your health care provider. Document Released: 11/01/2005 Document Revised: 04/01/2016 Document Reviewed: 03/19/2014 Elsevier Interactive Patient Education  2017 [employer]  

## 2017-02-18 NOTE — Progress Notes (Addendum)
DC instructions given to patient and spouse at bedside. Questions answered re: dc meds and f/u appts. CM assisted with financial issue of "no gas in my car in the garage, no we don't need a bus pass. We need money for gas and medication, I have been out of work for three days because I have been here with my wife," per spouse. Spouse speaking with CM Gavin Pound, "You know, I'm black, you know I don't have no shame." Then proceeded to ask staff in hall for cash. Educated spouse that he cannot ask staff and visitors for money. CM was able to give financial assistance to help aid the patient with medication/expenses.  VSS. CCMD notified of discharge. All belongings sent home with patient. PIV DC, hemostasis achieved.    Pt ambulatory to private vehicle driven by spouse.  Very pleasant upon DC.

## 2017-02-22 LAB — CULTURE, BLOOD (ROUTINE X 2)
CULTURE: NO GROWTH
CULTURE: NO GROWTH
SPECIAL REQUESTS: ADEQUATE
Special Requests: ADEQUATE

## 2017-03-01 ENCOUNTER — Encounter: Payer: Self-pay | Admitting: *Deleted

## 2017-03-01 ENCOUNTER — Emergency Department
Admission: EM | Admit: 2017-03-01 | Discharge: 2017-03-01 | Disposition: A | Discharge status: Home / Self Care | Payer: Medicaid Other | Attending: Emergency Medicine | Admitting: Emergency Medicine

## 2017-03-01 ENCOUNTER — Emergency Department: Payer: Medicaid Other

## 2017-03-01 DIAGNOSIS — F17210 Nicotine dependence, cigarettes, uncomplicated: Secondary | ICD-10-CM | POA: Diagnosis not present

## 2017-03-01 DIAGNOSIS — I10 Essential (primary) hypertension: Secondary | ICD-10-CM | POA: Diagnosis not present

## 2017-03-01 DIAGNOSIS — Z5321 Procedure and treatment not carried out due to patient leaving prior to being seen by health care provider: Secondary | ICD-10-CM | POA: Diagnosis not present

## 2017-03-01 DIAGNOSIS — R079 Chest pain, unspecified: Secondary | ICD-10-CM | POA: Diagnosis present

## 2017-03-01 LAB — CBC
HEMATOCRIT: 37.9 % (ref 35.0–47.0)
HEMOGLOBIN: 12.5 g/dL (ref 12.0–16.0)
MCH: 26.2 pg (ref 26.0–34.0)
MCHC: 32.9 g/dL (ref 32.0–36.0)
MCV: 79.7 fL — ABNORMAL LOW (ref 80.0–100.0)
Platelets: 233 10*3/uL (ref 150–440)
RBC: 4.76 MIL/uL (ref 3.80–5.20)
RDW: 13.7 % (ref 11.5–14.5)
WBC: 5.4 10*3/uL (ref 3.6–11.0)

## 2017-03-01 LAB — BASIC METABOLIC PANEL
ANION GAP: 4 — AB (ref 5–15)
BUN: 9 mg/dL (ref 6–20)
CO2: 28 mmol/L (ref 22–32)
Calcium: 9 mg/dL (ref 8.9–10.3)
Chloride: 105 mmol/L (ref 101–111)
Creatinine, Ser: 0.57 mg/dL (ref 0.44–1.00)
GFR calc Af Amer: >60 mL/min (ref >60)
GLUCOSE: 111 mg/dL — AB (ref 65–99)
POTASSIUM: 3.8 mmol/L (ref 3.5–5.1)
Sodium: 137 mmol/L (ref 135–145)

## 2017-03-01 LAB — TROPONIN I: Troponin I: <0.03 ng/mL (ref <0.03)

## 2017-03-01 NOTE — ED Triage Notes (Signed)
Pt states left sided chest pain that goes to her upper left arm, started around 1pm. Sometimes when she takes a deep breath the pain is worse.

## 2017-03-02 ENCOUNTER — Telehealth: Payer: Self-pay | Admitting: Emergency Medicine

## 2017-03-02 NOTE — Telephone Encounter (Signed)
Called patient due to lwot to inquire about condition and follow up plans. Advised patient to go in for physician exam.  She agrees.

## 2017-03-26 ENCOUNTER — Encounter: Payer: Self-pay | Admitting: Emergency Medicine

## 2017-03-26 DIAGNOSIS — R202 Paresthesia of skin: Secondary | ICD-10-CM | POA: Diagnosis not present

## 2017-03-26 DIAGNOSIS — R609 Edema, unspecified: Secondary | ICD-10-CM | POA: Insufficient documentation

## 2017-03-26 DIAGNOSIS — I10 Essential (primary) hypertension: Secondary | ICD-10-CM | POA: Insufficient documentation

## 2017-03-26 DIAGNOSIS — R0602 Shortness of breath: Secondary | ICD-10-CM | POA: Insufficient documentation

## 2017-03-26 DIAGNOSIS — F17210 Nicotine dependence, cigarettes, uncomplicated: Secondary | ICD-10-CM | POA: Diagnosis not present

## 2017-03-26 DIAGNOSIS — Z79899 Other long term (current) drug therapy: Secondary | ICD-10-CM | POA: Insufficient documentation

## 2017-03-26 DIAGNOSIS — M7989 Other specified soft tissue disorders: Secondary | ICD-10-CM | POA: Diagnosis present

## 2017-03-26 NOTE — ED Triage Notes (Addendum)
Pt to triage in wheelchair due to pain and swelling bilateral lower leg and feet x2 days. Pt denies any other symptoms at this time. Pt has HX graves disease. On assessment pt has pulses in both feet, swelling noted, pt able to move feet and legs without difficulty.

## 2017-03-27 ENCOUNTER — Emergency Department
Admission: EM | Admit: 2017-03-27 | Discharge: 2017-03-27 | Disposition: A | Discharge status: Home / Self Care | Payer: Medicaid Other | Attending: Emergency Medicine | Admitting: Emergency Medicine

## 2017-03-27 DIAGNOSIS — R609 Edema, unspecified: Secondary | ICD-10-CM

## 2017-03-27 DIAGNOSIS — R202 Paresthesia of skin: Secondary | ICD-10-CM

## 2017-03-27 LAB — CBC
HEMATOCRIT: 39.1 % (ref 35.0–47.0)
Hemoglobin: 13 g/dL (ref 12.0–16.0)
MCH: 27.3 pg (ref 26.0–34.0)
MCHC: 33.2 g/dL (ref 32.0–36.0)
MCV: 82 fL (ref 80.0–100.0)
Platelets: 238 10*3/uL (ref 150–440)
RBC: 4.76 MIL/uL (ref 3.80–5.20)
RDW: 14 % (ref 11.5–14.5)
WBC: 6.1 10*3/uL (ref 3.6–11.0)

## 2017-03-27 LAB — TROPONIN I: Troponin I: <0.03 ng/mL (ref <0.03)

## 2017-03-27 LAB — BASIC METABOLIC PANEL
Anion gap: 6 (ref 5–15)
BUN: 10 mg/dL (ref 6–20)
CHLORIDE: 105 mmol/L (ref 101–111)
CO2: 27 mmol/L (ref 22–32)
CREATININE: 0.67 mg/dL (ref 0.44–1.00)
Calcium: 8.7 mg/dL — ABNORMAL LOW (ref 8.9–10.3)
GFR calc Af Amer: >60 mL/min (ref >60)
GFR calc non Af Amer: >60 mL/min (ref >60)
GLUCOSE: 95 mg/dL (ref 65–99)
Potassium: 3.9 mmol/L (ref 3.5–5.1)
Sodium: 138 mmol/L (ref 135–145)

## 2017-03-27 LAB — BRAIN NATRIURETIC PEPTIDE: B Natriuretic Peptide: 26 pg/mL (ref 0.0–100.0)

## 2017-03-27 MED ORDER — FUROSEMIDE 20 MG PO TABS: 20.0000 mg | ORAL_TABLET | Freq: Every day | ORAL | 0 refills | Status: DC

## 2017-03-27 NOTE — Discharge Instructions (Signed)
Please follow up for BP meds

## 2017-03-27 NOTE — ED Notes (Signed)
Pt updated multiple times on wait. Pt verbalizes understanding. Warm blankets provided.

## 2017-03-27 NOTE — ED Provider Notes (Signed)
[facility] Emergency Department Provider Note   ____________________________________________   First MD Initiated Contact with Patient 03/27/17 [phone]     (approximate)  I have reviewed the triage vital signs and the nursing notes.   HISTORY  Chief Complaint Leg Swelling    HPI Kimberly Nolan is a 31 y.o. female who comes into the hospital today with the swelling. The patient states this started about 2 days ago and she denies being on her feet for long periods of time. She reports that she thinks she could have some swelling in her legs as well but it is definitely her feet and ankles.The patient reports that she tried to rest them but she is feeling some tingling in both of her feet as well. The patient reports that she has had swelling before and ended up coming into the hospital because of her thyroid. She reports that she has been taking her medicine for her thyroid. The patient does not have a primary care physician but gets her meds from [facility]. She hasn't missed any doses of medicine. The patient denies any chest pain, shortness of breath, dizziness, lightheadedness. She is here today for evaluation.   Past Medical History:  Diagnosis Date  . Essential hypertension   . Graves disease   . Morbid obesity (HCC)   . Smoker     Patient Active Problem List   Diagnosis Date Noted  . 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 Thyroiditis 02/17/2017  . Goiter 02/17/2017  . Hyperglycemia 02/17/2017  . Prediabetes 02/17/2017  . Morbid obesity (HCC) 02/17/2017  . Hyperthyroidism 02/16/2017  . Graves disease 02/16/2017  . Essential hypertension 02/16/2017  . Shortness of breath 09/13/2012  . CAP (community acquired pneumonia) 09/13/2012  . 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 Leukocytosis 09/13/2012  . Palpitations 09/13/2012    Past Surgical History:  Procedure Laterality Date  . CESAREAN SECTION      Prior to Admission medications   Medication Sig Start Date  End Date Taking? Authorizing Provider  furosemide (LASIX) 20 MG tablet Take 1 tablet (20 mg total) by mouth daily. 03/27/17 03/27/18  Rebecka Apley, MD  medroxyPROGESTERone (DEPO-PROVERA) 150 MG/ML injection Inject 150 mg into the muscle every 3 (three) months.    [provider]  methimazole (TAPAZOLE) 10 MG tablet Take 2 tablets (20 mg total) by mouth 2 (two) times daily. 02/18/17   Renae Fickle, MD    Allergies Patient has no known allergies.  Family History  Problem Relation Age of Onset  . Thyroid disease Mother   . Hypertension Father   . Stroke Father   . Diabetes Brother   . Hypertension Brother     Social History Social History  Substance Use Topics  . Smoking status: Current Every Day Smoker    Packs/day: 0.10    Years: 3.00    Types: Cigarettes  . Smokeless tobacco: Never Used  . Alcohol use No    Review of Systems  Constitutional: No fever/chills Eyes: No visual changes. ENT: No sore throat. Cardiovascular: Denies chest pain. Respiratory: Denies shortness of breath. Gastrointestinal: No abdominal pain.  No nausea, no vomiting.  No diarrhea.  No constipation. Genitourinary: Negative for dysuria. Musculoskeletal: Negative for back pain. Skin: Negative for rash. Neurological: Negative for headaches, focal weakness or numbness. Lymph: Foot and ankle swelling  ____________________________________________   PHYSICAL EXAM:  VITAL SIGNS: ED Triage Vitals [03/26/17 2120]  Enc Vitals Group     BP (!) 155/78  Pulse Rate 86     Resp 16     Temp 98 F (36.7 C)     Temp Source Oral     SpO2 97 %     Weight (!) 320 lb (145.2 kg)     Height      Head Circumference      Peak Flow      Pain Score      Pain Loc      Pain Edu?      Excl. in GC?     Constitutional: Alert and oriented. Well appearing and in mild distress. Eyes: Conjunctivae are normal. PERRL. EOMI. Head: Atraumatic. Nose: No congestion/rhinnorhea. Mouth/Throat: Mucous  membranes are moist.  Oropharynx non-erythematous. Cardiovascular: Normal rate, regular rhythm. Grossly normal heart sounds.  Good peripheral circulation. Respiratory: Normal respiratory effort.  No retractions. Lungs CTAB. Gastrointestinal: Soft and nontender. No distention. Positive bowel sounds Musculoskeletal: swelling to bilateral feet and ankles, no pitting edema.   Neurologic:  Normal speech and language.  Skin:  Skin is warm, dry and intact.  Psychiatric: Mood and affect are normal.   ____________________________________________   LABS (all labs ordered are listed, but only abnormal results are displayed)  Labs Reviewed  BASIC METABOLIC PANEL - Abnormal; Notable for the following:       Result Value   Calcium 8.7 (*)    All other components within normal limits  CBC  BRAIN NATRIURETIC PEPTIDE  TROPONIN I   ____________________________________________  EKG  none ____________________________________________  RADIOLOGY  none ____________________________________________   PROCEDURES  Procedure(s) performed: None  Procedures  Critical Care performed: No  ____________________________________________   INITIAL IMPRESSION / ASSESSMENT AND PLAN / ED COURSE  Pertinent labs & imaging results that were available during my care of the patient were reviewed by me and considered in my medical decision making (see chart for details).  This is a 31 year old female who comes into the hospital today with foot swelling and ankle swelling. The patient was actually admitted to the hospital in April with thyroid storm and had worse swelling at that time. The patient told the nurse out front that she has been having this swelling for some time. She also has a history of high blood pressure and has not been taking any medicines for it. We did check some blood work on the patient and it was unremarkable. Since the patient did have this with her thyroid storm I did not do an  ultrasound. I feel that the patient needs to get some of the fluid off. I will give the patient a prescription for Lasix. The rest of the patient's workup is unremarkable. She needs to follow up with the acute care clinic. The patient be discharged home.      ____________________________________________   FINAL CLINICAL IMPRESSION(S) / ED DIAGNOSES  Final diagnoses:  Peripheral edema  Paresthesia      NEW MEDICATIONS STARTED DURING THIS VISIT:  Discharge Medication List as of 03/27/2017  2:47 AM    START taking these medications   Details  furosemide (LASIX) 20 MG tablet Take 1 tablet (20 mg total) by mouth daily., Starting Sun 03/27/2017, Until Mon 03/27/2018, Print         Note:  This document was prepared using Dragon voice recognition software and may include unintentional dictation errors.    Rebecka ApleyWebster, Ilamae Geng P, MD 03/27/17 [phone]

## 2017-06-10 ENCOUNTER — Telehealth: Payer: Self-pay

## 2017-06-10 NOTE — Telephone Encounter (Signed)
Patient is requesting to establish care. Medications currently are Tapazole. Insurance is Medicaid. CB# [phone]

## 2017-06-14 NOTE — Telephone Encounter (Signed)
-----   Message from Trey SailorsAdriana M Pollak, [place]PA-C sent at 06/13/2017  9:22 PM EDT ----- She can establish. Thanks.

## 2017-06-22 ENCOUNTER — Encounter: Payer: Self-pay | Admitting: Physician Assistant

## 2017-06-22 ENCOUNTER — Ambulatory Visit (INDEPENDENT_AMBULATORY_CARE_PROVIDER_SITE_OTHER): Payer: Medicaid Other | Admitting: Physician Assistant

## 2017-06-22 VITALS — BP 138/88 | HR 88 | Temp 98.3°F | Resp 20 | Wt 345.0 lb

## 2017-06-22 DIAGNOSIS — Z13220 Encounter for screening for lipoid disorders: Secondary | ICD-10-CM

## 2017-06-22 DIAGNOSIS — M545 Low back pain, unspecified: Secondary | ICD-10-CM

## 2017-06-22 DIAGNOSIS — R7303 Prediabetes: Secondary | ICD-10-CM

## 2017-06-22 DIAGNOSIS — I10 Essential (primary) hypertension: Secondary | ICD-10-CM

## 2017-06-22 DIAGNOSIS — Z0000 Encounter for general adult medical examination without abnormal findings: Secondary | ICD-10-CM

## 2017-06-22 DIAGNOSIS — E0500 Thyrotoxicosis with diffuse goiter without thyrotoxic crisis or storm: Secondary | ICD-10-CM | POA: Diagnosis not present

## 2017-06-22 DIAGNOSIS — Z6841 Body Mass Index (BMI) 40.0 and over, adult: Secondary | ICD-10-CM | POA: Diagnosis not present

## 2017-06-22 DIAGNOSIS — G8929 Other chronic pain: Secondary | ICD-10-CM

## 2017-06-22 NOTE — Progress Notes (Signed)
Patient: Kimberly Nolan Female    DOB: 02/13/1986   31 y.o.   MRN: [id number] Visit Date: 06/23/2017  Today's Provider: Trey SailorsAdriana M Pollak, PA-C   Chief Complaint  Patient presents with  . Establish Care  . Graves' Disease    Needs referral to a new endocrinologist  . Back Pain    Lower back pain; has been going on for a long time.  Recently worsening now her legs are going numb.    Subjective:      Kimberly Nolan is a 31 y/o woman presenting today to establish care. She previously [facility] [place], but she has left the clinic. She presents here with her husband Debby Budndre today.  She lives in [place]. She has one 64seven year old daughter. She does not work. She reports she is sexually active, not using protection. She says she is not trying to get pregnant, but if she were to that it would be welcomed. She reports she is smoking 10 cigarettes a day.   She has a longstanding history of poorly controlled Grave's disease. [facility] for this. She has been on varying doses of methimazole and at times propanolol for her symptoms. She reports she has not taken methimazole in one month. She has inconsistent follow up and compliance with medications. She has been hospitalized multiple times for thyrotoxicosis. Most recently, she saw Dr. Chaney MallingAbbi in 10/2015 for consult about Grave's. It was recommended to pursue thyroidectomy 2/2 severity of her symptoms. She was also instructed not to become pregnant with uncontrolled hyperthyroidism. No follow up since then. She reports she would like to pursue thyroidectomy today and needs a referral for endocrine. Today, she says she does not have trouble swallowing, diarrhea, heat intolerance, or palpitations.  She is also reporting lower back pain x 9 years. Says the pain is constant in her lumbar spine and it spreads down to her right thigh, not sharp/shooting. Worse with standing. Has tried  Meloxicam, not helpful. This has been addressed previously by her PCP, who has assigned weight loss and exercise program. She feels this hasn't addressed the true cause and she really wants to know why this is happening. Some prior talk of going to orthopedics, never referred. Interested in seeing orthopedics today.  Back Pain  This is a chronic problem. The current episode started more than 1 year ago. The pain is present in the lumbar spine. The quality of the pain is described as burning. The pain radiates to the right thigh. The pain is at a severity of 6/10. The pain is the same all the time. The symptoms are aggravated by standing. She has tried NSAIDs for the symptoms. The treatment provided no relief.       No Known Allergies   Current Outpatient Prescriptions:  .  methimazole (TAPAZOLE) 10 MG tablet, Take 2 tablets (20 mg total) by mouth 2 (two) times daily., Disp: 120 tablet, Rfl: 0  Review of Systems  Musculoskeletal: Positive for back pain.    Social History  Substance Use Topics  . Smoking status: Current Every Day Smoker    Packs/day: 0.10    Years: 3.00    Types: Cigarettes  . Smokeless tobacco: Never Used  . Alcohol use Yes     Comment: Occasionally   Objective:   BP 138/88 (BP Location: Right Wrist, Patient Position: Sitting, Cuff Size: Normal)   Pulse 88  Temp 98.3 F (36.8 C) (Oral)   Resp 20   Wt (!) 345 lb (156.5 kg)   LMP 06/15/2017   BMI 54.03 kg/m  Vitals:   06/22/17 1016  BP: 138/88  Pulse: 88  Resp: 20  Temp: 98.3 F (36.8 C)  TempSrc: Oral  Weight: (!) 345 lb (156.5 kg)     Physical Exam  Constitutional: She is oriented to person, place, and time. She appears well-developed and well-nourished. No distress.  HENT:  Right Ear: Tympanic membrane and external ear normal.  Left Ear: Tympanic membrane and external ear normal.  Mouth/Throat: Oropharynx is clear and moist. No oropharyngeal exudate.  Eyes: Conjunctivae are normal.  Neck:  Neck supple. No tracheal deviation present. Thyromegaly present.  Cardiovascular: Normal rate and regular rhythm.   Pulmonary/Chest: Effort normal and breath sounds normal.  Abdominal: Soft. Bowel sounds are normal.  Musculoskeletal: She exhibits no edema, tenderness or deformity.       Lumbar back: Normal.  Lymphadenopathy:    She has no cervical adenopathy.  Neurological: She is alert and oriented to person, place, and time.  Skin: Skin is warm and dry.  Psychiatric: She has a normal mood and affect. Her behavior is normal.        Assessment & Plan:     1. Annual physical exam   2. Essential hypertension  138/88 with pulse 86 today and low symptoms per patient. Will hold off on propanolol.   3. Graves disease  Says she has been out of methimazole for one month, but last prescription I see is from ER visit on 02/2017 for two month supply, so it seems she's been out for two months. Large goiter but mentating well and does not appear thyrotoxic today. Have reviewed previous notes with endocrinology who have advised against pregnancy in the setting of uncontrolled hyperthyroidism and counseled on necessity of switching to PTU during first trimester. I have reiterated that fact. She was seen by Dr. Chaney Malling at [place] two years ago. She should still be patient there, but will place new referral for either Dr. Tedd Sias or Dr. Gershon Crane.   - Ambulatory referral to Endocrinology - TSH+T4F+T3Free  4. Morbid obesity with BMI of 50.0-59.9, adult (HCC)  HH lifestyle and exercise needed.  5. Prediabetes  - Hemoglobin A1c  6. Chronic midline low back pain without sciatica  Reviewed Lumbar spine Xray from 2016 wit mild degenerative facet spurring at L4-L5 but otherwise normal. Do think weight is significant contributor to back pain. She wants to be evaluated by orthopedics and so will refer.   - Ambulatory referral to Orthopedics  7. Lipid screening  - Lipid Profile  Return in about 1  year (around 06/22/2018) for CPE.  The entirety of the information documented in the History of Present Illness, Review of Systems and Physical Exam were personally obtained by me. Portions of this information were initially documented by Kavin Leech, CMA and reviewed by me for thoroughness and accuracy.         Trey Sailors, PA-C  [facility]

## 2017-06-22 NOTE — Patient Instructions (Signed)

## 2017-06-24 ENCOUNTER — Encounter: Payer: Self-pay | Admitting: Physician Assistant

## 2017-07-13 ENCOUNTER — Emergency Department
Admission: EM | Admit: 2017-07-13 | Discharge: 2017-07-13 | Disposition: A | Discharge status: Home / Self Care | Payer: Medicaid Other | Attending: Emergency Medicine | Admitting: Emergency Medicine

## 2017-07-13 ENCOUNTER — Encounter: Payer: Self-pay | Admitting: Emergency Medicine

## 2017-07-13 DIAGNOSIS — F17210 Nicotine dependence, cigarettes, uncomplicated: Secondary | ICD-10-CM | POA: Insufficient documentation

## 2017-07-13 DIAGNOSIS — L02215 Cutaneous abscess of perineum: Secondary | ICD-10-CM | POA: Diagnosis present

## 2017-07-13 DIAGNOSIS — Z79899 Other long term (current) drug therapy: Secondary | ICD-10-CM | POA: Diagnosis not present

## 2017-07-13 DIAGNOSIS — I10 Essential (primary) hypertension: Secondary | ICD-10-CM | POA: Diagnosis not present

## 2017-07-13 DIAGNOSIS — L0291 Cutaneous abscess, unspecified: Secondary | ICD-10-CM

## 2017-07-13 MED ORDER — CLINDAMYCIN PHOSPHATE 600 MG/4ML IJ SOLN
600.0000 mg | Freq: Once | INTRAMUSCULAR | Status: DC
  Filled 2017-07-13: qty 4

## 2017-07-13 MED ORDER — CLINDAMYCIN HCL 300 MG PO CAPS: 300.0000 mg | ORAL_CAPSULE | Freq: Four times a day (QID) | ORAL | 0 refills | Status: AC

## 2017-07-13 MED ORDER — OXYCODONE-ACETAMINOPHEN 5-325 MG PO TABS
1.0000 | ORAL_TABLET | Freq: Once | ORAL | Status: AC
  Administered 2017-07-13: 1 via ORAL
  Filled 2017-07-13: qty 1

## 2017-07-13 MED ORDER — CLINDAMYCIN PHOSPHATE 600 MG/4ML IJ SOLN
600.0000 mg | Freq: Once | INTRAMUSCULAR | Status: AC
Start: 2017-07-13 — End: 2017-07-13
  Administered 2017-07-13: 600 mg via INTRAMUSCULAR
  Filled 2017-07-13: qty 4

## 2017-07-13 MED ORDER — HYDROCODONE-ACETAMINOPHEN 5-325 MG PO TABS: 1.0000 | ORAL_TABLET | Freq: Four times a day (QID) | ORAL | 0 refills | Status: DC | PRN

## 2017-07-13 NOTE — ED Provider Notes (Signed)
[facility] Emergency Department Provider Note   ____________________________________________   I have reviewed the triage vital signs and the nursing notes.   HISTORY  Chief Complaint Abscess    HPI Kimberly Nolan is a 31 y.o. female presents to the emergency department with an abscess distal to the vaginal opening patient reports developing 2 days ago. Patient reports over the last 24 hours worsening pain without drainage from the abscess. Patient denies any vaginal drainage as well. Patient denies fever, chills, headache, vision changes, chest pain, chest tightness, shortness of breath, abdominal pain, nausea and vomiting.  Past Medical History:  Diagnosis Date  . Essential hypertension   . Graves disease   . Morbid obesity (HCC)   . Smoker     Patient Active Problem List   Diagnosis Date Noted  . 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 Thyroiditis 02/17/2017  . Goiter 02/17/2017  . Hyperglycemia 02/17/2017  . Prediabetes 02/17/2017  . Morbid obesity (HCC) 02/17/2017  . Hyperthyroidism 02/16/2017  . Graves disease 02/16/2017  . Essential hypertension 02/16/2017  . Current every day smoker 02/27/2015  . Morbid obesity with BMI of 50.0-59.9, adult (HCC) 02/27/2015  . Shortness of breath 09/13/2012  . CAP (community acquired pneumonia) 09/13/2012  . 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 Leukocytosis 09/13/2012  . Palpitations 09/13/2012    Past Surgical History:  Procedure Laterality Date  . CESAREAN SECTION      Prior to Admission medications   Medication Sig Start Date End Date Taking? Authorizing Provider  clindamycin (CLEOCIN) 300 MG capsule Take 1 capsule (300 mg total) by mouth 4 (four) times daily. 07/13/17 07/23/17  Zuleyma Scharf M, PA-C  HYDROcodone-acetaminophen (NORCO/VICODIN) 5-325 MG tablet Take 1 tablet by mouth every 6 (six) hours as needed for moderate pain. 07/13/17   Mehak Roskelley M, PA-C  methimazole (TAPAZOLE) 10 MG tablet Take 2  tablets (20 mg total) by mouth 2 (two) times daily. 02/18/17   Renae FickleShort, Mackenzie, MD    Allergies Patient has no known allergies.  Family History  Problem Relation Age of Onset  . Thyroid disease Mother   . Hypertension Father   . Stroke Father   . Diabetes Brother   . Hypertension Brother     Social History Social History  Substance Use Topics  . Smoking status: Current Every Day Smoker    Packs/day: 0.10    Years: 3.00    Types: Cigarettes  . Smokeless tobacco: Never Used  . Alcohol use Yes     Comment: Occasionally    Review of Systems Constitutional: Negative for fever/chills Eyes: No visual changes. ENT:  Negative for sore throat and for difficulty swallowing Cardiovascular: Denies chest pain. Respiratory: Denies cough. Denies shortness of breath. Gastrointestinal: No abdominal pain.  No nausea, vomiting, diarrhea. Genitourinary: Negative for dysuria. Musculoskeletal: Negative for back pain. Skin: Negative for rash. Positive for abscess along the perineum distal to the vaginal opening. Neurological: Negative for headaches.  Negative focal weakness or numbness. Negative for loss of consciousness. Able to ambulate. ____________________________________________   PHYSICAL EXAM:  VITAL SIGNS: ED Triage Vitals  Enc Vitals Group     BP 07/13/17 1249 131/82     Pulse Rate 07/13/17 1249 98     Resp 07/13/17 1249 20     Temp 07/13/17 1249 98.9 F (37.2 C)     Temp Source 07/13/17 1249 Oral     SpO2 07/13/17 1249 100 %     Weight 07/13/17 1250 (!) 342 lb (155.1 kg)  Height 07/13/17 1250 5\' 7"  (1.702 m)     Head Circumference --      Peak Flow --      Pain Score 07/13/17 1254 9     Pain Loc --      Pain Edu? --      Excl. in GC? --     Constitutional: Alert and oriented. Well appearing and in no acute distress.  Eyes: Conjunctivae are normal. PERRL. EOMI  Head: Normocephalic and atraumatic. ENT:      Ears: Canals clear. TMs intact bilaterally.      Nose:  No congestion/rhinnorhea.      Mouth/Throat: Mucous membranes are moist.  Neck:Supple. No thyromegaly. No stridor.  Cardiovascular: Normal rate, regular rhythm. Normal S1 and S2.  Good peripheral circulation. Respiratory: Normal respiratory effort without tachypnea or retractions. Lungs CTAB. No wheezes/rales/rhonchi. Good air entry to the bases with no decreased or absent breath sounds. Hematological/Lymphatic/Immunological: No cervical lymphadenopathy. Cardiovascular: Normal rate, regular rhythm. Normal distal pulses. Gastrointestinal: Bowel sounds 4 quadrants. Soft and nontender to palpation. No guarding or rigidity. No palpable masses. No distention. No CVA tenderness. Musculoskeletal: Nontender with normal range of motion in all extremities. Neurologic: Normal speech and language. No gross focal neurologic deficits are appreciated. No gait instability. Cranial nerves: II-X intact. No sensory loss or abnormal reflexes.  Skin:  Skin is warm, dry and intact. No rash noted. Nonfluctuant abscess along the perineum distal to the vaginal opening with erythema and induration. No drainage. Psychiatric: Mood and affect are normal. Speech and behavior are normal. Patient exhibits appropriate insight and judgement.  ____________________________________________   LABS (all labs ordered are listed, but only abnormal results are displayed)  Labs Reviewed - No data to display ____________________________________________  EKG none ____________________________________________  RADIOLOGY none ____________________________________________   PROCEDURES  Procedure(s) performed: no    Critical Care performed: no ____________________________________________   INITIAL IMPRESSION / ASSESSMENT AND PLAN / ED COURSE  Pertinent labs & imaging results that were available during my care of the patient were reviewed by me and considered in my medical decision making (see chart for details).  Patient  presents to emergency department with abscess along the perineum distal to the vaginal opening. History and physical exam findings are reassuring symptoms are consistent with non-drainable abscess at this time. Abscess presents with erythema and induration without fluctuance. Anaprox initiated in the emergency department with clindamycin 600 mg IM.  Patient will be prescribed clindamycin for an about a coverage and patient be prescribed Vicodin for pain management as needed. Patient given strict return precautions if she notes abscess symptoms worsening. Otherwise, recommended she follow up with her primary care provider. Patient informed of clinical course, understand medical decision-making process, and agree with plan.  _______________________________________________   FINAL CLINICAL IMPRESSION(S) / ED DIAGNOSES  Final diagnoses:  Abscess       NEW MEDICATIONS STARTED DURING THIS VISIT:  New Prescriptions   CLINDAMYCIN (CLEOCIN) 300 MG CAPSULE    Take 1 capsule (300 mg total) by mouth 4 (four) times daily.   HYDROCODONE-ACETAMINOPHEN (NORCO/VICODIN) 5-325 MG TABLET    Take 1 tablet by mouth every 6 (six) hours as needed for moderate pain.     Note:  This document was prepared using Dragon voice recognition software and may include unintentional dictation errors.    Clois Comber, PA-C 07/13/17 1400    Jeanmarie Plant, MD 07/13/17 1425

## 2017-07-13 NOTE — Discharge Instructions (Signed)
Take medication as prescribed. Return to emergency department if symptoms worsen and follow-up with PCP as needed.    Continue to monitor the abscess. If you notice signs of worsening infection to include increasing redness, swelling or severe pain due to hesitate to return to the emergency department.

## 2017-07-13 NOTE — ED Triage Notes (Signed)
Patient presents to ED via POV from home. Patient states, "I think I have an abscess or a boil on my vagina". Patient denies vaginal discharge.

## 2017-07-19 ENCOUNTER — Encounter: Payer: Self-pay | Admitting: Physician Assistant

## 2017-07-28 ENCOUNTER — Emergency Department
Admission: EM | Admit: 2017-07-28 | Discharge: 2017-07-28 | Disposition: A | Discharge status: Home / Self Care | Payer: Medicaid Other | Attending: Emergency Medicine | Admitting: Emergency Medicine

## 2017-07-28 ENCOUNTER — Encounter: Payer: Self-pay | Admitting: Emergency Medicine

## 2017-07-28 DIAGNOSIS — F17210 Nicotine dependence, cigarettes, uncomplicated: Secondary | ICD-10-CM | POA: Diagnosis not present

## 2017-07-28 DIAGNOSIS — R7303 Prediabetes: Secondary | ICD-10-CM | POA: Insufficient documentation

## 2017-07-28 DIAGNOSIS — M5441 Lumbago with sciatica, right side: Secondary | ICD-10-CM | POA: Insufficient documentation

## 2017-07-28 DIAGNOSIS — I10 Essential (primary) hypertension: Secondary | ICD-10-CM | POA: Insufficient documentation

## 2017-07-28 DIAGNOSIS — G8929 Other chronic pain: Secondary | ICD-10-CM

## 2017-07-28 DIAGNOSIS — M545 Low back pain: Secondary | ICD-10-CM | POA: Diagnosis present

## 2017-07-28 DIAGNOSIS — E0590 Thyrotoxicosis, unspecified without thyrotoxic crisis or storm: Secondary | ICD-10-CM | POA: Insufficient documentation

## 2017-07-28 MED ORDER — CYCLOBENZAPRINE HCL 10 MG PO TABS
10.0000 mg | ORAL_TABLET | Freq: Once | ORAL | Status: AC
  Administered 2017-07-28: 10 mg via ORAL
  Filled 2017-07-28: qty 1

## 2017-07-28 MED ORDER — CYCLOBENZAPRINE HCL 10 MG PO TABS: 10.0000 mg | ORAL_TABLET | Freq: Three times a day (TID) | ORAL | 0 refills | Status: AC | PRN

## 2017-07-28 MED ORDER — MELOXICAM 7.5 MG PO TABS
15.0000 mg | ORAL_TABLET | Freq: Once | ORAL | Status: AC
  Administered 2017-07-28: 15 mg via ORAL
  Filled 2017-07-28: qty 2

## 2017-07-28 MED ORDER — MELOXICAM 15 MG PO TABS: 15.0000 mg | ORAL_TABLET | Freq: Every day | ORAL | 1 refills | Status: AC

## 2017-07-28 NOTE — ED Triage Notes (Addendum)
Pt c/o burning mid lower back pain x several weeks that got worse this morning. Pt states she is having difficulty standing. Pt also reports numbness in her right leg with occasional numbness. Sitting hurts worst. Pt ambulatory but having severe pain while walking.

## 2017-07-28 NOTE — ED Provider Notes (Signed)
[facility] Emergency Department Provider Note  ____________________________________________  Time seen: Approximately 3:38 PM  I have reviewed the triage vital signs and the nursing notes.   HISTORY  Chief Complaint Back Pain    HPI Kimberly Nolan is a 31 y.o. female presents to the emergency department with self reported 10 out of 10 chronic low back pain with right lower extremity radiculopathy. Patient states that she was washing dishes earlier this afternoon and felt like she needed to sit down. Patient ambulated into the emergency department without difficulty. She denies falls, recent traumas or bowel or bladder incontinence. Denies prior surgeries to the back. Patient is accompanied by her husband who states that his wife "needs something more for pain."  Past Medical History:  Diagnosis Date  . Essential hypertension   . Graves disease   . Morbid obesity (HCC)   . Smoker     Patient Active Problem List   Diagnosis Date Noted  . 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 Thyroiditis 02/17/2017  . Goiter 02/17/2017  . Hyperglycemia 02/17/2017  . Prediabetes 02/17/2017  . Morbid obesity (HCC) 02/17/2017  . Hyperthyroidism 02/16/2017  . Graves disease 02/16/2017  . Essential hypertension 02/16/2017  . Current every day smoker 02/27/2015  . Morbid obesity with BMI of 50.0-59.9, adult (HCC) 02/27/2015  . Shortness of breath 09/13/2012  . CAP (community acquired pneumonia) 09/13/2012  . 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 Leukocytosis 09/13/2012  . Palpitations 09/13/2012    Past Surgical History:  Procedure Laterality Date  . CESAREAN SECTION      Prior to Admission medications   Medication Sig Start Date End Date Taking? Authorizing Provider  cyclobenzaprine (FLEXERIL) 10 MG tablet Take 1 tablet (10 mg total) by mouth 3 (three) times daily as needed for muscle spasms. 07/28/17 08/02/17  Orvil Feil, PA-C  HYDROcodone-acetaminophen (NORCO/VICODIN)  5-325 MG tablet Take 1 tablet by mouth every 6 (six) hours as needed for moderate pain. 07/13/17   Little, Traci M, PA-C  meloxicam (MOBIC) 15 MG tablet Take 1 tablet (15 mg total) by mouth daily. 07/28/17 08/27/17  Orvil Feil, PA-C  methimazole (TAPAZOLE) 10 MG tablet Take 2 tablets (20 mg total) by mouth 2 (two) times daily. 02/18/17   Renae Fickle, MD    Allergies Patient has no known allergies.  Family History  Problem Relation Age of Onset  . Thyroid disease Mother   . Hypertension Father   . Stroke Father   . Diabetes Brother   . Hypertension Brother     Social History Social History  Substance Use Topics  . Smoking status: Current Every Day Smoker    Packs/day: 0.10    Years: 3.00    Types: Cigarettes  . Smokeless tobacco: Never Used  . Alcohol use Yes     Comment: Occasionally     Review of Systems  Constitutional: No fever/chills Eyes: No visual changes. No discharge ENT: No upper respiratory complaints. Cardiovascular: no chest pain. Respiratory: no cough. No SOB. Gastrointestinal: No abdominal pain.  No nausea, no vomiting.  No diarrhea.  No constipation. Genitourinary: Negative for dysuria. No hematuria Musculoskeletal: Patient has low back pain.  Skin: Negative for rash, abrasions, lacerations, ecchymosis. Neurological: Negative for headaches, focal weakness or numbness.   ____________________________________________   PHYSICAL EXAM:  VITAL SIGNS: ED Triage Vitals  Enc Vitals Group     BP 07/28/17 1348 (!) 145/96     Pulse Rate 07/28/17 1348 87     Resp 07/28/17 1348  18     Temp 07/28/17 1348 98 F (36.7 C)     Temp Source 07/28/17 1348 Oral     SpO2 07/28/17 1347 100 %     Weight 07/28/17 1349 (!) 340 lb (154.2 kg)     Height 07/28/17 1349 5\' 7"  (1.702 m)     Head Circumference --      Peak Flow --      Pain Score 07/28/17 1347 10     Pain Loc --      Pain Edu? --      Excl. in GC? --      Constitutional: Alert and oriented. Well  appearing and in no acute distress. Eyes: Conjunctivae are normal. PERRL. EOMI. Head: Atraumatic. Hematological/Lymphatic/Immunilogical: No cervical lymphadenopathy. Cardiovascular: Normal rate, regular rhythm. Normal S1 and S2.  Good peripheral circulation. Respiratory: Normal respiratory effort without tachypnea or retractions. Lungs CTAB. Good air entry to the bases with no decreased or absent breath sounds. Gastrointestinal: Bowel sounds 4 quadrants. Soft and nontender to palpation. No guarding or rigidity. No palpable masses. No distention. No CVA tenderness. Musculoskeletal: Full range of motion to all extremities. No gross deformities appreciated. Neurologic:  Normal speech and language. No gross focal neurologic deficits are appreciated.  Skin:  Skin is warm, dry and intact. No rash noted. Psychiatric: Mood and affect are normal. Speech and behavior are normal. Patient exhibits appropriate insight and judgement.   ____________________________________________   LABS (all labs ordered are listed, but only abnormal results are displayed)  Labs Reviewed - No data to display ____________________________________________  EKG   ____________________________________________  RADIOLOGY  No results found.  ____________________________________________    PROCEDURES  Procedure(s) performed:    Procedures    Medications  cyclobenzaprine (FLEXERIL) tablet 10 mg (not administered)  meloxicam (MOBIC) tablet 15 mg (not administered)     ____________________________________________   INITIAL IMPRESSION / ASSESSMENT AND PLAN / ED COURSE  Pertinent labs & imaging results that were available during my care of the patient were reviewed by me and considered in my medical decision making (see chart for details).  Review of the [place] CSRS was performed in accordance of the [facility] prior to dispensing any controlled drugs.     Assessment and plan Chronic low back pain Patient  presents to the emergency department with an exacerbation of chronic low back pain. Neurologic exam and overall physical exam was reassuring. Patient's husband became confrontational during exam room when patient was told that she would receive only Flexeril and Mobic for pain and inflammation. Patient is observed yelling at nurse. Patient's husband states that he will just return for "more medicine". Patient was discharged with Mobic and Flexeril for pain and inflammation. All patient questions were answered.    ____________________________________________  FINAL CLINICAL IMPRESSION(S) / ED DIAGNOSES  Final diagnoses:  Chronic right-sided low back pain with right-sided sciatica      NEW MEDICATIONS STARTED DURING THIS VISIT:  New Prescriptions   CYCLOBENZAPRINE (FLEXERIL) 10 MG TABLET    Take 1 tablet (10 mg total) by mouth 3 (three) times daily as needed for muscle spasms.   MELOXICAM (MOBIC) 15 MG TABLET    Take 1 tablet (15 mg total) by mouth daily.        This chart was dictated using voice recognition software/Dragon. Despite best efforts to proofread, errors can occur which can change the meaning. Any change was purely unintentional.    Orvil FeilWoods, Nichalas Coin M, PA-C 07/28/17 1548    Dionne BucySiadecki, Sebastian, MD 07/28/17  2352  

## 2017-07-28 NOTE — ED Notes (Signed)
See triage note  Presents with lower back pain which is moving into right leg  Denies any recent injury but states she has had occasional numbness to right leg  Also denies any fever or urinary sx's

## 2017-08-10 ENCOUNTER — Telehealth: Payer: Self-pay

## 2017-08-10 LAB — HEMOGLOBIN A1C
Est. average glucose Bld gHb Est-mCnc: 108 mg/dL
Hgb A1c MFr Bld: 5.4 % (ref 4.8–5.6)

## 2017-08-10 LAB — TSH+T4F+T3FREE
Free T4: 2.04 ng/dL — ABNORMAL HIGH (ref 0.82–1.77)
T3, Free: 4.9 pg/mL — ABNORMAL HIGH (ref 2.0–4.4)
TSH: <0.006 u[IU]/mL — ABNORMAL LOW (ref 0.450–4.500)

## 2017-08-10 LAB — LIPID PANEL
Chol/HDL Ratio: 3 ratio (ref 0.0–4.4)
Cholesterol, Total: 131 mg/dL (ref 100–199)
HDL: 44 mg/dL (ref >39)
LDL Calculated: 74 mg/dL (ref 0–99)
Triglycerides: 65 mg/dL (ref 0–149)
VLDL Cholesterol Cal: 13 mg/dL (ref 5–40)

## 2017-08-10 NOTE — Telephone Encounter (Signed)
Patient advised. She states she scheduled a appointment today with Endo. It is scheduled for 10/17.

## 2017-08-10 NOTE — Telephone Encounter (Signed)
LMTCB  08/10/2017  Thanks,   -Vernona Rieger

## 2017-08-10 NOTE — Telephone Encounter (Signed)
-----   Message from Trey Sailors, [place] sent at 08/10/2017  9:09 AM EDT ----- TSH lowest it's been in 4 years. Indicates uncontrolled hyperthyroidism. Really needs to get in touch with [facility] endocrinology, they have been calling to arrange appointment. A1C increased from previous, it is on high end of normal. Cholesterol normal.

## 2017-09-13 ENCOUNTER — Emergency Department
Admission: EM | Admit: 2017-09-13 | Discharge: 2017-09-13 | Disposition: A | Discharge status: Home / Self Care | Payer: Medicaid Other | Attending: Emergency Medicine | Admitting: Emergency Medicine

## 2017-09-13 ENCOUNTER — Emergency Department: Payer: Medicaid Other

## 2017-09-13 DIAGNOSIS — I10 Essential (primary) hypertension: Secondary | ICD-10-CM | POA: Insufficient documentation

## 2017-09-13 DIAGNOSIS — R0789 Other chest pain: Secondary | ICD-10-CM | POA: Diagnosis not present

## 2017-09-13 DIAGNOSIS — F17210 Nicotine dependence, cigarettes, uncomplicated: Secondary | ICD-10-CM | POA: Insufficient documentation

## 2017-09-13 NOTE — ED Notes (Signed)
Pt states that she is having chest pain and discomfort. Started a couple of days ago. Pt seems lethargic and does not respond immediately to questions.

## 2017-09-13 NOTE — ED Triage Notes (Signed)
Patient reports symptoms for 2 days.  Patient reports it hurts to take a breath.  Patient is speaking in complete sentences without difficulty, no respiratory distress noted.

## 2017-09-13 NOTE — ED Provider Notes (Signed)
[facility] Emergency Department Provider Note       Time seen: ----------------------------------------- 7:02 AM on 09/13/2017 -----------------------------------------     I have reviewed the triage vital signs and the nursing notes.   HISTORY   Chief Complaint Pain with Breathing    HPI Kimberly Nolan is a 31 y.o. female with a history of hypertension who presents to the ED for chest pain. Patient reports some pain in the left side of her chest that seemed to be worse when she coughs or breathes. At some point here she complained of pain 8 out of 10, she currently has no pain and is resting comfortably.  Past Medical History:  Diagnosis Date  . Essential hypertension   . Graves disease   . Morbid obesity (HCC)   . Smoker     Patient Active Problem List   Diagnosis Date Noted  . 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 Thyroiditis 02/17/2017  . Goiter 02/17/2017  . Hyperglycemia 02/17/2017  . Prediabetes 02/17/2017  . Morbid obesity (HCC) 02/17/2017  . Hyperthyroidism 02/16/2017  . Graves disease 02/16/2017  . Essential hypertension 02/16/2017  . Current every day smoker 02/27/2015  . Morbid obesity with BMI of 50.0-59.9, adult (HCC) 02/27/2015  . Shortness of breath 09/13/2012  . CAP (community acquired pneumonia) 09/13/2012  . 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 Leukocytosis 09/13/2012  . Palpitations 09/13/2012    Past Surgical History:  Procedure Laterality Date  . CESAREAN SECTION      Allergies Penicillins  Social History Social History  Substance Use Topics  . Smoking status: Current Every Day Smoker    Packs/day: 0.10    Years: 3.00    Types: Cigarettes  . Smokeless tobacco: Never Used  . Alcohol use Yes     Comment: Occasionally    Review of Systems Constitutional: Negative for fever. Cardiovascular: positive for chest pain Respiratory: positive for cough Gastrointestinal: Negative for abdominal pain Musculoskeletal:  Negative for back pain. Neurological: Negative for headaches, focal weakness or numbness.  All systems negative/normal/unremarkable except as stated in the HPI  ____________________________________________   PHYSICAL EXAM:  VITAL SIGNS: ED Triage Vitals  Enc Vitals Group     BP 09/13/17 0448 (!) 143/71     Pulse Rate 09/13/17 0448 72     Resp 09/13/17 0448 20     Temp 09/13/17 0448 97.9 F (36.6 C)     Temp Source 09/13/17 0448 Oral     SpO2 09/13/17 0448 99 %     Weight 09/13/17 0446 (!) 342 lb (155.1 kg)     Height 09/13/17 0446 5\' 6"  (1.676 m)     Head Circumference --      Peak Flow --      Pain Score 09/13/17 0446 8     Pain Loc --      Pain Edu? --      Excl. in GC? --     Constitutional: drowsy, Well appearing and in no distress. ENT   Head: Normocephalic and atraumatic. Cardiovascular: Normal rate, regular rhythm. No murmurs, rubs, or gallops. Respiratory: Normal respiratory effort without tachypnea nor retractions. Breath sounds are clear and equal bilaterally. No wheezes/rales/rhonchi. Musculoskeletal: Nontender with normal range of motion in extremities. No lower extremity tenderness nor edema.reproducible left chest wall tenderness Neurologic:  Normal speech and language. No gross focal neurologic deficits are appreciated.  ____________________________________________  EKG: Interpreted by me. Sinus rhythm rate of 69 bpm, normal PR interval, normal QRS, normal QT.  ____________________________________________  ED COURSE:  Pertinent labs & imaging results that were available during my care of the patient were reviewed by me and considered in my medical decision making (see chart for details). Patient presents for chest wall pain, we will assess with imaging   Procedures ____________________________________________   RADIOLOGY  chest x-ray is normal  ____________________________________________  DIFFERENTIAL DIAGNOSIS   costochondritis, chest  wall pain, pneumonia, bronchitis   FINAL ASSESSMENT AND PLAN  chest wall pain   Plan: Patient had presented for chest wall pain. Patients imaging was reassuring. Patient states she felt better and did not want any further treatment. Patient states "is wobbly here for a while". She currently has no complaints and appears stable for discharge.   Emily FilbertWilliams, Jonathan E, MD   Note: This note was generated in part or whole with voice recognition software. Voice recognition is usually quite accurate but there are transcription errors that can and very often do occur. I apologize for any typographical errors that were not detected and corrected.     Emily FilbertWilliams, Jonathan E, MD 09/13/17 [phone]

## 2017-09-15 ENCOUNTER — Emergency Department
Admission: EM | Admit: 2017-09-15 | Discharge: 2017-09-15 | Disposition: A | Discharge status: Home / Self Care | Payer: Medicaid Other | Attending: Emergency Medicine | Admitting: Emergency Medicine

## 2017-09-15 DIAGNOSIS — I10 Essential (primary) hypertension: Secondary | ICD-10-CM | POA: Insufficient documentation

## 2017-09-15 DIAGNOSIS — Z79899 Other long term (current) drug therapy: Secondary | ICD-10-CM | POA: Insufficient documentation

## 2017-09-15 DIAGNOSIS — K047 Periapical abscess without sinus: Secondary | ICD-10-CM | POA: Diagnosis not present

## 2017-09-15 DIAGNOSIS — F17210 Nicotine dependence, cigarettes, uncomplicated: Secondary | ICD-10-CM | POA: Diagnosis not present

## 2017-09-15 DIAGNOSIS — R6884 Jaw pain: Secondary | ICD-10-CM | POA: Diagnosis present

## 2017-09-15 DIAGNOSIS — R600 Localized edema: Secondary | ICD-10-CM | POA: Insufficient documentation

## 2017-09-15 MED ORDER — AMOXICILLIN-POT CLAVULANATE 875-125 MG PO TABS: 1.0000 | ORAL_TABLET | Freq: Two times a day (BID) | ORAL | 0 refills | Status: AC

## 2017-09-15 MED ORDER — HYDROCODONE-ACETAMINOPHEN 5-325 MG PO TABS: 1.0000 | ORAL_TABLET | Freq: Four times a day (QID) | ORAL | 0 refills | Status: DC | PRN

## 2017-09-15 MED ORDER — AMOXICILLIN-POT CLAVULANATE 875-125 MG PO TABS
1.0000 | ORAL_TABLET | Freq: Once | ORAL | Status: AC
  Administered 2017-09-15: 1 via ORAL
  Filled 2017-09-15: qty 1

## 2017-09-15 MED ORDER — HYDROCODONE-ACETAMINOPHEN 5-325 MG PO TABS
1.0000 | ORAL_TABLET | Freq: Once | ORAL | Status: AC
  Administered 2017-09-15: 1 via ORAL
  Filled 2017-09-15: qty 1

## 2017-09-15 NOTE — ED Triage Notes (Signed)
Pt in with co toothache states has had it for weeks but got severe tonight.

## 2017-09-15 NOTE — ED Notes (Signed)
Dr. Quale at the bedside for pt evaluation.  

## 2017-09-15 NOTE — Discharge Instructions (Signed)
You have been seen in the Emergency Department (ED) today for dental pain.  Please take your prescribed antibiotic.  You may take pain medication as needed but ONLY as prescribed.  You should also take over-the-counter pain medication such as ibuprofen according to the label instructions unless a doctor has previously told you to avoid this type of medication (due to stomach ulcers, for example).  Please see you dentist as soon as possible; only a dentist will be able to fix your problem(s).  Please see below for dental follow up options.  Return to the ED if you develop worsening pain, fever, pus/drainage, difficulty breathing, or other symptoms that concern you.  OPTIONS FOR DENTAL FOLLOW UP CARE  [place] Department of Health and Human Services - Local Safety Net Dental Clinics http://www.ncdhhs.gov/dph/oralhealth/services/safetynetclinics.htm   [facility] ([phone])  [facility] ([phone])  [facility] ([phone])  [facility] ([phone])  [facility] ([phone]) This clinic caters to the indigent population and is on a lottery system. Location: [facility], [address], [place] Clinic Hours: Wednesdays from 6pm - 9pm, patients seen by a lottery system. For dates, call or go to [email or web address].[facility].edu/[facility]/patients/Dental-[facility] Services: Cleanings, fillings and simple extractions. Payment Options: DENTAL WORK IS FREE OF CHARGE. Bring proof of income or support. Best way to get seen: Arrive at 5:15 pm - this is a lottery, NOT first come/first serve, so arriving earlier will not increase your chances of being seen.     [facility] [phone] Select option 1 for emergencies   Location: [facility], [address], [place] Clinic Hours: No walk-ins accepted - call the day before to schedule an appointment. Check  in times are 9:30 am and 1:30 pm. Services: Simple extractions, temporary fillings, pulpectomy/pulp debridement, uncomplicated abscess drainage. Payment Options: PAYMENT IS DUE AT THE TIME OF SERVICE.  Fee is usually $100-200, additional surgical procedures (e.g. abscess drainage) may be extra. Cash, checks, Visa/MasterCard accepted.  Can file Medicaid if patient is covered for dental - patient should call case worker to check. No discount for [facility] patients. Best way to get seen: MUST call the day before and get onto the schedule. Can usually be seen the next 1-2 days. No walk-ins accepted.     [facility] [phone]   Location: [facility], [address], [place] Clinic Hours: M, W, Th, F 8am or 1:30pm, Tues 9a or 1:30 - first come/first served. Services: Simple extractions, temporary fillings, uncomplicated abscess drainage.  You do not need to be an [place] resident. Payment Options: PAYMENT IS DUE AT THE TIME OF SERVICE. Dental insurance, otherwise sliding scale - bring proof of income or support. Depending on income and treatment needed, cost is usually $50-200. Best way to get seen: Arrive early as it is first come/first served.     [facility] [phone]   Location: [address] Clinic Hours: Mon-Thu 8a-5p Services: Most basic dental services including extractions and fillings. Payment Options: PAYMENT IS DUE AT THE TIME OF SERVICE. Sliding scale, up to 50% off - bring proof if income or support. Medicaid with dental option accepted. Best way to get seen: Call to schedule an appointment, can usually be seen within 2 weeks OR they will try to see walk-ins - show up at 8a or 2p (you may have to wait).       [facility] [phone] [place] RESIDENTS ONLY   Location: [facility], [address], [place], [place]  [address] Clinic Hours: By appointment only. Monday - Thursday 8am-5pm, Friday 8am-12pm Services: Cleanings, fillings, extractions. Payment Options: PAYMENT IS DUE AT THE TIME OF SERVICE. Cash, Visa or MasterCard. Sliding scale - $30 minimum per service. Best way to get seen: Come in to office, complete packet and make an appointment - need proof of income or support monies for each household member and proof of [place] residence. Usually takes about a month to get in.     [facility] [phone]   Location: [address] [place] Clinic Hours: Walk-in Urgent Care Dental Services are offered Monday-Friday mornings only. The numbers of emergencies accepted daily is limited to the number of providers available. Maximum 15 - Mondays, Wednesdays & Thursdays Maximum 10 - Tuesdays & Fridays Services: You do not need to be a [place] resident to be seen for a dental emergency. Emergencies are defined as pain, swelling, abnormal bleeding, or dental trauma. Walkins will receive x-rays if needed. NOTE: Dental cleaning is not an emergency. Payment Options: PAYMENT IS DUE AT THE TIME OF SERVICE. Minimum co-pay is $40.00 for uninsured patients. Minimum co-pay is $3.00 for Medicaid with dental coverage. Dental Insurance is accepted and must be presented at time of visit. Medicare does not cover dental. Forms of payment: Cash, credit card, checks. Best way to get seen: If not previously registered with the clinic, walk-in dental registration begins at 7:15 am and is on a first come/first serve basis. If previously registered with the clinic, call to make an appointment.     [facility] [phone] [place] RESIDENTS ONLY   Location: [address], [place], [place] Clinic Hours: Mon-Thu 10a-2p Services: Extractions only! Payment Options: FREE (donations accepted) - bring proof of income or support Best way to get seen: Call  and schedule an appointment OR come at 8am on the 1st Monday of every month (except for holidays) when it is first come/first served.     [facility] [phone]   Location: [address], [place] Clinic Hours: Friday mornings Services, Payment Options, Best way to get seen: Call for info   

## 2017-09-15 NOTE — ED Provider Notes (Signed)
[facility] Emergency Department Provider Note   ____________________________________________   First MD Initiated Contact with Patient 09/15/17 [phone]     (approximate)  I have reviewed the triage vital signs and the nursing notes.   HISTORY  Chief Complaint Dental Pain    HPI Kimberly Nolan is a 31 y.o. female here for evaluation of right upper mouth pain  The patient reports she began experiencing pain in her right upper jaw for about a year, she reports the last several days she has had increasing pain primarily in the right upper mouth.  She reports she has several teeth that she no are bad, but she has not been able to see a dentist.  Today she reports a severe throbbing pain in the right upper mouth.  No trouble breathing, no trouble swallowing.  She reports she does have an allergy to penicillins, but has been able to tolerate medications such as amoxicillin in the past without difficulty.  She denies fevers or chills.  No trouble breathing.  No swelling of the neck or lower jaw.  She feels as though there is some slight swelling around her right upper cheek.  Denies pregnancy.  No chest pain or trouble breathing.  Patient also reports that when she is taking clindamycin in the past is caused severe stomach cramps and diarrhea, she cannot take that medication.  She is a smoker.   Past Medical History:  Diagnosis Date  . Essential hypertension   . Graves disease   . Morbid obesity (HCC)   . Smoker     Patient Active Problem List   Diagnosis Date Noted  . 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 Thyroiditis 02/17/2017  . Goiter 02/17/2017  . Hyperglycemia 02/17/2017  . Prediabetes 02/17/2017  . Morbid obesity (HCC) 02/17/2017  . Hyperthyroidism 02/16/2017  . Graves disease 02/16/2017  . Essential hypertension 02/16/2017  . Current every day smoker 02/27/2015  . Morbid obesity with BMI of 50.0-59.9, adult (HCC) 02/27/2015  . Shortness of breath  09/13/2012  . CAP (community acquired pneumonia) 09/13/2012  . 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 Leukocytosis 09/13/2012  . Palpitations 09/13/2012    Past Surgical History:  Procedure Laterality Date  . CESAREAN SECTION      Prior to Admission medications   Medication Sig Start Date End Date Taking? Authorizing Provider  amoxicillin-clavulanate (AUGMENTIN) 875-125 MG tablet Take 1 tablet by mouth 2 (two) times daily. 09/15/17 09/29/17  Sharyn CreamerQuale, Khaiden Segreto, MD  HYDROcodone-acetaminophen (NORCO/VICODIN) 5-325 MG tablet Take 1 tablet by mouth every 6 (six) hours as needed for moderate pain. 09/15/17   Sharyn CreamerQuale, Zalika Tieszen, MD  methimazole (TAPAZOLE) 10 MG tablet Take 2 tablets (20 mg total) by mouth 2 (two) times daily. 02/18/17   Renae FickleShort, Mackenzie, MD    Allergies Penicillins  Family History  Problem Relation Age of Onset  . Thyroid disease Mother   . Hypertension Father   . Stroke Father   . Diabetes Brother   . Hypertension Brother     Social History Social History  Substance Use Topics  . Smoking status: Current Every Day Smoker    Packs/day: 0.10    Years: 3.00    Types: Cigarettes  . Smokeless tobacco: Never Used  . Alcohol use Yes     Comment: Occasionally    Review of Systems Constitutional: No fever/chills Eyes: No visual changes. ENT: No sore throat.  See HPI regarding mouth pain Cardiovascular: Denies chest pain.  Evidently she reports she was seen yesterday for  chest pain and trouble breathing but this is gone away. Respiratory: Denies shortness of breath. Gastrointestinal: No abdominal pain.  Denies pregnancy  Musculoskeletal: No neck pain or neck swelling. Skin: Negative for rash. Neurological: Negative for headaches, focal weakness or numbness.  Does report pain in the right upper jawline radiates up towards her right face. Endocrine: No increased thirst or urination.    ____________________________________________   PHYSICAL EXAM:  VITAL SIGNS: ED Triage Vitals    Enc Vitals Group     BP 09/15/17 0326 (!) 169/93     Pulse Rate 09/15/17 0326 99     Resp 09/15/17 0326 18     Temp 09/15/17 0326 98.5 F (36.9 C)     Temp Source 09/15/17 0326 Oral     SpO2 09/15/17 0326 96 %     Weight 09/15/17 0325 (!) 330 lb (149.7 kg)     Height 09/15/17 0325 5\' 7"  (1.702 m)     Head Circumference --      Peak Flow --      Pain Score 09/15/17 0325 10     Pain Loc --      Pain Edu? --      Excl. in GC? --     Constitutional: Alert and oriented. Well appearing and in no acute distress.  Her appear in pain, holding her hand over her right upper maxillary region. Eyes: Conjunctivae are normal. Head: Atraumatic. Nose: No congestion/rhinnorhea. Mouth/Throat: Mucous membranes are moist.  The patient has somewhat poor dentition throughout, and she does not report tenderness to palpation along the teeth of the lower jaw, there is no elevation of the tongue, there is no tenderness at the base of the tongue.  There is no oral pharyngeal edema.  The patient denies tenderness in the left upper jawline and to palpation of the left upper teeth, she has severe tenderness especially over a partially fractured and deteriorating right premolar where she also has some localized edema of the gumline without fistulization noted.  There is no gingival erosions.  She does have some mild swelling noted over the right maxillary region and tenderness along the right upper. Neck: No stridor.  No anterior neck swelling or tenderness.  There is no erythema of the neck.  No crepitance.  The larynx is midline without mass. Cardiovascular: Normal rate, regular rhythm. Grossly normal heart sounds.  Good peripheral circulation. Respiratory: Normal respiratory effort.  No retractions. Lungs CTAB. Gastrointestinal: Soft and nontender.  Patient is obese. Musculoskeletal: No lower extremity tenderness nor edema. Neurologic:  Normal speech and language. No gross focal neurologic deficits are appreciated.   Skin:  Skin is warm, dry and intact. No rash noted. Psychiatric: Mood and affect are normal. Speech and behavior are normal.  ____________________________________________   LABS (all labs ordered are listed, but only abnormal results are displayed)  Labs Reviewed - No data to display ____________________________________________  EKG   ____________________________________________  RADIOLOGY   ____________________________________________   PROCEDURES  Procedure(s) performed: None  Procedures  Critical Care performed: No  ____________________________________________   INITIAL IMPRESSION / ASSESSMENT AND PLAN / ED COURSE  Pertinent labs & imaging results that were available during my care of the patient were reviewed by me and considered in my medical decision making (see chart for details).  Patient presents for evaluation of dental pain.  She has localized pain in the right upper mandible, she has an obviously eroded premolar with some localized edema and maxillary swelling.  She has no evidence of  deep space infection/Ludewig's angina.  There is no involvement in the lower jawline or neck she has no respiratory involvement, and the oropharynx is clear.  Afebrile in no acute distress except for appears in pain.    Patient has a likely dental abscess located in the right upper mouth.  Does not appear to be complicated by fever or evidence of deep tissue infection at this time.  No evidence of Ludewig's angina.  Discussed with the patient I will treat her with Vicodin short-term for pain relief, and we carefully reviewed her history of allergy  She reports with confidence that she is not allergic to amoxicillin and has taken that in the past without difficulty.  I will place her on Augmentin.  Reviewed the prescriber drug B database, only one previous prescription for Vicodin noted in August.  I will prescribe the patient a narcotic pain medicine due to their condition  which I anticipate will cause at least moderate pain short term. I discussed with the patient safe use of narcotic pain medicines, and that they are not to drive, work in dangerous areas, or ever take more than prescribed (no more than 1 pill every 6 hours). We discussed that this is the type of medication that can be  overdosed on and the risks of this type of medicine. Patient is very agreeable to only use as prescribed and to never use more than prescribed.  Return precautions and treatment recommendations and follow-up discussed with the patient who is agreeable with the plan. I have given the patient very careful return precautions including if she is to develop any trouble breathing, difficulty swallowing, high fevers, increasing pain or increasing swelling she should return right away.  She and her boyfriend report that they are be going to the dental clinic Tuesday in [facility].  Her boyfriend is driving her home   ____________________________________________   FINAL CLINICAL IMPRESSION(S) / ED DIAGNOSES  Final diagnoses:  Dental abscess      NEW MEDICATIONS STARTED DURING THIS VISIT:  New Prescriptions   AMOXICILLIN-CLAVULANATE (AUGMENTIN) 875-125 MG TABLET    Take 1 tablet by mouth 2 (two) times daily.   HYDROCODONE-ACETAMINOPHEN (NORCO/VICODIN) 5-325 MG TABLET    Take 1 tablet by mouth every 6 (six) hours as needed for moderate pain.     Note:  This document was prepared using Dragon voice recognition software and may include unintentional dictation errors.     Sharyn Creamer, MD 09/15/17 [phone]

## 2017-10-31 ENCOUNTER — Emergency Department
Admission: EM | Admit: 2017-10-31 | Discharge: 2017-10-31 | Disposition: A | Discharge status: Home / Self Care | Payer: Medicaid Other | Attending: Emergency Medicine | Admitting: Emergency Medicine

## 2017-10-31 ENCOUNTER — Emergency Department: Payer: Medicaid Other

## 2017-10-31 DIAGNOSIS — F17210 Nicotine dependence, cigarettes, uncomplicated: Secondary | ICD-10-CM | POA: Insufficient documentation

## 2017-10-31 DIAGNOSIS — R0789 Other chest pain: Secondary | ICD-10-CM | POA: Diagnosis not present

## 2017-10-31 DIAGNOSIS — Z79899 Other long term (current) drug therapy: Secondary | ICD-10-CM | POA: Insufficient documentation

## 2017-10-31 DIAGNOSIS — I10 Essential (primary) hypertension: Secondary | ICD-10-CM | POA: Insufficient documentation

## 2017-10-31 LAB — BASIC METABOLIC PANEL
ANION GAP: 6 (ref 5–15)
BUN: 11 mg/dL (ref 6–20)
CO2: 27 mmol/L (ref 22–32)
Calcium: 9 mg/dL (ref 8.9–10.3)
Chloride: 105 mmol/L (ref 101–111)
Creatinine, Ser: 0.63 mg/dL (ref 0.44–1.00)
GFR calc Af Amer: >60 mL/min (ref >60)
Glucose, Bld: 119 mg/dL — ABNORMAL HIGH (ref 65–99)
POTASSIUM: 4 mmol/L (ref 3.5–5.1)
SODIUM: 138 mmol/L (ref 135–145)

## 2017-10-31 LAB — CBC
HEMATOCRIT: 39.1 % (ref 35.0–47.0)
HEMOGLOBIN: 13.1 g/dL (ref 12.0–16.0)
MCH: 27.5 pg (ref 26.0–34.0)
MCHC: 33.5 g/dL (ref 32.0–36.0)
MCV: 82 fL (ref 80.0–100.0)
Platelets: 222 10*3/uL (ref 150–440)
RBC: 4.77 MIL/uL (ref 3.80–5.20)
RDW: 13.5 % (ref 11.5–14.5)
WBC: 4.6 10*3/uL (ref 3.6–11.0)

## 2017-10-31 LAB — TROPONIN I

## 2017-10-31 MED ORDER — IBUPROFEN 600 MG PO TABS
600.0000 mg | ORAL_TABLET | Freq: Once | ORAL | Status: AC
  Administered 2017-10-31: 600 mg via ORAL
  Filled 2017-10-31: qty 1

## 2017-10-31 MED ORDER — IBUPROFEN 600 MG PO TABS: 600.0000 mg | ORAL_TABLET | Freq: Three times a day (TID) | ORAL | 0 refills | Status: DC | PRN

## 2017-10-31 NOTE — Discharge Instructions (Signed)
Fortunately today your blood work, your EKG, and your chest x-ray were reassuring.  Please take your anti-inflammatory medicine for the next week and make an appointment to follow-up with your primary care provider in 2 days for recheck.  Return to the emergency department sooner for any concerns whatsoever.  It was a pleasure to take care of you today, and thank you for coming to our emergency department.  If you have any questions or concerns before leaving please ask the nurse to grab me and I'm more than happy to go through your aftercare instructions again.  If you were prescribed any opioid pain medication today such as Norco, Vicodin, Percocet, morphine, hydrocodone, or oxycodone please make sure you do not drive when you are taking this medication as it can alter your ability to drive safely.  If you have any concerns once you are home that you are not improving or are in fact getting worse before you can make it to your follow-up appointment, please do not hesitate to call 911 and come back for further evaluation.  Merrily BrittleNeil Nakaiya Beddow, MD  Results for orders placed or performed during the hospital encounter of 10/31/17  Basic metabolic panel  Result Value Ref Range   Sodium 138 135 - 145 mmol/L   Potassium 4.0 3.5 - 5.1 mmol/L   Chloride 105 101 - 111 mmol/L   CO2 27 22 - 32 mmol/L   Glucose, Bld 119 (H) 65 - 99 mg/dL   BUN 11 6 - 20 mg/dL   Creatinine, Ser 4.090.63 0.44 - 1.00 mg/dL   Calcium 9.0 8.9 - 81.110.3 mg/dL   GFR calc non Af Amer >60 >60 mL/min   GFR calc Af Amer >60 >60 mL/min   Anion gap 6 5 - 15  CBC  Result Value Ref Range   WBC 4.6 3.6 - 11.0 K/uL   RBC 4.77 3.80 - 5.20 MIL/uL   Hemoglobin 13.1 12.0 - 16.0 g/dL   HCT 91.439.1 78.235.0 - 95.647.0 %   MCV 82.0 80.0 - 100.0 fL   MCH 27.5 26.0 - 34.0 pg   MCHC 33.5 32.0 - 36.0 g/dL   RDW 21.313.5 08.611.5 - 57.814.5 %   Platelets 222 150 - 440 K/uL  Troponin I  Result Value Ref Range   Troponin I <0.03 <0.03 ng/mL   Dg Chest 2 View  Result  Date: 10/31/2017 CLINICAL DATA:  Right-sided chest pain.  Shortness of breath. EXAM: CHEST  2 VIEW COMPARISON:  Most recent radiograph 09/13/2017. Most recent CT 09/13/2012 FINDINGS: The cardiomediastinal contours are normal. The lungs are clear. Pulmonary vasculature is normal. No consolidation, pleural effusion, or pneumothorax. No acute osseous abnormalities are seen. IMPRESSION: No acute pulmonary process. Electronically Signed   By: Rubye OaksMelanie  Ehinger M.D.   On: 10/31/2017 01:32

## 2017-10-31 NOTE — ED Notes (Signed)
Pt laying on long bench seat in WR; resting with eyes closed, resp even and unlabored

## 2017-10-31 NOTE — ED Provider Notes (Signed)
[facility] Emergency Department Provider Note  ____________________________________________   First MD Initiated Contact with Patient 10/31/17 0309     (approximate)  I have reviewed the triage vital signs and the nursing notes.   HISTORY  Chief Complaint Chest Pain   HPI Kimberly Nolan is a 31 y.o. female is self presents to the emergency department with 3 days of atypical chest pain.  The pain is throbbing in her right upper chest.  It is nonradiating.  It is nonexertional.  No nausea.  Nothing seems to make it better or worse.  She came to the emergency department today because while the pain had previously been intermittent for the past 24 hours or so has been constant.  It is non-positional.  She has had a recent upper respiratory tract infection which is resolved.  She has a past medical history of Graves' disease although has been out of all of her medications recently.  She denies change in her weight.  She denies hemoptysis.  She denies history of DVT or pulmonary embolism.  She denies leg swelling.  She denies recent surgery or immobilization  09/14/12 echo: Impressions:  - Technically difficult study with poor acoustic windows. Normal LV size and systolic function, EF 55-60%. Normal diastolic function. Normal RV size and systolic function. No significant valvular abnormalities.  Past Medical History:  Diagnosis Date  . Essential hypertension   . Graves disease   . Morbid obesity (HCC)   . Smoker     Patient Active Problem List   Diagnosis Date Noted  . 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 Thyroiditis 02/17/2017  . Goiter 02/17/2017  . Hyperglycemia 02/17/2017  . Prediabetes 02/17/2017  . Morbid obesity (HCC) 02/17/2017  . Hyperthyroidism 02/16/2017  . Graves disease 02/16/2017  . Essential hypertension 02/16/2017  . Current every day smoker 02/27/2015  . Morbid obesity with BMI of 50.0-59.9, adult (HCC) 02/27/2015  . Shortness of  breath 09/13/2012  . CAP (community acquired pneumonia) 09/13/2012  . 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 Leukocytosis 09/13/2012  . Palpitations 09/13/2012    Past Surgical History:  Procedure Laterality Date  . CESAREAN SECTION      Prior to Admission medications   Medication Sig Start Date End Date Taking? Authorizing Provider  HYDROcodone-acetaminophen (NORCO/VICODIN) 5-325 MG tablet Take 1 tablet by mouth every 6 (six) hours as needed for moderate pain. 09/15/17   Sharyn CreamerQuale, Mark, MD  methimazole (TAPAZOLE) 10 MG tablet Take 2 tablets (20 mg total) by mouth 2 (two) times daily. 02/18/17   Renae FickleShort, Mackenzie, MD    Allergies Penicillins  Family History  Problem Relation Age of Onset  . Thyroid disease Mother   . Hypertension Father   . Stroke Father   . Diabetes Brother   . Hypertension Brother     Social History Social History   Tobacco Use  . Smoking status: Current Every Day Smoker    Packs/day: 0.10    Years: 3.00    Pack years: 0.30    Types: Cigarettes  . Smokeless tobacco: Never Used  Substance Use Topics  . Alcohol use: Yes    Comment: Occasionally  . Drug use: No    Review of Systems Constitutional: No fever/chills Eyes: No visual changes. ENT: No sore throat. Cardiovascular: Positive for chest pain. Respiratory: Positive for shortness of breath. Gastrointestinal: No abdominal pain.  No nausea, no vomiting.  No diarrhea.  No constipation. Genitourinary: Negative for dysuria. Musculoskeletal: Negative for back pain. Skin: Negative for rash.  Neurological: Negative for headaches, focal weakness or numbness.   ____________________________________________   PHYSICAL EXAM:  VITAL SIGNS: ED Triage Vitals  Enc Vitals Group     BP 10/31/17 0043 (!) 150/93     Pulse Rate 10/31/17 0043 100     Resp 10/31/17 0043 20     Temp 10/31/17 0043 98.4 F (36.9 C)     Temp Source 10/31/17 0043 Oral     SpO2 10/31/17 0043 96 %     Weight 10/31/17 0044 (!) 330 lb  (149.7 kg)     Height 10/31/17 0044 5\' 7"  (1.702 m)     Head Circumference --      Peak Flow --      Pain Score 10/31/17 0043 10     Pain Loc --      Pain Edu? --      Excl. in GC? --     Constitutional: Alert and oriented x4 pleasant cooperative speaks full clear sentences no diaphoresis Eyes: PERRL EOMI. Head: Atraumatic. Nose: No congestion/rhinnorhea. Mouth/Throat: No trismus Neck: No stridor.  Able lie completely flat Cardiovascular: Borderline tachycardic rate, regular rhythm. Grossly normal heart sounds.  Good peripheral circulation.  Quite tender over right anterior chest  Respiratory: Normal respiratory effort.  No retractions. Lungs CTAB and moving good air Gastrointestinal: Soft nontender Musculoskeletal: No lower extremity edema   Neurologic:  Normal speech and language. No gross focal neurologic deficits are appreciated. Skin:  Skin is warm, dry and intact. No rash noted. Psychiatric: Mood and affect are normal. Speech and behavior are normal.    ____________________________________________   DIFFERENTIAL includes but not limited to  Acute coronary syndrome, pulmonary embolism, aortic dissection, Boerhaave syndrome, costochondritis ____________________________________________   LABS (all labs ordered are listed, but only abnormal results are displayed)  Labs Reviewed  BASIC METABOLIC PANEL - Abnormal; Notable for the following components:      Result Value   Glucose, Bld 119 (*)    All other components within normal limits  CBC  TROPONIN I  POC URINE PREG, ED    Lab work reviewed by me with no acute ischemia __________________________________________  EKG  ED ECG REPORT I, Merrily Brittle, the attending physician, personally viewed and interpreted this ECG.  Date: 10/31/2017 EKG Time:  Rate: 95 Rhythm: normal sinus rhythm QRS Axis: normal Intervals: normal ST/T Wave abnormalities: normal Narrative Interpretation: no evidence of acute  ischemia  ____________________________________________  RADIOLOGY  Chest x-ray reviewed by me with no acute disease ____________________________________________   PROCEDURES  Procedure(s) performed: no  Procedures  Critical Care performed: no  Observation: no ____________________________________________   INITIAL IMPRESSION / ASSESSMENT AND PLAN / ED COURSE  Pertinent labs & imaging results that were available during my care of the patient were reviewed by me and considered in my medical decision making (see chart for details).  Patient arrives hemodynamically stable quite well-appearing.  Her EKG is nonischemic and her blood work is reassuring.  I do not believe she has acute coronary syndrome given the atypical nature of her pain and while her heart rate is 100 so she is not PERC negative, she is very low risk for pulmonary embolism and I do not believe she has a clot at this time.  Her pain is improved with ibuprofen.  This very well may be a costochondritis versus a post viral inflammation after her recent URI.  Regardless A will prescribe her 1 week of ibuprofen and instructed to follow-up with her primary care provider.  She  verbalized understanding and agree with plan.      ____________________________________________   FINAL CLINICAL IMPRESSION(S) / ED DIAGNOSES  Final diagnoses:  None      NEW MEDICATIONS STARTED DURING THIS VISIT:  This SmartLink is deprecated. Use AVSMEDLIST instead to display the medication list for a patient.   Note:  This document was prepared using Dragon voice recognition software and may include unintentional dictation errors.     Merrily Brittleifenbark, Hanae Waiters, MD 10/31/17 [phone]

## 2017-10-31 NOTE — ED Triage Notes (Signed)
Pt states right sided chest pain for several days, throbbing in nature. Pt states she has been nauseated and shob, but pt states she is always shob. Pt states has had pain radiating to right neck.

## 2017-11-21 ENCOUNTER — Emergency Department
Admission: EM | Admit: 2017-11-21 | Discharge: 2017-11-21 | Disposition: A | Discharge status: Home / Self Care | Payer: Medicaid Other | Attending: Emergency Medicine | Admitting: Emergency Medicine

## 2017-11-21 ENCOUNTER — Other Ambulatory Visit: Payer: Self-pay

## 2017-11-21 DIAGNOSIS — Z79899 Other long term (current) drug therapy: Secondary | ICD-10-CM | POA: Diagnosis not present

## 2017-11-21 DIAGNOSIS — F17210 Nicotine dependence, cigarettes, uncomplicated: Secondary | ICD-10-CM | POA: Diagnosis not present

## 2017-11-21 DIAGNOSIS — K029 Dental caries, unspecified: Secondary | ICD-10-CM | POA: Insufficient documentation

## 2017-11-21 DIAGNOSIS — I10 Essential (primary) hypertension: Secondary | ICD-10-CM | POA: Diagnosis not present

## 2017-11-21 DIAGNOSIS — K047 Periapical abscess without sinus: Secondary | ICD-10-CM | POA: Insufficient documentation

## 2017-11-21 DIAGNOSIS — K0889 Other specified disorders of teeth and supporting structures: Secondary | ICD-10-CM | POA: Diagnosis present

## 2017-11-21 MED ORDER — MAGIC MOUTHWASH W/LIDOCAINE: 5.0000 mL | Freq: Four times a day (QID) | ORAL | 0 refills | Status: DC

## 2017-11-21 MED ORDER — CLINDAMYCIN HCL 150 MG PO CAPS
300.0000 mg | ORAL_CAPSULE | Freq: Once | ORAL | Status: AC
  Administered 2017-11-21: 300 mg via ORAL
  Filled 2017-11-21: qty 2

## 2017-11-21 MED ORDER — CLINDAMYCIN HCL 300 MG PO CAPS: 300.0000 mg | ORAL_CAPSULE | Freq: Four times a day (QID) | ORAL | 0 refills | Status: DC

## 2017-11-21 MED ORDER — OXYCODONE-ACETAMINOPHEN 5-325 MG PO TABS
1.0000 | ORAL_TABLET | Freq: Once | ORAL | Status: AC
  Administered 2017-11-21: 1 via ORAL
  Filled 2017-11-21: qty 1

## 2017-11-21 MED ORDER — HYDROCODONE-ACETAMINOPHEN 5-325 MG PO TABS: 1.0000 | ORAL_TABLET | ORAL | 0 refills | Status: DC | PRN

## 2017-11-21 NOTE — ED Triage Notes (Signed)
Pt states R upper dental pain for a while. States tooth is broke. R side of face appears swollen. Denies seeing a dentist. States pain to R side of face.

## 2017-11-21 NOTE — Discharge Instructions (Signed)
OPTIONS FOR DENTAL FOLLOW UP CARE ° °[place] Department of Health and Human Services - Local Safety Net Dental Clinics °http://www.ncdhhs.gov/dph/oralhealth/services/safetynetclinics.htm °  °[facility] (336-562-3123) ° °[facility] (919-933-9087) ° °[facility] ([phone] ext [phone]) ° °[facility] ([phone]) ° °[facility] ([phone]) °This clinic caters to the indigent population and is on a lottery system. °Location: °[facility], [address], [place] °Clinic Hours: °Wednesdays from 6pm - 9pm, patients seen by a lottery system. °For dates, call or go to www.med.[facility].edu/[facility]/patients/Dental-[facility] °Services: °Cleanings, fillings and simple extractions. °Payment Options: °DENTAL WORK IS FREE OF CHARGE. Bring proof of income or support. °Best way to get seen: °Arrive at 5:15 pm - this is a lottery, NOT first come/first serve, so arriving earlier will not increase your chances of being seen. °  °  °[facility] °[phone] °Select option 1 for emergencies °  °Location: °[facility], [address], [place] °Clinic Hours: °No walk-ins accepted - call the day before to schedule an appointment. °Check in times are 9:30 am and 1:30 pm. °Services: °Simple extractions, temporary fillings, pulpectomy/pulp debridement, uncomplicated abscess drainage. °Payment Options: °PAYMENT IS DUE AT THE TIME OF SERVICE.  Fee is usually $100-200, additional surgical procedures (e.g. abscess drainage) may be extra. °Cash, checks, Visa/MasterCard accepted.  Can file Medicaid if patient is covered for dental - patient should call case worker to check. °No discount for [facility] patients. °Best way to get seen: °MUST call the day before and get onto the schedule. Can usually be seen the next 1-2 days. No walk-ins accepted. °  °  °[facility] °[phone] °   °Location: °[facility], [address], [place] °Clinic Hours: °M, W, Th, F 8am or 1:30pm, Tues 9a or 1:30 - first come/first served. °Services: °Simple extractions, temporary fillings, uncomplicated abscess drainage.  You do not need to be an [place] resident. °Payment Options: °PAYMENT IS DUE AT THE TIME OF SERVICE. °Dental insurance, otherwise sliding scale - bring proof of income or support. °Depending on income and treatment needed, cost is usually $50-200. °Best way to get seen: °Arrive early as it is first come/first served. °  °  °[facility] °[phone] °  °Location: °[address] °Clinic Hours: °Mon-Thu 8a-5p °Services: °Most basic dental services including extractions and fillings. °Payment Options: °PAYMENT IS DUE AT THE TIME OF SERVICE. °Sliding scale, up to 50% off - bring proof if income or support. °Medicaid with dental option accepted. °Best way to get seen: °Call to schedule an appointment, can usually be seen within 2 weeks OR they will try to see walk-ins - show up at 8a or 2p (you may have to wait). °  °  °[facility] °[phone] °[place] RESIDENTS ONLY °  °Location: °[facility], [address], [place],  [address] °Clinic Hours: By appointment only. °Monday - Thursday 8am-5pm, Friday 8am-12pm °Services: Cleanings, fillings, extractions. °Payment Options: °PAYMENT IS DUE AT THE TIME OF SERVICE. °Cash, Visa or MasterCard. Sliding scale - $30 minimum per service. °Best way to get seen: °Come in to office, complete packet and make an appointment - need proof of income °or support monies for each household member and proof of [place] residence. °Usually takes about a month to get in. °  °  °[facility] °[phone] °  °Location: °[address]   [place] °Clinic Hours: Walk-in Urgent Care Dental Services are offered Monday-Friday  mornings only. °The numbers of emergencies accepted daily is limited to the number of °providers available. °Maximum 15 - Mondays, Wednesdays & Thursdays °Maximum 10 - Tuesdays & Fridays °Services: °You do not need to be a [place] resident to be seen for a dental emergency. °Emergencies are defined as pain, swelling, abnormal bleeding, or dental trauma. Walkins will receive x-rays if needed. °NOTE: Dental cleaning is not an emergency. °Payment Options: °PAYMENT IS DUE AT THE TIME OF SERVICE. °Minimum co-pay is $40.00 for uninsured patients. °Minimum co-pay is $3.00 for Medicaid with dental coverage. °Dental Insurance is accepted and must be presented at time of visit. °Medicare does not cover dental. °Forms of payment: Cash, credit card, checks. °Best way to get seen: °If not previously registered with the clinic, walk-in dental registration begins at 7:15 am and is on a first come/first serve basis. °If previously registered with the clinic, call to make an appointment. °  °  °[facility] °[phone] °[place] RESIDENTS ONLY °  °Location: °[address], [place], [place] °Clinic Hours: °Mon-Thu 10a-2p °Services: Extractions only! °Payment Options: °FREE (donations accepted) - bring proof of income or support °Best way to get seen: °Call and schedule an appointment OR come at 8am on the 1st Monday of every month (except for holidays) when it is first come/first served. °  °  °[facility] °[phone] °  °Location: °[address], [place] °Clinic Hours: °Friday mornings °Services, Payment Options, Best way to get seen: °Call for info °

## 2017-11-21 NOTE — ED Notes (Addendum)
Pt's spouse is at nursing station stating that his wife is in pain and asking if we can get her something. We explained that we couldn't prescribe medication but that the provider would be there as soon as he was able. Nurse provided pt with ice pack to help with pain.

## 2017-11-21 NOTE — ED Provider Notes (Signed)
[facility] Emergency Department Provider Note  ____________________________________________  Time seen: Approximately 9:11 PM  I have reviewed the triage vital signs and the nursing notes.   HISTORY  Chief Complaint Dental Pain    HPI Kimberly Nolan is a 32 y.o. female who presents the emergency department complaining of right upper arm dental pain.  Patient reports that she has a broken wisdom tooth, infected molar to the right upper dentition.  Patient has been dealing with this for the past several months.  Patient reports that she did see a dentist in [place] several months prior, but after infection was treated, the dentist did not want to extract the tooth.  Patient has switched dentist and has an appointment in 4 days.  Patient denies any difficulty breathing or swallowing, fevers or chills, abdominal pain, nausea vomiting.  Patient reports that pain is severe.  She has tried Tylenol, Motrin, Orajel with no relief.  Patient denies any direct trauma to the tooth.  She denies any drainage from the site.  No other complaints at this time.  Past Medical History:  Diagnosis Date  . Essential hypertension   . Graves disease   . Morbid obesity (HCC)   . Smoker     Patient Active Problem List   Diagnosis Date Noted  . 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 Thyroiditis 02/17/2017  . Goiter 02/17/2017  . Hyperglycemia 02/17/2017  . Prediabetes 02/17/2017  . Morbid obesity (HCC) 02/17/2017  . Hyperthyroidism 02/16/2017  . Graves disease 02/16/2017  . Essential hypertension 02/16/2017  . Current every day smoker 02/27/2015  . Morbid obesity with BMI of 50.0-59.9, adult (HCC) 02/27/2015  . Shortness of breath 09/13/2012  . CAP (community acquired pneumonia) 09/13/2012  . 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 Leukocytosis 09/13/2012  . Palpitations 09/13/2012    Past Surgical History:  Procedure Laterality Date  . CESAREAN SECTION      Prior to Admission  medications   Medication Sig Start Date End Date Taking? Authorizing Provider  clindamycin (CLEOCIN) 300 MG capsule Take 1 capsule (300 mg total) by mouth 4 (four) times daily. 11/21/17   Cuthriell, Delorise Royals, PA-C  HYDROcodone-acetaminophen (NORCO/VICODIN) 5-325 MG tablet Take 1 tablet by mouth every 6 (six) hours as needed for moderate pain. 09/15/17   Sharyn Creamer, MD  HYDROcodone-acetaminophen (NORCO/VICODIN) 5-325 MG tablet Take 1 tablet by mouth every 4 (four) hours as needed for moderate pain. 11/21/17   Cuthriell, Delorise Royals, PA-C  ibuprofen (ADVIL,MOTRIN) 600 MG tablet Take 1 tablet (600 mg total) by mouth every 8 (eight) hours as needed. 10/31/17   Merrily Brittle, MD  magic mouthwash w/lidocaine SOLN Take 5 mLs by mouth 4 (four) times daily. 11/21/17   Cuthriell, Delorise Royals, PA-C  methimazole (TAPAZOLE) 10 MG tablet Take 2 tablets (20 mg total) by mouth 2 (two) times daily. 02/18/17   Renae Fickle, MD    Allergies Penicillins  Family History  Problem Relation Age of Onset  . Thyroid disease Mother   . Hypertension Father   . Stroke Father   . Diabetes Brother   . Hypertension Brother     Social History Social History   Tobacco Use  . Smoking status: Current Every Day Smoker    Packs/day: 0.10    Years: 3.00    Pack years: 0.30    Types: Cigarettes  . Smokeless tobacco: Never Used  Substance Use Topics  . Alcohol use: Yes    Comment: Occasionally  . Drug use: No  Review of Systems  Constitutional: No fever/chills Eyes: No visual changes. No discharge ENT: Positive for right upper dental pain and infection Cardiovascular: no chest pain. Respiratory: no cough. No SOB. Gastrointestinal: No abdominal pain.  No nausea, no vomiting.  No diarrhea.  No constipation. Musculoskeletal: Negative for musculoskeletal pain. Skin: Negative for rash, abrasions, lacerations, ecchymosis. Neurological: Negative for headaches, focal weakness or numbness. 10-point ROS otherwise  negative.  ____________________________________________   PHYSICAL EXAM:  VITAL SIGNS: ED Triage Vitals  Enc Vitals Group     BP 11/21/17 1920 (!) 155/86     Pulse Rate 11/21/17 1919 81     Resp 11/21/17 1919 18     Temp 11/21/17 1919 98.2 F (36.8 C)     Temp Source 11/21/17 1919 Oral     SpO2 11/21/17 1919 97 %     Weight 11/21/17 1919 (!) 335 lb (152 kg)     Height 11/21/17 1919 5\' 7"  (1.702 m)     Head Circumference --      Peak Flow --      Pain Score 11/21/17 1919 10     Pain Loc --      Pain Edu? --      Excl. in GC? --      Constitutional: Alert and oriented. Well appearing and in no acute distress. Eyes: Conjunctivae are normal. PERRL. EOMI. Head: Atraumatic. ENT:      Ears:       Nose: No congestion/rhinnorhea.      Mouth/Throat: Mucous membranes are moist.  Patient does have a fractured wisdom tooth right upper dentition with dental erosion noted on the first molar right upper dentition.  Mild surrounding erythema and edema with no fluctuance or induration with palpation tongue depressor.  No drainage of pustular material.  No evidence of deep space infection or Ludwig angina.  Uvula is midline. Neck: No stridor.  Neck is supple full range of motion Hematological/Lymphatic/Immunilogical: No cervical lymphadenopathy. Cardiovascular: Normal rate, regular rhythm. Normal S1 and S2.  Good peripheral circulation. Respiratory: Normal respiratory effort without tachypnea or retractions. Lungs CTAB. Good air entry to the bases with no decreased or absent breath sounds. Musculoskeletal: Full range of motion to all extremities. No gross deformities appreciated. Neurologic:  Normal speech and language. No gross focal neurologic deficits are appreciated.  Skin:  Skin is warm, dry and intact. No rash noted. Psychiatric: Mood and affect are normal. Speech and behavior are normal. Patient exhibits appropriate insight and  judgement.   ____________________________________________   LABS (all labs ordered are listed, but only abnormal results are displayed)  Labs Reviewed - No data to display ____________________________________________  EKG   ____________________________________________  RADIOLOGY   No results found.  ____________________________________________    PROCEDURES  Procedure(s) performed:    Procedures    Medications  clindamycin (CLEOCIN) capsule 300 mg (not administered)  oxyCODONE-acetaminophen (PERCOCET/ROXICET) 5-325 MG per tablet 1 tablet (not administered)     ____________________________________________   INITIAL IMPRESSION / ASSESSMENT AND PLAN / ED COURSE  Pertinent labs & imaging results that were available during my care of the patient were reviewed by me and considered in my medical decision making (see chart for details).  Review of the [place] CSRS was performed in accordance of the [facility] prior to dispensing any controlled drugs.     Patient's diagnosis is consistent with dental infection and dental pain.  Patient presents with ongoing issues with same tooth.  Patient had seen a dentist who refused to extract dentition.  Patient has a repeat infection to the area.  This time, no concern for deep underlying infection or Ludwig's angina.  Patient is given first dose of pain medication and antibiotic in the emergency department.  She has an appointment with her dentist in 4 days for further and more definitive care.  She will follow-up at that time.. Patient will be discharged home with prescriptions for clindamycin, limited pain medication, Magic mouthwash. Patient is to follow up with dentist as needed or otherwise directed. Patient is given ED precautions to return to the ED for any worsening or new symptoms.     ____________________________________________  FINAL CLINICAL IMPRESSION(S) / ED DIAGNOSES  Final diagnoses:  Dental infection  Dental caries       NEW MEDICATIONS STARTED DURING THIS VISIT:  ED Discharge Orders        Ordered    clindamycin (CLEOCIN) 300 MG capsule  4 times daily     11/21/17 2142    HYDROcodone-acetaminophen (NORCO/VICODIN) 5-325 MG tablet  Every 4 hours PRN     11/21/17 2142    magic mouthwash w/lidocaine SOLN  4 times daily    Comments:  Dispense in a 1/1/1/1 ratio. Use lidocaine, diphenhydramine, prednisolone, nystatin   11/21/17 2142          This chart was dictated using voice recognition software/Dragon. Despite best efforts to proofread, errors can occur which can change the meaning. Any change was purely unintentional.    Racheal PatchesCuthriell, Jonathan D, PA-C 11/21/17 2143    Jeanmarie PlantMcShane, James A, MD 11/21/17 2340

## 2017-11-21 NOTE — ED Notes (Signed)
Pt has two or three teeth cracked in her right upper jaw. Pt has had this tooth pain for the last year and a half. Pt's spouse is speaking for pt stating [facility]. Pt has also been seen in [place] for the teeth. Pt has a dental appointment on Thursday at 0945. Pt has not had any fevers or facial swelling

## 2017-11-21 NOTE — ED Notes (Signed)
Pt approaching nurse. Pt stating "can I get some pain medication or somethin'." Nurse explained that she was unable to prescribe medications but that she would let the provider know to see her soon. Pt stating, "tell them to hurry the hell up."

## 2017-11-21 NOTE — ED Notes (Signed)
Patient called x2. No answer.  

## 2017-11-21 NOTE — ED Notes (Signed)
Hospital police called because of aggressive approach of pt's spouse to provider. Pt's spouse was yelling on [place], [place] and following him in the corridor. Hospital police have arrived speaking with pt and pt's family at this time.

## 2017-12-03 ENCOUNTER — Other Ambulatory Visit: Payer: Self-pay

## 2017-12-03 ENCOUNTER — Emergency Department
Admission: EM | Admit: 2017-12-03 | Discharge: 2017-12-03 | Disposition: A | Discharge status: Home / Self Care | Payer: Medicaid Other | Attending: Emergency Medicine | Admitting: Emergency Medicine

## 2017-12-03 ENCOUNTER — Encounter: Payer: Self-pay | Admitting: Emergency Medicine

## 2017-12-03 DIAGNOSIS — J029 Acute pharyngitis, unspecified: Secondary | ICD-10-CM | POA: Insufficient documentation

## 2017-12-03 DIAGNOSIS — I10 Essential (primary) hypertension: Secondary | ICD-10-CM | POA: Insufficient documentation

## 2017-12-03 DIAGNOSIS — B349 Viral infection, unspecified: Secondary | ICD-10-CM | POA: Insufficient documentation

## 2017-12-03 DIAGNOSIS — F17210 Nicotine dependence, cigarettes, uncomplicated: Secondary | ICD-10-CM | POA: Insufficient documentation

## 2017-12-03 DIAGNOSIS — Z79899 Other long term (current) drug therapy: Secondary | ICD-10-CM | POA: Insufficient documentation

## 2017-12-03 DIAGNOSIS — R0602 Shortness of breath: Secondary | ICD-10-CM | POA: Diagnosis present

## 2017-12-03 LAB — GROUP A STREP BY PCR: GROUP A STREP BY PCR: NOT DETECTED

## 2017-12-03 MED ORDER — PSEUDOEPH-BROMPHEN-DM 30-2-10 MG/5ML PO SYRP: 5.0000 mL | ORAL_SOLUTION | Freq: Four times a day (QID) | ORAL | 0 refills | Status: DC | PRN

## 2017-12-03 NOTE — ED Triage Notes (Signed)
Sore throat x 2 days, denies fever. Sinus drainage, greenish.

## 2017-12-03 NOTE — ED Provider Notes (Signed)
[facility] Emergency Department Provider Note   ____________________________________________   First MD Initiated Contact with Patient 12/03/17 1055     (approximate)  I have reviewed the triage vital signs and the nursing notes.   HISTORY  Chief Complaint Sore Throat   HPI Kimberly Nolan is a 32 y.o. female is here with complaint of sore throat for 2 days.  Patient is unaware of any fever.  She states that she has had sinus drainage.  She has been taking minimal over-the-counter medication without any relief.  She also states that her ears hurt bilaterally.  She rates her pain as a 10/10.   Past Medical History:  Diagnosis Date  . Essential hypertension   . Graves disease   . Morbid obesity (HCC)   . Smoker     Patient Active Problem List   Diagnosis Date Noted  . 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 Thyroiditis 02/17/2017  . Goiter 02/17/2017  . Hyperglycemia 02/17/2017  . Prediabetes 02/17/2017  . Morbid obesity (HCC) 02/17/2017  . Hyperthyroidism 02/16/2017  . Graves disease 02/16/2017  . Essential hypertension 02/16/2017  . Current every day smoker 02/27/2015  . Morbid obesity with BMI of 50.0-59.9, adult (HCC) 02/27/2015  . Shortness of breath 09/13/2012  . CAP (community acquired pneumonia) 09/13/2012  . 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 Leukocytosis 09/13/2012  . Palpitations 09/13/2012    Past Surgical History:  Procedure Laterality Date  . CESAREAN SECTION      Prior to Admission medications   Medication Sig Start Date End Date Taking? Authorizing Provider  brompheniramine-pseudoephedrine-DM 30-2-10 MG/5ML syrup Take 5 mLs by mouth 4 (four) times daily as needed. 12/03/17   Tommi Rumps, PA-C  methimazole (TAPAZOLE) 10 MG tablet Take 2 tablets (20 mg total) by mouth 2 (two) times daily. 02/18/17   Renae Fickle, MD    Allergies Penicillins  Family History  Problem Relation Age of Onset  . Thyroid disease Mother   .  Hypertension Father   . Stroke Father   . Diabetes Brother   . Hypertension Brother     Social History Social History   Tobacco Use  . Smoking status: Current Every Day Smoker    Packs/day: 0.50    Years: 3.00    Pack years: 1.50    Types: Cigarettes  . Smokeless tobacco: Never Used  Substance Use Topics  . Alcohol use: Yes    Comment: Occasionally  . Drug use: No    Review of Systems Constitutional: No fever/chills Eyes: No visual changes. ENT: Positive sore throat. Cardiovascular: Denies chest pain. Respiratory: Denies shortness of breath.  Positive cough. Gastrointestinal: No abdominal pain.  No nausea, no vomiting.  Musculoskeletal: Negative for back pain. Skin: Negative for rash. Neurological: Negative for headaches ____________________________________________   PHYSICAL EXAM:  VITAL SIGNS: ED Triage Vitals [12/03/17 1047]  Enc Vitals Group     BP (!) 146/79     Pulse Rate 92     Resp 18     Temp 98.4 F (36.9 C)     Temp Source Oral     SpO2 99 %     Weight (!) 330 lb (149.7 kg)     Height 5\' 7"  (1.702 m)     Head Circumference      Peak Flow      Pain Score 10     Pain Loc      Pain Edu?      Excl. in GC?  Constitutional: Alert and oriented. Well appearing and in no acute distress. Eyes: Conjunctivae are normal.  Head: Atraumatic. Nose: No congestion/rhinnorhea. Mouth/Throat: Mucous membranes are moist.  Oropharynx with moderate amount of posterior drainage was noted but no exudate or erythema. Neck: No stridor.   Hematological/Lymphatic/Immunilogical: No cervical lymphadenopathy. Cardiovascular: Normal rate, regular rhythm. Grossly normal heart sounds.  Good peripheral circulation. Respiratory: Normal respiratory effort.  No retractions. Lungs CTAB. Gastrointestinal: Soft and nontender. No distention.  Musculoskeletal: Moves upper and lower extremities without any difficulty.  Normal gait was noted. Neurologic:  Normal speech and language.  No gross focal neurologic deficits are appreciated.  Skin:  Skin is warm, dry and intact.  Psychiatric: Mood and affect are normal. Speech and behavior are normal.  ____________________________________________   LABS (all labs ordered are listed, but only abnormal results are displayed)  Labs Reviewed  GROUP A STREP BY PCR   ____________________________________________   PROCEDURES  Procedure(s) performed: None  Procedures  Critical Care performed: No  ____________________________________________   INITIAL IMPRESSION / ASSESSMENT AND PLAN / ED COURSE Patient was given a prescription for Bromfed-DM as needed for cough and congestion.  She is to continue taking Tylenol or ibuprofen as needed for body aches.  She was also encouraged to use saline nasal spray as needed for nasal congestion follow-up with her PCP if any continued problems. ____________________________________________   FINAL CLINICAL IMPRESSION(S) / ED DIAGNOSES  Final diagnoses:  Viral pharyngitis     ED Discharge Orders        Ordered    brompheniramine-pseudoephedrine-DM 30-2-10 MG/5ML syrup  4 times daily PRN     12/03/17 1148       Note:  This document was prepared using Dragon voice recognition software and may include unintentional dictation errors.    Tommi RumpsSummers, Keagan Brislin L, PA-C 12/03/17 1200    Governor RooksLord, Rebecca, MD 12/03/17 [phone]

## 2017-12-03 NOTE — Discharge Instructions (Signed)
Follow-up with your primary care provider if any continued problems.  Increase fluids.  Bromfed-DM as needed for cough or congestion.  Continue with Tylenol or ibuprofen if needed for body aches.  You may also use saline nasal spray as needed for nasal congestion.

## 2017-12-04 ENCOUNTER — Encounter: Payer: Self-pay | Admitting: Emergency Medicine

## 2017-12-04 ENCOUNTER — Emergency Department
Admission: EM | Admit: 2017-12-04 | Discharge: 2017-12-04 | Disposition: A | Discharge status: Home / Self Care | Payer: Medicaid Other | Attending: Emergency Medicine | Admitting: Emergency Medicine

## 2017-12-04 ENCOUNTER — Other Ambulatory Visit: Payer: Self-pay

## 2017-12-04 DIAGNOSIS — R0989 Other specified symptoms and signs involving the circulatory and respiratory systems: Secondary | ICD-10-CM | POA: Diagnosis not present

## 2017-12-04 DIAGNOSIS — Z79899 Other long term (current) drug therapy: Secondary | ICD-10-CM | POA: Insufficient documentation

## 2017-12-04 DIAGNOSIS — G4733 Obstructive sleep apnea (adult) (pediatric): Secondary | ICD-10-CM | POA: Diagnosis not present

## 2017-12-04 DIAGNOSIS — H6592 Unspecified nonsuppurative otitis media, left ear: Secondary | ICD-10-CM

## 2017-12-04 DIAGNOSIS — I10 Essential (primary) hypertension: Secondary | ICD-10-CM | POA: Insufficient documentation

## 2017-12-04 DIAGNOSIS — H9202 Otalgia, left ear: Secondary | ICD-10-CM

## 2017-12-04 DIAGNOSIS — R0683 Snoring: Secondary | ICD-10-CM | POA: Diagnosis not present

## 2017-12-04 DIAGNOSIS — F17210 Nicotine dependence, cigarettes, uncomplicated: Secondary | ICD-10-CM | POA: Diagnosis not present

## 2017-12-04 DIAGNOSIS — R05 Cough: Secondary | ICD-10-CM | POA: Diagnosis not present

## 2017-12-04 MED ORDER — TRAMADOL HCL 50 MG PO TABS: 50.0000 mg | ORAL_TABLET | Freq: Four times a day (QID) | ORAL | 0 refills | Status: DC | PRN

## 2017-12-04 MED ORDER — FLUTICASONE PROPIONATE 50 MCG/ACT NA SUSP: 1.0000 | Freq: Every day | NASAL | 0 refills | Status: DC

## 2017-12-04 MED ORDER — PSEUDOEPHEDRINE HCL 30 MG PO TABS: 30.0000 mg | ORAL_TABLET | Freq: Four times a day (QID) | ORAL | 0 refills | Status: AC | PRN

## 2017-12-04 MED ORDER — KETOROLAC TROMETHAMINE 60 MG/2ML IM SOLN
60.0000 mg | Freq: Once | INTRAMUSCULAR | Status: AC
  Administered 2017-12-04: 60 mg via INTRAMUSCULAR
  Filled 2017-12-04: qty 2

## 2017-12-04 NOTE — ED Triage Notes (Signed)
Pt arrives ambulatory with steady gait to triage with c/o left ear pain. Pt is in NAD.

## 2017-12-04 NOTE — ED Notes (Signed)
Pt was seen yesterday for cough. Pt reports that she was unable to afford her cough medicine due to Medicaid not covering this.

## 2017-12-04 NOTE — ED Provider Notes (Signed)
[facility] Emergency Department Provider Note   ____________________________________________   First MD Initiated Contact with Patient 12/04/17 0250     (approximate)  I have reviewed the triage vital signs and the nursing notes.   HISTORY  Chief Complaint Otalgia    HPI Kimberly Nolan is a 32 y.o. female comes into the hospital today with some left ear pain and some choking when she is breathing.  The patient states that her left ear is hurting badly.  It started a few days ago.  She reports that she has not taken anything for it because she is unsure what to take aside from some ibuprofen.  She denies any drainage but she came into get her ear evaluated.  The patient also states she has been having trouble going to sleep.  She reports that she keeps choking when she is falling asleep.  She denies coughing or having excessive mucus in her throat.  The patient's significant other states that she snores and sometimes stops breathing when she is asleep.  The patient states that she wakes up coughing and cannot catch her breath.  The patient states that her pain is a 10 out of 10 in intensity.  She is here today for evaluation.   Past Medical History:  Diagnosis Date  . Essential hypertension   . Graves disease   . Morbid obesity (HCC)   . Smoker     Patient Active Problem List   Diagnosis Date Noted  . 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 Thyroiditis 02/17/2017  . Goiter 02/17/2017  . Hyperglycemia 02/17/2017  . Prediabetes 02/17/2017  . Morbid obesity (HCC) 02/17/2017  . Hyperthyroidism 02/16/2017  . Graves disease 02/16/2017  . Essential hypertension 02/16/2017  . Current every day smoker 02/27/2015  . Morbid obesity with BMI of 50.0-59.9, adult (HCC) 02/27/2015  . Shortness of breath 09/13/2012  . CAP (community acquired pneumonia) 09/13/2012  . 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 Leukocytosis 09/13/2012  . Palpitations 09/13/2012    Past Surgical  History:  Procedure Laterality Date  . CESAREAN SECTION      Prior to Admission medications   Medication Sig Start Date End Date Taking? Authorizing Provider  brompheniramine-pseudoephedrine-DM 30-2-10 MG/5ML syrup Take 5 mLs by mouth 4 (four) times daily as needed. 12/03/17   Tommi Rumps, PA-C  fluticasone (FLONASE) 50 MCG/ACT nasal spray Place 1 spray into both nostrils daily for 10 days. 12/04/17 12/14/17  Rebecka Apley, MD  methimazole (TAPAZOLE) 10 MG tablet Take 2 tablets (20 mg total) by mouth 2 (two) times daily. 02/18/17   Renae Fickle, MD  pseudoephedrine (SUDAFED) 30 MG tablet Take 1 tablet (30 mg total) by mouth every 6 (six) hours as needed for congestion. 12/04/17 12/04/18  Rebecka Apley, MD  traMADol (ULTRAM) 50 MG tablet Take 1 tablet (50 mg total) by mouth every 6 (six) hours as needed. 12/04/17   Rebecka Apley, MD    Allergies Penicillins  Family History  Problem Relation Age of Onset  . Thyroid disease Mother   . Hypertension Father   . Stroke Father   . Diabetes Brother   . Hypertension Brother     Social History Social History   Tobacco Use  . Smoking status: Current Every Day Smoker    Packs/day: 0.50    Years: 3.00    Pack years: 1.50    Types: Cigarettes  . Smokeless tobacco: Never Used  Substance Use Topics  . Alcohol use: Yes  Comment: Occasionally  . Drug use: No    Review of Systems  Constitutional: No fever/chills Eyes: No visual changes. ENT: Ear pain. Cardiovascular: Denies chest pain. Respiratory: Choking and shortness of breath. Gastrointestinal: No abdominal pain.  No nausea, no vomiting.  No diarrhea.  No constipation. Genitourinary: Negative for dysuria. Musculoskeletal: Negative for back pain. Skin: Negative for rash. Neurological: Negative for headaches, focal weakness or numbness.   ____________________________________________   PHYSICAL EXAM:  VITAL SIGNS: ED Triage Vitals  Enc Vitals Group      BP 12/04/17 0212 (!) 146/74     Pulse Rate 12/04/17 0212 90     Resp 12/04/17 0212 18     Temp 12/04/17 0212 98.9 F (37.2 C)     Temp src --      SpO2 12/04/17 0212 98 %     Weight 12/04/17 0211 (!) 330 lb (149.7 kg)     Height 12/04/17 0211 5\' 7"  (1.702 m)     Head Circumference --      Peak Flow --      Pain Score 12/04/17 0210 10     Pain Loc --      Pain Edu? --      Excl. in GC? --     Constitutional: Alert and oriented. Well appearing and in no acute distress. Ears: TMs without erythema but with some effusion no bulging, bilaterally Eyes: Conjunctivae are normal. PERRL. EOMI. Head: Atraumatic. Nose: No congestion/rhinnorhea. Mouth/Throat: Mucous membranes are moist.  Oropharynx non-erythematous. Cardiovascular: Normal rate, regular rhythm. Grossly normal heart sounds.  Good peripheral circulation. Respiratory: Normal respiratory effort.  No retractions. Lungs CTAB. Gastrointestinal: Soft and nontender. No distention.  Positive bowel sounds Musculoskeletal: No lower extremity tenderness nor edema.   Neurologic:  Normal speech and language.  Skin:  Skin is warm, dry and intact.  Psychiatric: Mood and affect are normal.   ____________________________________________   LABS (all labs ordered are listed, but only abnormal results are displayed)  Labs Reviewed - No data to display ____________________________________________  EKG  none ____________________________________________  RADIOLOGY  No results found.  ____________________________________________   PROCEDURES  Procedure(s) performed: None  Procedures  Critical Care performed: No  ____________________________________________   INITIAL IMPRESSION / ASSESSMENT AND PLAN / ED COURSE  As part of my medical decision making, I reviewed the following data within the electronic MEDICAL RECORD NUMBER Notes from prior ED visits and [place] Controlled Substance Database   This is a 32 year old female who comes  into the hospital today with some left-sided ear pain and some choking when she sleeps.  My differential diagnosis includes otitis media, otitis externa  I looked in the patient's ears and it appears that she has some serous otitis media.  The patient was seen yesterday and prescribed medication but she reports that it was not covered with her insurance.  She reports that she is unable to afford it.  I will give the patient a shot of Toradol as well as some tramadol, fluticasone and Sudafed.  The choking it sounds like the patient may have some obstructive sleep apnea.  I informed her that she needs to follow-up with her doctor to obtain a sleep study and possible CPAP to help sleep.  The patient will be discharged home to follow-up.      ____________________________________________   FINAL CLINICAL IMPRESSION(S) / ED DIAGNOSES  Final diagnoses:  Left ear pain  Left otitis media with effusion  Obstructive sleep apnea     ED Discharge Orders  Ordered    pseudoephedrine (SUDAFED) 30 MG tablet  Every 6 hours PRN     12/04/17 0331    fluticasone (FLONASE) 50 MCG/ACT nasal spray  Daily     12/04/17 0331    traMADol (ULTRAM) 50 MG tablet  Every 6 hours PRN     12/04/17 0331       Note:  This document was prepared using Dragon voice recognition software and may include unintentional dictation errors.    Rebecka Apley, MD 12/04/17 [phone]

## 2017-12-04 NOTE — Discharge Instructions (Signed)
Please follow up with your primary care physician for further evaluation °

## 2018-03-07 IMAGING — DX DG CHEST 1V
1 series · 1 of 1 positions shown · non-contrast
Comparison: 02/06/2014

CLINICAL DATA: Weakness swelling is and neck pain

EXAM:
CHEST 1 VIEW

[chest ap]
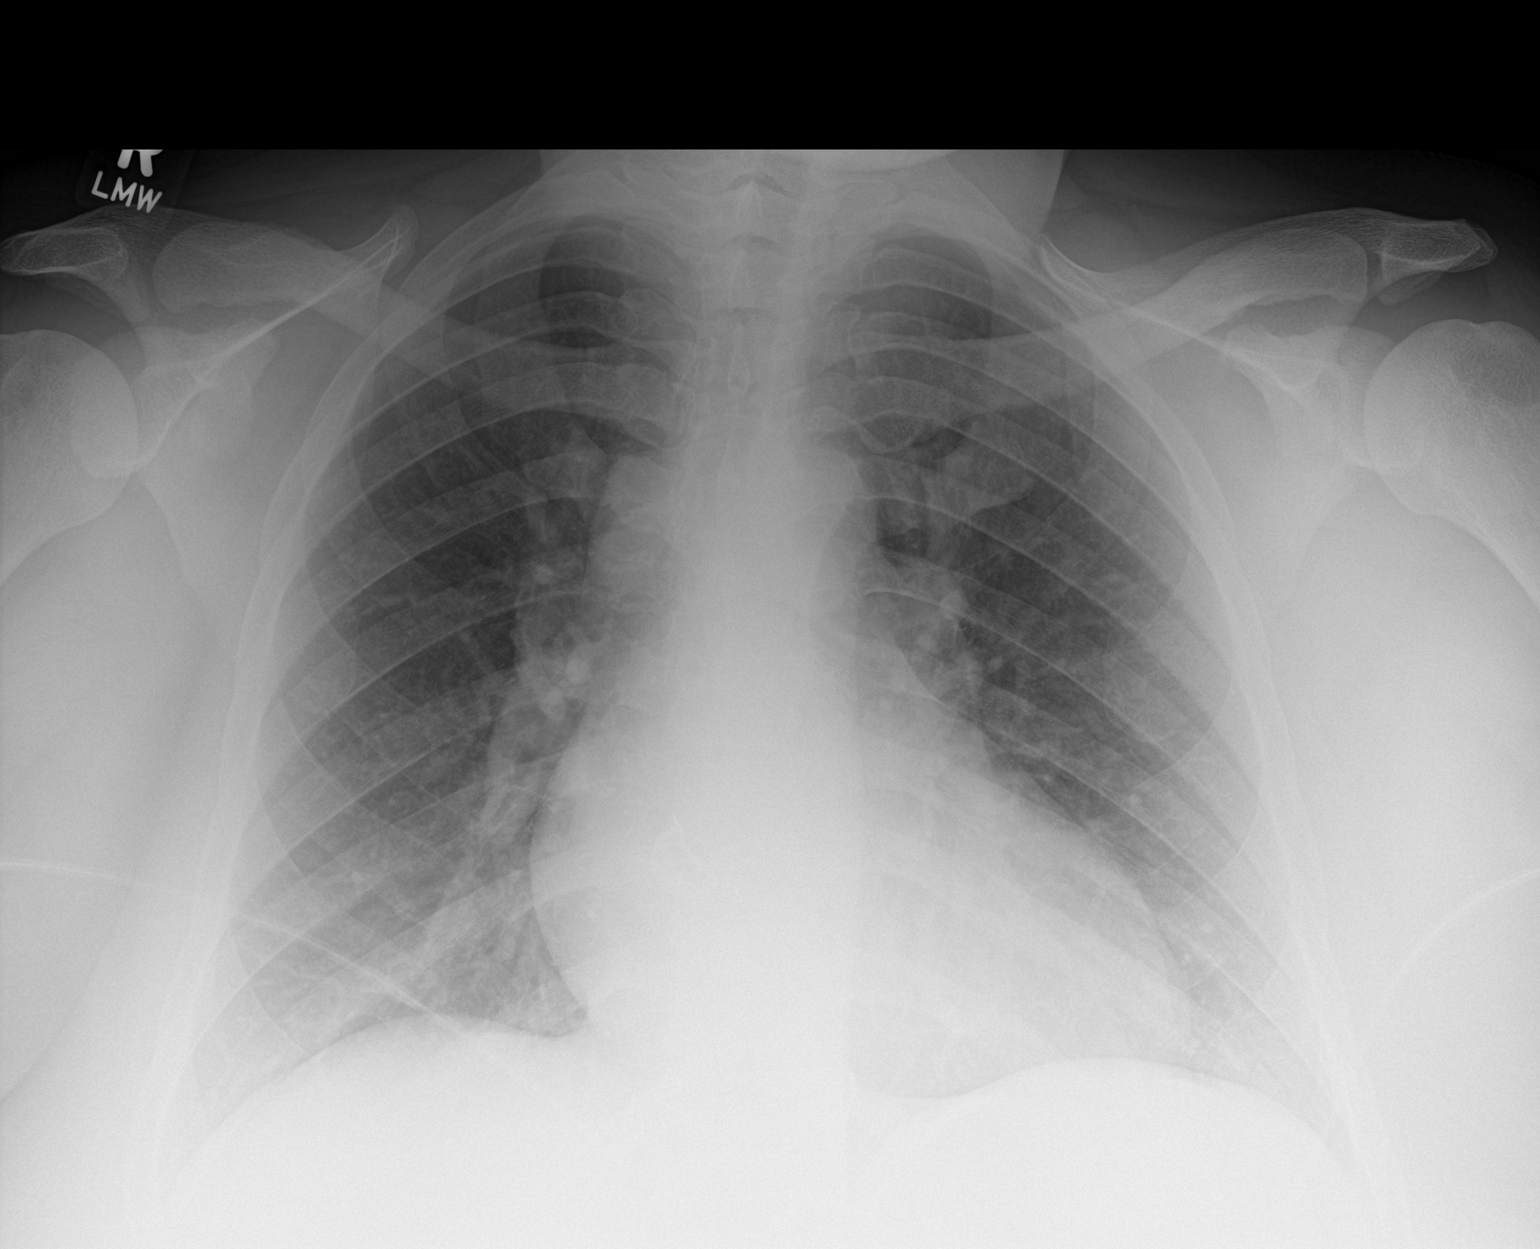

[1 of 1 positions shown; findings below may reference images not displayed]

FINDINGS: Borderline to mild cardiomegaly. No acute infiltrate or effusion. No
pneumothorax.
IMPRESSION: Borderline enlargement of the heart size. No acute infiltrate or
edema.

## 2018-03-27 ENCOUNTER — Emergency Department: Payer: Medicaid Other

## 2018-03-27 ENCOUNTER — Encounter: Payer: Self-pay | Admitting: Emergency Medicine

## 2018-03-27 ENCOUNTER — Emergency Department
Admission: EM | Admit: 2018-03-27 | Discharge: 2018-03-27 | Disposition: A | Discharge status: Home / Self Care | Payer: Medicaid Other | Attending: Emergency Medicine | Admitting: Emergency Medicine

## 2018-03-27 DIAGNOSIS — I10 Essential (primary) hypertension: Secondary | ICD-10-CM | POA: Diagnosis not present

## 2018-03-27 DIAGNOSIS — Z79899 Other long term (current) drug therapy: Secondary | ICD-10-CM | POA: Diagnosis not present

## 2018-03-27 DIAGNOSIS — M545 Low back pain: Secondary | ICD-10-CM | POA: Diagnosis present

## 2018-03-27 DIAGNOSIS — M5416 Radiculopathy, lumbar region: Secondary | ICD-10-CM | POA: Insufficient documentation

## 2018-03-27 DIAGNOSIS — F17210 Nicotine dependence, cigarettes, uncomplicated: Secondary | ICD-10-CM | POA: Diagnosis not present

## 2018-03-27 DIAGNOSIS — M5410 Radiculopathy, site unspecified: Secondary | ICD-10-CM

## 2018-03-27 MED ORDER — HYDROCODONE-ACETAMINOPHEN 5-325 MG PO TABS
2.0000 | ORAL_TABLET | Freq: Once | ORAL | Status: AC
  Administered 2018-03-27: 2 via ORAL
  Filled 2018-03-27: qty 2

## 2018-03-27 MED ORDER — IBUPROFEN 800 MG PO TABS
ORAL_TABLET | ORAL | Status: AC
  Filled 2018-03-27: qty 1

## 2018-03-27 MED ORDER — HYDROCODONE-ACETAMINOPHEN 5-325 MG PO TABS: 1.0000 | ORAL_TABLET | Freq: Four times a day (QID) | ORAL | 0 refills | Status: DC | PRN

## 2018-03-27 MED ORDER — IBUPROFEN 800 MG PO TABS
800.0000 mg | ORAL_TABLET | Freq: Once | ORAL | Status: AC
  Administered 2018-03-27: 800 mg via ORAL

## 2018-03-27 NOTE — ED Provider Notes (Signed)
[facility] Emergency Department Provider Note  ____________________________________________   First MD Initiated Contact with Patient 03/27/18 [phone]     (approximate)  I have reviewed the triage vital signs and the nursing notes.   HISTORY  Chief Complaint Hip Pain    HPI Kimberly Nolan is a 32 y.o. female who self presents to the emergency department with her husband with roughly 3 weeks of right low back pain radiating across her right hip towards the anterior aspect of her thigh and ending at her right knee.  No weakness.  She is able to ambulate.  No bowel or bladder incontinence or hesitance.  No history of IVDA.  No history of steroid use.  No trauma.  She has normal perianal sensation.  Her pain is worse when ambulating improved with rest.  Past Medical History:  Diagnosis Date  . Essential hypertension   . Graves disease   . Morbid obesity (HCC)   . Smoker     Patient Active Problem List   Diagnosis Date Noted  . 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 Thyroiditis 02/17/2017  . Goiter 02/17/2017  . Hyperglycemia 02/17/2017  . Prediabetes 02/17/2017  . Morbid obesity (HCC) 02/17/2017  . Hyperthyroidism 02/16/2017  . Graves disease 02/16/2017  . Essential hypertension 02/16/2017  . Current every day smoker 02/27/2015  . Morbid obesity with BMI of 50.0-59.9, adult (HCC) 02/27/2015  . Shortness of breath 09/13/2012  . CAP (community acquired pneumonia) 09/13/2012  . 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 Leukocytosis 09/13/2012  . Palpitations 09/13/2012    Past Surgical History:  Procedure Laterality Date  . CESAREAN SECTION      Prior to Admission medications   Medication Sig Start Date End Date Taking? Authorizing Provider  brompheniramine-pseudoephedrine-DM 30-2-10 MG/5ML syrup Take 5 mLs by mouth 4 (four) times daily as needed. 12/03/17   Tommi Rumps, PA-C  fluticasone (FLONASE) 50 MCG/ACT nasal spray Place 1 spray into both nostrils  daily for 10 days. 12/04/17 12/14/17  Rebecka Apley, MD  HYDROcodone-acetaminophen (NORCO) 5-325 MG tablet Take 1 tablet by mouth every 6 (six) hours as needed for up to 7 doses for severe pain. 03/27/18   Merrily Brittle, MD  methimazole (TAPAZOLE) 10 MG tablet Take 2 tablets (20 mg total) by mouth 2 (two) times daily. 02/18/17   Renae Fickle, MD  pseudoephedrine (SUDAFED) 30 MG tablet Take 1 tablet (30 mg total) by mouth every 6 (six) hours as needed for congestion. 12/04/17 12/04/18  Rebecka Apley, MD  traMADol (ULTRAM) 50 MG tablet Take 1 tablet (50 mg total) by mouth every 6 (six) hours as needed. 12/04/17   Rebecka Apley, MD    Allergies Penicillins  Family History  Problem Relation Age of Onset  . Thyroid disease Mother   . Hypertension Father   . Stroke Father   . Diabetes Brother   . Hypertension Brother     Social History Social History   Tobacco Use  . Smoking status: Current Every Day Smoker    Packs/day: 0.50    Years: 3.00    Pack years: 1.50    Types: Cigarettes  . Smokeless tobacco: Never Used  Substance Use Topics  . Alcohol use: Yes    Comment: Occasionally  . Drug use: No    Review of Systems Constitutional: No fever/chills Eyes: No visual changes. ENT: No sore throat. Cardiovascular: Denies chest pain. Respiratory: Denies shortness of breath. Gastrointestinal: No abdominal pain.  No nausea, no vomiting.  No diarrhea.  No constipation. Genitourinary: Negative for dysuria. Musculoskeletal: Positive for back pain. Skin: Negative for rash. Neurological: Negative for headaches, focal weakness or numbness.   ____________________________________________   PHYSICAL EXAM:  VITAL SIGNS: ED Triage Vitals  Enc Vitals Group     BP 03/27/18 0049 121/88     Pulse Rate 03/27/18 0049 91     Resp 03/27/18 0049 20     Temp 03/27/18 0049 98.6 F (37 C)     Temp Source 03/27/18 0049 Oral     SpO2 03/27/18 0049 97 %     Weight --      Height --       Head Circumference --      Peak Flow --      Pain Score 03/27/18 0050 9     Pain Loc --      Pain Edu? --      Excl. in GC? --     Constitutional: Alert and oriented x4 appears uncomfortable sitting up in bed and tearful Eyes: PERRL EOMI. Head: Atraumatic. Nose: No congestion/rhinnorhea. Mouth/Throat: No trismus Neck: No stridor.   Cardiovascular: Normal rate, regular rhythm. Grossly normal heart sounds.  Good peripheral circulation. Respiratory: Normal respiratory effort.  No retractions. Lungs CTAB and moving good air Gastrointestinal: Morbidly obese soft nontender Musculoskeletal: No lower extremity edema   No midline back tenderness but exquisitely tender right low back with spasm 5 out of 5 hip flexion hip extension plantar flexion dorsiflexion Neurologic:  Normal speech and language. No gross focal neurologic deficits are appreciated. Skin:  Skin is warm, dry and intact. No rash noted. Psychiatric: Mood and affect are normal. Speech and behavior are normal.    ____________________________________________   DIFFERENTIAL includes but not limited to  Radicular low back pain, musculoskeletal low back pain, epidural abscess, cauda equina syndrome ____________________________________________   LABS (all labs ordered are listed, but only abnormal results are displayed)  Labs Reviewed - No data to display   __________________________________________  EKG   ____________________________________________  RADIOLOGY  X-ray of the lumbar spine reviewed by me with no acute disease ____________________________________________   PROCEDURES  Procedure(s) performed: no  Procedures  Critical Care performed: no  Observation: no ____________________________________________   INITIAL IMPRESSION / ASSESSMENT AND PLAN / ED COURSE  Pertinent labs & imaging results that were available during my care of the patient were reviewed by me and considered in my medical  decision making (see chart for details).  Patient arrives with muscle spasm and right low back pain radiating down across her hip towards her knee consistent with radicular pain.  She has no history of imaging so x-ray obtained which fortunately is negative.  Given 2 Norco and her symptoms are improved.  She is able to ambulate.  Discharged home with a short course and primary care follow-up.  The patient verbalizes understanding and agreement with the plan.  Her husband is driving home.      ____________________________________________   FINAL CLINICAL IMPRESSION(S) / ED DIAGNOSES  Final diagnoses:  Radicular low back pain      NEW MEDICATIONS STARTED DURING THIS VISIT:  Discharge Medication List as of 03/27/2018  3:56 AM    START taking these medications   Details  HYDROcodone-acetaminophen (NORCO) 5-325 MG tablet Take 1 tablet by mouth every 6 (six) hours as needed for up to 7 doses for severe pain., Starting Mon 03/27/2018, Print         Note:  This document was prepared using Dragon  voice recognition software and may include unintentional dictation errors.     Merrily Brittle, MD 03/27/18 [phone]

## 2018-03-27 NOTE — ED Notes (Signed)
Pts husband enters triage 2 room and demands pt sit in seat. Husband informed that pt was standing by choice. Husband moves roller chair behind pt and pulls on pt to sit back, pt stumbles and this RN stops husband and informs the chair on wheels isnt safe. Husband kicks roller chair x3 up against wall, This RN sts, "I will not except this behavior, you can step out of room. I dont feel safe with you in here." Husband moves body forward towards this RN and sts, "This is my wife and I know what she needs. She is hurting and needs to sit down. You don't tell me to leave. All yall think you don't have to respect [place]. You going to respect [place] today." Husband asked to leave and wife goes with husband. Pt asked if she would accept a wheelchair, pt refuses. Husband in [place] sts, "she needs a wheelchair. You making her up and walk like this. I know what she needs. Baby, tell them you need a wheelchair." This RN gets wheelchair and wheels it behind pt asking her to have a seat for safety, pt complies and looks back at this RN and whispers, Sorry." Pts husband still cussing and raising voice. This RN ask for [employer] officer, [employer] and [employer] to come to [facility] due to this RN not tolerating the behavior exhibited infront of [facility] pts and to staff.

## 2018-03-27 NOTE — ED Notes (Signed)
Pt called for treatment room; she is currently in the bathroom; checked on pt who says she just needs a few more minutes; asked pt if I could put the wheelchair outside the bathroom for her but she declined;

## 2018-03-27 NOTE — Discharge Instructions (Signed)
It was a pleasure to take care of you today, and thank you for coming to our emergency department.  If you have any questions or concerns before leaving please ask the nurse to grab me and I'm more than happy to go through your aftercare instructions again.  If you were prescribed any opioid pain medication today such as Norco, Vicodin, Percocet, morphine, hydrocodone, or oxycodone please make sure you do not drive when you are taking this medication as it can alter your ability to drive safely.  If you have any concerns once you are home that you are not improving or are in fact getting worse before you can make it to your follow-up appointment, please do not hesitate to call 911 and come back for further evaluation.  Merrily Brittle, MD  Results for orders placed or performed during the hospital encounter of 12/03/17  Group A Strep by PCR ([facility] Only)  Result Value Ref Range   Group A Strep by PCR NOT DETECTED NOT DETECTED   Dg Lumbar Spine Complete  Result Date: 03/27/2018 CLINICAL DATA:  Acute onset of right hip pain radiating to the right leg. Lower back pain. EXAM: LUMBAR SPINE - COMPLETE 4+ VIEW COMPARISON:  Lumbar spine radiographs performed 03/30/2015 FINDINGS: There is no evidence of fracture or subluxation. Vertebral bodies demonstrate normal height and alignment. Intervertebral disc spaces are preserved. The visualized neural foramina are grossly unremarkable in appearance. The visualized bowel gas pattern is unremarkable in appearance; air and stool are noted within the colon. The sacroiliac joints are within normal limits. IMPRESSION: No evidence of fracture or subluxation along the lumbar spine. Electronically Signed   By: Roanna Raider M.D.   On: 03/27/2018 03:08

## 2018-03-27 NOTE — ED Triage Notes (Signed)
Pt c/o right hip pain that radiates into right leg. Pt reports symptoms x3 weeks, no injury reported. Pt ambulatory to triage. Pt asked if she would like a wheelchair, pt refused.

## 2018-03-27 NOTE — ED Notes (Signed)
[employer] and [employer] in [facility] to speak with pt and family.

## 2018-03-29 DIAGNOSIS — M5416 Radiculopathy, lumbar region: Secondary | ICD-10-CM | POA: Insufficient documentation

## 2018-03-29 DIAGNOSIS — M545 Low back pain, unspecified: Secondary | ICD-10-CM | POA: Insufficient documentation

## 2018-04-23 ENCOUNTER — Other Ambulatory Visit: Payer: Self-pay

## 2018-04-23 ENCOUNTER — Emergency Department
Admission: EM | Admit: 2018-04-23 | Discharge: 2018-04-23 | Disposition: A | Discharge status: Home / Self Care | Payer: Medicaid Other | Attending: Emergency Medicine | Admitting: Emergency Medicine

## 2018-04-23 DIAGNOSIS — Z79899 Other long term (current) drug therapy: Secondary | ICD-10-CM | POA: Insufficient documentation

## 2018-04-23 DIAGNOSIS — M545 Low back pain: Secondary | ICD-10-CM | POA: Insufficient documentation

## 2018-04-23 DIAGNOSIS — I10 Essential (primary) hypertension: Secondary | ICD-10-CM | POA: Insufficient documentation

## 2018-04-23 NOTE — ED Triage Notes (Signed)
First Nurse Note:  C/O low back pain that radiates down right leg.  States pain has been intermittent for "a while"

## 2018-04-23 NOTE — ED Notes (Addendum)
Pt ambulatory out of ED. Pt refused wanting paperwork or vitals. Pt walked out with significant other. Morrie SheldonAshley PA made aware that pt left.

## 2018-04-23 NOTE — ED Triage Notes (Signed)
Pt alert, oriented, ambulatory. States intermittent low back pain that radiates down R leg.   States hx of HTN but not on any medications at this time.

## 2018-04-23 NOTE — ED Provider Notes (Signed)
[facility] Emergency Department Provider Note  ____________________________________________  Time seen: Approximately 4:20 PM  I have reviewed the triage vital signs and the nursing notes.   HISTORY  Chief Complaint Back Pain    HPI Kimberly Nolan is a 32 y.o. female that presents to the emergency department for evaluation of low back pain radiating into right leg on and off for months.  Pain is primarily on the right side of her back and radiates into the side of right leg. Pain stops at her knee  Patient was seen in the emergency department 1 month ago for similar symptoms.  She states that pain at this time lasted about 1 week.  She followed up with [facility] and was told to begin physical therapy.  She was supposed to begin physical therapy but class was canceled and has not been rescheduled.  She has taken Tylenol and Aleve without relief. No fever, chills, weight loss, IV drug use, nausea, vomiting, abdominal pain, dysuria, urgency, frequency.  Past Medical History:  Diagnosis Date  . Essential hypertension   . Graves disease   . Morbid obesity (HCC)   . Smoker     Patient Active Problem List   Diagnosis Date Noted  . 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 Thyroiditis 02/17/2017  . Goiter 02/17/2017  . Hyperglycemia 02/17/2017  . Prediabetes 02/17/2017  . Morbid obesity (HCC) 02/17/2017  . Hyperthyroidism 02/16/2017  . Graves disease 02/16/2017  . Essential hypertension 02/16/2017  . Current every day smoker 02/27/2015  . Morbid obesity with BMI of 50.0-59.9, adult (HCC) 02/27/2015  . Shortness of breath 09/13/2012  . CAP (community acquired pneumonia) 09/13/2012  . 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 Leukocytosis 09/13/2012  . Palpitations 09/13/2012    Past Surgical History:  Procedure Laterality Date  . CESAREAN SECTION      Prior to Admission medications   Medication Sig Start Date End Date Taking? Authorizing Provider   brompheniramine-pseudoephedrine-DM 30-2-10 MG/5ML syrup Take 5 mLs by mouth 4 (four) times daily as needed. 12/03/17   Tommi Rumps, PA-C  fluticasone (FLONASE) 50 MCG/ACT nasal spray Place 1 spray into both nostrils daily for 10 days. 12/04/17 12/14/17  Rebecka Apley, MD  HYDROcodone-acetaminophen (NORCO) 5-325 MG tablet Take 1 tablet by mouth every 6 (six) hours as needed for up to 7 doses for severe pain. 03/27/18   Merrily Brittle, MD  methimazole (TAPAZOLE) 10 MG tablet Take 2 tablets (20 mg total) by mouth 2 (two) times daily. 02/18/17   Renae Fickle, MD  pseudoephedrine (SUDAFED) 30 MG tablet Take 1 tablet (30 mg total) by mouth every 6 (six) hours as needed for congestion. 12/04/17 12/04/18  Rebecka Apley, MD  traMADol (ULTRAM) 50 MG tablet Take 1 tablet (50 mg total) by mouth every 6 (six) hours as needed. 12/04/17   Rebecka Apley, MD    Allergies Penicillins  Family History  Problem Relation Age of Onset  . Thyroid disease Mother   . Hypertension Father   . Stroke Father   . Diabetes Brother   . Hypertension Brother     Social History Social History   Tobacco Use  . Smoking status: Current Every Day Smoker    Packs/day: 0.50    Years: 3.00    Pack years: 1.50    Types: Cigarettes  . Smokeless tobacco: Never Used  Substance Use Topics  . Alcohol use: Yes    Comment: Occasionally  . Drug use: No  Review of Systems  Constitutional: No fever/chills Cardiovascular: No chest pain. Respiratory:  No SOB. Gastrointestinal: No abdominal pain.  No nausea, no vomiting.  Musculoskeletal: Positive for back pain.  Skin: Negative for rash, abrasions, lacerations, ecchymosis. Neurological: Negative for headaches, numbness or tingling   ____________________________________________   PHYSICAL EXAM:  VITAL SIGNS: ED Triage Vitals  Enc Vitals Group     BP 04/23/18 1523 (!) 200/79     Pulse Rate 04/23/18 1523 (!) 101     Resp 04/23/18 1522 18     Temp  04/23/18 1522 98.4 F (36.9 C)     Temp Source 04/23/18 1522 Oral     SpO2 04/23/18 1523 99 %     Weight 04/23/18 1523 (!) 324 lb (147 kg)     Height 04/23/18 1523 5\' 7"  (1.702 m)     Head Circumference --      Peak Flow --      Pain Score 04/23/18 1523 10     Pain Loc --      Pain Edu? --      Excl. in GC? --      Constitutional: Alert and oriented. Well appearing and in no acute distress. Eyes: Conjunctivae are normal. PERRL. EOMI. Head: Atraumatic. ENT:      Ears:      Nose: No congestion/rhinnorhea.      Mouth/Throat: Mucous membranes are moist.  Neck: No stridor.   Cardiovascular: Normal rate, regular rhythm.  Good peripheral circulation. Respiratory: Normal respiratory effort without tachypnea or retractions. Lungs CTAB. Good air entry to the bases with no decreased or absent breath sounds. Gastrointestinal: Bowel sounds 4 quadrants. Soft and nontender to palpation. No guarding or rigidity. No palpable masses. No distention.  Musculoskeletal: Full range of motion to all extremities. No gross deformities appreciated.  Tenderness to palpation over inferior lumbar spine.  Negative straight leg raise.  Strength equal in lower extremities bilaterally. Neurologic:  Normal speech and language. No gross focal neurologic deficits are appreciated.  Skin:  Skin is warm, dry and intact. No rash noted. Psychiatric: Mood and affect are normal. Speech and behavior are normal. Patient exhibits appropriate insight and judgement.   ____________________________________________   LABS (all labs ordered are listed, but only abnormal results are displayed)  Labs Reviewed - No data to display ____________________________________________  EKG   ____________________________________________  RADIOLOGY  No results found.  ____________________________________________    PROCEDURES  Procedure(s) performed:    Procedures    Medications - No data to  display   ____________________________________________   INITIAL IMPRESSION / ASSESSMENT AND PLAN / ED COURSE  Pertinent labs & imaging results that were available during my care of the patient were reviewed by me and considered in my medical decision making (see chart for details).  Review of the [place] CSRS was performed in accordance of the [facility] prior to dispensing any controlled drugs.   Patient presented to the emergency department for evaluation of back pain on and off for 1 month.  Vital signs and exam are reassuring.  Patient left the emergency department after evaluation.  Symptoms are consistent with lumbar radiculopathy.  Plan of care was to include anti-inflammatories and muscle relaxers.     ____________________________________________  FINAL CLINICAL IMPRESSION(S) / ED DIAGNOSES  Final diagnoses:  Acute right-sided low back pain, with sciatica presence unspecified      NEW MEDICATIONS STARTED DURING THIS VISIT:  ED Discharge Orders    None  This chart was dictated using voice recognition software/Dragon. Despite best efforts to proofread, errors can occur which can change the meaning. Any change was purely unintentional.    Enid DerryWagner, Taylah Dubiel, PA-C 04/23/18 1752    Phineas SemenGoodman, [place], MD 04/24/18 [phone]

## 2018-04-27 ENCOUNTER — Other Ambulatory Visit: Payer: Self-pay

## 2018-04-27 ENCOUNTER — Emergency Department: Payer: Medicaid Other

## 2018-04-27 ENCOUNTER — Encounter: Payer: Self-pay | Admitting: Emergency Medicine

## 2018-04-27 DIAGNOSIS — R0789 Other chest pain: Secondary | ICD-10-CM | POA: Insufficient documentation

## 2018-04-27 DIAGNOSIS — I10 Essential (primary) hypertension: Secondary | ICD-10-CM | POA: Insufficient documentation

## 2018-04-27 DIAGNOSIS — Z79899 Other long term (current) drug therapy: Secondary | ICD-10-CM | POA: Insufficient documentation

## 2018-04-27 DIAGNOSIS — R079 Chest pain, unspecified: Secondary | ICD-10-CM | POA: Diagnosis present

## 2018-04-27 DIAGNOSIS — M7918 Myalgia, other site: Secondary | ICD-10-CM | POA: Insufficient documentation

## 2018-04-27 DIAGNOSIS — E0590 Thyrotoxicosis, unspecified without thyrotoxic crisis or storm: Secondary | ICD-10-CM | POA: Diagnosis not present

## 2018-04-27 DIAGNOSIS — F17210 Nicotine dependence, cigarettes, uncomplicated: Secondary | ICD-10-CM | POA: Diagnosis not present

## 2018-04-27 LAB — BASIC METABOLIC PANEL
Anion gap: 7 (ref 5–15)
BUN: 12 mg/dL (ref 6–20)
CALCIUM: 8.9 mg/dL (ref 8.9–10.3)
CO2: 26 mmol/L (ref 22–32)
CREATININE: 0.65 mg/dL (ref 0.44–1.00)
Chloride: 105 mmol/L (ref 101–111)
GFR calc Af Amer: >60 mL/min (ref >60)
GFR calc non Af Amer: >60 mL/min (ref >60)
GLUCOSE: 114 mg/dL — AB (ref 65–99)
Potassium: 3.9 mmol/L (ref 3.5–5.1)
Sodium: 138 mmol/L (ref 135–145)

## 2018-04-27 LAB — CBC
HCT: 38 % (ref 35.0–47.0)
Hemoglobin: 12.6 g/dL (ref 12.0–16.0)
MCH: 27.2 pg (ref 26.0–34.0)
MCHC: 33.1 g/dL (ref 32.0–36.0)
MCV: 82.2 fL (ref 80.0–100.0)
Platelets: 206 10*3/uL (ref 150–440)
RBC: 4.63 MIL/uL (ref 3.80–5.20)
RDW: 13.4 % (ref 11.5–14.5)
WBC: 5.1 10*3/uL (ref 3.6–11.0)

## 2018-04-27 LAB — TROPONIN I

## 2018-04-27 NOTE — ED Triage Notes (Signed)
Patient ambulatory to triage with steady gait, without difficulty or distress noted; pt reports left upper CP for "days"; denies radiating pain, denies accomp symptoms, denies hx of same

## 2018-04-28 ENCOUNTER — Emergency Department
Admission: EM | Admit: 2018-04-28 | Discharge: 2018-04-28 | Disposition: A | Discharge status: Home / Self Care | Payer: Medicaid Other | Attending: Emergency Medicine | Admitting: Emergency Medicine

## 2018-04-28 DIAGNOSIS — R0789 Other chest pain: Secondary | ICD-10-CM

## 2018-04-28 DIAGNOSIS — E0590 Thyrotoxicosis, unspecified without thyrotoxic crisis or storm: Secondary | ICD-10-CM

## 2018-04-28 LAB — TSH

## 2018-04-28 LAB — T4, FREE: FREE T4: 2.07 ng/dL — AB (ref 0.82–1.77)

## 2018-04-28 MED ORDER — METHIMAZOLE 10 MG PO TABS: 20.0000 mg | ORAL_TABLET | Freq: Two times a day (BID) | ORAL | 0 refills | Status: DC

## 2018-04-28 NOTE — ED Notes (Signed)
Pt and husband began speaking in code while I discharged them when discussing pain meds. Husband asked what she was supposed to take for pain. I explained IBU 600mg . Explained she would not get narcotics for muscle pain and that the priority was to have her follow up with endocrinologist. Pt states she has an appt in July.

## 2018-04-28 NOTE — ED Notes (Signed)
Lab called about new labwork that needed to be

## 2018-04-28 NOTE — ED Notes (Addendum)
Pt to tht er for Upper left chest/shoulder pain that is sharp. Pt reports it feels tight. Pain is consistent that has been going on for 3 weeks. Pt has not been to her doctor. Pt was dx with graves disease by her former md. Pt has not been on her methimazole for months. PCP will not write for refill. Pt husband is aggressive with speech and says that his wife takes too many pills of OTC meds because they do not work. He states the only medication that works is hydrocodone and tylenol 3. Asked pt if she was on other medications related to her Graves disease. Pt states no. Pt does not c/o pain much but more the husband speaking for her.

## 2018-04-28 NOTE — Discharge Instructions (Signed)
You have been seen in the Emergency Department (ED) today for chest pain.  As we have discussed today?s test results are normal, and we believe your pain is due to pain/strain and/or inflammation of the muscles and/or cartilage of your chest wall.  We recommend you take ibuprofen 600 mg three times a day with meals for the next 5 days (unless you have been told previously not to take ibuprofen or NSAIDs in general).  You may also take Tylenol according to the label instructions.  Read through the included information for additional treatment recommendations and precautions.  Regarding your hyperthyroidism, I refilled your methimazole once to tide you over until you see your new endocrinologist next month.  Future prescriptions will need to come from your endocrinologist or your primary care provider.  Continue to take your regular medications.   Return to the Emergency Department (ED) if you experience any further chest pain/pressure/tightness, difficulty breathing, or sudden sweating, or other symptoms that concern you.

## 2018-04-28 NOTE — ED Provider Notes (Signed)
[facility] Emergency Department Provider Note  ____________________________________________   First MD Initiated Contact with Patient 04/28/18 [phone]     (approximate)  I have reviewed the triage vital signs and the nursing notes.   HISTORY  Chief Complaint Chest Pain    HPI Kimberly Nolan is a 32 y.o. female with a history of hypothyroidism and morbid obesity as well as tobacco use who presents for evaluation of left anterior chest wall pain that is sharp and stabbing and severe.  She reports it is been going on for 3 weeks or more.  She also reports pain all throughout her body, in her back, legs, arms, etc., but reports that these pains come and go.  She believes they are related to her hyperthyroidism.  She is off of her methimazole and has been off of it for at least 3 months.  She states that her primary care doctor will not write her for a refill and she has an appointment with endocrinology but not for another month.  She denies fever/chills, nausea, vomiting, and abdominal pain.  She states that her whole body aches in addition to the sharp left anterior chest wall pain that is reproducible with palpation.  Nothing makes it better including high doses of ibuprofen and Tylenol (her husband also mentioned taking tramadol).  Moving around makes it worse.  Her husband is with her at bedside and is quite agitated and angry, frequently cutting into the conversation and talking about the severity of her pain and how no one will help her.  Past Medical History:  Diagnosis Date  . Essential hypertension   . Graves disease   . Morbid obesity (HCC)   . Smoker     Patient Active Problem List   Diagnosis Date Noted  . 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 Thyroiditis 02/17/2017  . Goiter 02/17/2017  . Hyperglycemia 02/17/2017  . Prediabetes 02/17/2017  . Morbid obesity (HCC) 02/17/2017  . Hyperthyroidism 02/16/2017  . Graves disease 02/16/2017  . Essential  hypertension 02/16/2017  . Current every day smoker 02/27/2015  . Morbid obesity with BMI of 50.0-59.9, adult (HCC) 02/27/2015  . Shortness of breath 09/13/2012  . CAP (community acquired pneumonia) 09/13/2012  . 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 Leukocytosis 09/13/2012  . Palpitations 09/13/2012    Past Surgical History:  Procedure Laterality Date  . CESAREAN SECTION      Prior to Admission medications   Medication Sig Start Date End Date Taking? Authorizing Provider  brompheniramine-pseudoephedrine-DM 30-2-10 MG/5ML syrup Take 5 mLs by mouth 4 (four) times daily as needed. 12/03/17   Tommi Rumps, PA-C  fluticasone (FLONASE) 50 MCG/ACT nasal spray Place 1 spray into both nostrils daily for 10 days. 12/04/17 12/14/17  Rebecka Apley, MD  HYDROcodone-acetaminophen (NORCO) 5-325 MG tablet Take 1 tablet by mouth every 6 (six) hours as needed for up to 7 doses for severe pain. 03/27/18   Merrily Brittle, MD  methimazole (TAPAZOLE) 10 MG tablet Take 2 tablets (20 mg total) by mouth 2 (two) times daily. 04/28/18   Loleta Rose, MD  pseudoephedrine (SUDAFED) 30 MG tablet Take 1 tablet (30 mg total) by mouth every 6 (six) hours as needed for congestion. 12/04/17 12/04/18  Rebecka Apley, MD  traMADol (ULTRAM) 50 MG tablet Take 1 tablet (50 mg total) by mouth every 6 (six) hours as needed. 12/04/17   Rebecka Apley, MD    Allergies Penicillins  Family History  Problem Relation Age of Onset  .  Thyroid disease Mother   . Hypertension Father   . Stroke Father   . Diabetes Brother   . Hypertension Brother     Social History Social History   Tobacco Use  . Smoking status: Current Every Day Smoker    Packs/day: 0.50    Years: 3.00    Pack years: 1.50    Types: Cigarettes  . Smokeless tobacco: Never Used  Substance Use Topics  . Alcohol use: Yes    Comment: Occasionally  . Drug use: No    Review of Systems Constitutional: No fever/chills Eyes: No visual changes. ENT:  No sore throat. Cardiovascular: Left-sided upper anterior chest wall pain for months Respiratory: Denies shortness of breath. Gastrointestinal: No abdominal pain.  No nausea, no vomiting.  No diarrhea.  No constipation. Genitourinary: Negative for dysuria. Musculoskeletal: Various assorted pains all throughout her body that have also been present for months Integumentary: Negative for rash. Neurological: Negative for headaches, focal weakness or numbness. Psychiatric:  ____________________________________________   PHYSICAL EXAM:  VITAL SIGNS: ED Triage Vitals  Enc Vitals Group     BP 04/27/18 2237 (!) 156/88     Pulse Rate 04/27/18 2237 (!) 112     Resp 04/27/18 2237 20     Temp 04/27/18 2237 98.4 F (36.9 C)     Temp Source 04/27/18 2237 Oral     SpO2 04/27/18 2237 100 %     Weight 04/27/18 2235 (!) 159.7 kg (352 lb)     Height 04/27/18 2235 1.702 m (5\' 7" )     Head Circumference --      Peak Flow --      Pain Score 04/27/18 2235 10     Pain Loc --      Pain Edu? --      Excl. in GC? --     Constitutional: Alert and oriented. Well appearing and in no acute distress. Eyes: Conjunctivae are normal.  Head: Atraumatic. Nose: No congestion/rhinnorhea. Mouth/Throat: Mucous membranes are moist. Neck: No stridor.  No meningeal signs.   Cardiovascular: Normal rate, regular rhythm. Good peripheral circulation. Grossly normal heart sounds.  Highly reproducible moderate to severe chest wall tenderness to palpation particularly of the left upper anterior chest wall, but essentially present throughout. Respiratory: Normal respiratory effort.  No retractions. Lungs CTAB. Gastrointestinal: Morbid obesity.  Soft and nontender. No distention.  Musculoskeletal: No lower extremity tenderness nor edema. No gross deformities of extremities. Neurologic:  Normal speech and language. No gross focal neurologic deficits are appreciated.  Skin:  Skin is warm, dry and intact. No rash  noted. Psychiatric: Mood and affect are normal. Speech and behavior are normal.  ____________________________________________   LABS (all labs ordered are listed, but only abnormal results are displayed)  Labs Reviewed  BASIC METABOLIC PANEL - Abnormal; Notable for the following components:      Result Value   Glucose, Bld 114 (*)    All other components within normal limits  T4, FREE - Abnormal; Notable for the following components:   Free T4 2.07 (*)    All other components within normal limits  TSH - Abnormal; Notable for the following components:   TSH <0.010 (*)    All other components within normal limits  CBC  TROPONIN I   ____________________________________________  EKG  ED ECG REPORT I, Loleta Rose, the attending physician, personally viewed and interpreted this ECG.  Date: 04/27/2018 EKG Time: 22:38 Rate: 103 Rhythm: mild sinus tachycardia QRS Axis: normal Intervals: normal ST/T Wave abnormalities:  No clinically significant ST/T wave abnormalities, no evidence of acute ischemia. Narrative Interpretation: no evidence of acute ischemia   ____________________________________________  RADIOLOGY I, Loleta Rose, personally viewed and evaluated these images (plain radiographs) as part of my medical decision making, as well as reviewing the written report by the radiologist.  ED MD interpretation:  No acute cardiopulmonary disease  Official radiology report(s): Dg Chest 2 View  Result Date: 04/27/2018 CLINICAL DATA:  Chest pain EXAM: CHEST - 2 VIEW COMPARISON:  None. FINDINGS: The heart size and mediastinal contours are within normal limits. Both lungs are clear. The visualized skeletal structures are unremarkable. IMPRESSION: No active cardiopulmonary disease. Electronically Signed   By: Gerome Sam III M.D   On: 04/27/2018 23:02    ____________________________________________   PROCEDURES  Critical Care performed: No   Procedure(s) performed:    Procedures   ____________________________________________   INITIAL IMPRESSION / ASSESSMENT AND PLAN / ED COURSE  As part of my medical decision making, I reviewed the following data within the electronic MEDICAL RECORD NUMBER History obtained from family, Nursing notes reviewed and incorporated, Labs reviewed , EKG interpreted , Old chart reviewed, Radiograph reviewed  and Notes from prior ED visits    Differential diagnosis includes, but is not limited to, musculoskeletal pain, costochondritis, ACS, PE, pneumothorax, infectious process.  The patient has had 7 visits to the ED for a variety of complaints in the last 6 months.  She admits she does not go to her primary care doctor and according to the primary care doctor notes, reviewed in [facility], she was noncompliant in following up with rheumatology so they told her they would no longer write for methimazole.  The patient reports that she has an appointment a month.  She in particular her husband are very frustrated about her ongoing symptoms but their etiology is unclear and the symptoms have been chronic for months.  I explained that I would add on thyroid function test and consider prescribing methimazole, but I explained that her medical work-up today from the emergency department perspective is reassuring with normal vital signs, no tachycardia (except for when she first checked in a walk-in to triage), normal EKG, metabolic panel, troponin, chest x-ray, and CBC.  I explained that her chronic symptoms will require outpatient follow-up.  I will follow-up on her thyroid function add-ons but she will most likely need to continue on over-the-counter medication.  It is inappropriate to prescribe narcotics for chronic muscular skeletal chest wall pain.  Clinical Course as of Apr 28 517  Fri Apr 28, 2018  0104 I reviewed the patient's prescription history over the last 24 months in the multi-state controlled substances database(s) that includes  [place], [place], [place], [place], [place], [place], [place], [place], [place], [place], [place], [place], [place], [place], and [place].  Results were notable for 5 prescriptions for narcotics over the last 2 years, most recently she received 7 Norco 3 days ago, 12 Norco about 5 months ago, and 3 other equally small prescriptions over the last year.   [CF]  0228 TSH(!): <0.010 [CF]  0228 T4,Free(Direct)(!): 2.07 [CF]    Clinical Course User Index [CF] Loleta Rose, MD    (Note that documentation was delayed due to multiple ED patients requiring immediate care.)  Patient's labs were all reassuring and there is no evidence that she is having ACS.  She PERC negative.  Highly reproducible muscular skeletal pain.  In an attempt to help her I refilled her methimazole prescription one  time to tide her over until she sees her new endocrinologist.  I stressed the importance of outpatient follow-up.  I recommended over-the-counter medication for her musculoskeletal chest wall pain.   I gave my usual and customary return precautions.    ____________________________________________  FINAL CLINICAL IMPRESSION(S) / ED DIAGNOSES  Final diagnoses:  Anterior chest wall pain  Hyperthyroidism     MEDICATIONS GIVEN DURING THIS VISIT:  Medications - No data to display   ED Discharge Orders        Ordered    methimazole (TAPAZOLE) 10 MG tablet  2 times daily     04/28/18 0233       Note:  This document was prepared using Dragon voice recognition software and may include unintentional dictation errors.    Loleta RoseForbach, Drusilla Wampole, MD 04/28/18 [phone]

## 2018-06-27 ENCOUNTER — Other Ambulatory Visit: Payer: Self-pay

## 2018-06-27 ENCOUNTER — Emergency Department
Admission: EM | Admit: 2018-06-27 | Discharge: 2018-06-28 | Disposition: A | Discharge status: Home / Self Care | Payer: Self-pay | Attending: Emergency Medicine | Admitting: Emergency Medicine

## 2018-06-27 DIAGNOSIS — Z79899 Other long term (current) drug therapy: Secondary | ICD-10-CM | POA: Insufficient documentation

## 2018-06-27 DIAGNOSIS — F17210 Nicotine dependence, cigarettes, uncomplicated: Secondary | ICD-10-CM | POA: Insufficient documentation

## 2018-06-27 DIAGNOSIS — R0789 Other chest pain: Secondary | ICD-10-CM | POA: Insufficient documentation

## 2018-06-27 DIAGNOSIS — I10 Essential (primary) hypertension: Secondary | ICD-10-CM | POA: Insufficient documentation

## 2018-06-27 DIAGNOSIS — R600 Localized edema: Secondary | ICD-10-CM | POA: Insufficient documentation

## 2018-06-27 DIAGNOSIS — R609 Edema, unspecified: Secondary | ICD-10-CM

## 2018-06-27 LAB — COMPREHENSIVE METABOLIC PANEL
ALT: 14 U/L (ref 0–44)
AST: 13 U/L — ABNORMAL LOW (ref 15–41)
Albumin: 3 g/dL — ABNORMAL LOW (ref 3.5–5.0)
Alkaline Phosphatase: 79 U/L (ref 38–126)
Anion gap: 6 (ref 5–15)
BUN: 11 mg/dL (ref 6–20)
CHLORIDE: 110 mmol/L (ref 98–111)
CO2: 24 mmol/L (ref 22–32)
Calcium: 8.4 mg/dL — ABNORMAL LOW (ref 8.9–10.3)
Creatinine, Ser: 0.48 mg/dL (ref 0.44–1.00)
Glucose, Bld: 105 mg/dL — ABNORMAL HIGH (ref 70–99)
POTASSIUM: 3.6 mmol/L (ref 3.5–5.1)
Sodium: 140 mmol/L (ref 135–145)
Total Bilirubin: 0.4 mg/dL (ref 0.3–1.2)
Total Protein: 7 g/dL (ref 6.5–8.1)

## 2018-06-27 LAB — CBC
HCT: 35.6 % (ref 35.0–47.0)
Hemoglobin: 11.7 g/dL — ABNORMAL LOW (ref 12.0–16.0)
MCH: 27.6 pg (ref 26.0–34.0)
MCHC: 32.9 g/dL (ref 32.0–36.0)
MCV: 84.1 fL (ref 80.0–100.0)
PLATELETS: 215 10*3/uL (ref 150–440)
RBC: 4.23 MIL/uL (ref 3.80–5.20)
RDW: 13.6 % (ref 11.5–14.5)
WBC: 5.1 10*3/uL (ref 3.6–11.0)

## 2018-06-27 LAB — TROPONIN I

## 2018-06-27 NOTE — ED Triage Notes (Signed)
Pt in with co left sided chest pain states has had it for months. Has been here for the same and was told follow up with pmd but has not. Pt is here for persistent pain.

## 2018-06-28 ENCOUNTER — Emergency Department: Payer: Self-pay

## 2018-06-28 NOTE — ED Provider Notes (Signed)
[facility] Emergency Department Provider Note ____________________________________________   First MD Initiated Contact with Patient 06/27/18 2343     (approximate)  I have reviewed the triage vital signs and the nursing notes.   HISTORY  Chief Complaint Chest Pain    HPI Kimberly Nolan is a 32 y.o. female with PMH as noted below who presents with multiple complaints.  The patient primarily reports chest pain which is sharp, substernal and left-sided, occurs up to 20 times per day and lasts for 1 or 2 minutes.  She states that it is been present for months and has not changed recently.  She also reports some bilateral lower extremity swelling which has been gradual for the last several weeks, as well as left eye intermittent blurred vision which is been going on for 1 to 2 months.  The patient also reports that she has not been able to take her methimazole since she tried to follow-up with endocrinology but could not afford the appointment, and cannot afford medications.   Past Medical History:  Diagnosis Date  . Essential hypertension   . Graves disease   . Morbid obesity (HCC)   . Smoker     Patient Active Problem List   Diagnosis Date Noted  . 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 Thyroiditis 02/17/2017  . Goiter 02/17/2017  . Hyperglycemia 02/17/2017  . Prediabetes 02/17/2017  . Morbid obesity (HCC) 02/17/2017  . Hyperthyroidism 02/16/2017  . Graves disease 02/16/2017  . Essential hypertension 02/16/2017  . Current every day smoker 02/27/2015  . Morbid obesity with BMI of 50.0-59.9, adult (HCC) 02/27/2015  . Shortness of breath 09/13/2012  . CAP (community acquired pneumonia) 09/13/2012  . 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 Leukocytosis 09/13/2012  . Palpitations 09/13/2012    Past Surgical History:  Procedure Laterality Date  . CESAREAN SECTION      Prior to Admission medications   Medication Sig Start Date End Date Taking? Authorizing  Provider  brompheniramine-pseudoephedrine-DM 30-2-10 MG/5ML syrup Take 5 mLs by mouth 4 (four) times daily as needed. 12/03/17   Tommi RumpsSummers, Rhonda L, PA-C  fluticasone (FLONASE) 50 MCG/ACT nasal spray Place 1 spray into both nostrils daily for 10 days. 12/04/17 12/14/17  Rebecka ApleyWebster, Allison P, MD  HYDROcodone-acetaminophen (NORCO) 5-325 MG tablet Take 1 tablet by mouth every 6 (six) hours as needed for up to 7 doses for severe pain. 03/27/18   Merrily Brittleifenbark, Neil, MD  methimazole (TAPAZOLE) 10 MG tablet Take 2 tablets (20 mg total) by mouth 2 (two) times daily. 04/28/18   Loleta RoseForbach, Cory, MD  pseudoephedrine (SUDAFED) 30 MG tablet Take 1 tablet (30 mg total) by mouth every 6 (six) hours as needed for congestion. 12/04/17 12/04/18  Rebecka ApleyWebster, Allison P, MD  traMADol (ULTRAM) 50 MG tablet Take 1 tablet (50 mg total) by mouth every 6 (six) hours as needed. 12/04/17   Rebecka ApleyWebster, Allison P, MD    Allergies Penicillins  Family History  Problem Relation Age of Onset  . Thyroid disease Mother   . Hypertension Father   . Stroke Father   . Diabetes Brother   . Hypertension Brother     Social History Social History   Tobacco Use  . Smoking status: Current Every Day Smoker    Packs/day: 0.50    Years: 3.00    Pack years: 1.50    Types: Cigarettes  . Smokeless tobacco: Never Used  Substance Use Topics  . Alcohol use: Yes    Comment: Occasionally  . Drug use: No  Review of Systems  Constitutional: No fever. Eyes: Positive for left eye discomfort and blurred vision. ENT: No sore throat. Cardiovascular: Positive for chest pain. Respiratory: Denies shortness of breath. Gastrointestinal: No vomiting.  Genitourinary: Negative for dysuria.  Musculoskeletal: Negative for back pain. Skin: Negative for rash. Neurological: Negative for headache.   ____________________________________________   PHYSICAL EXAM:  VITAL SIGNS: ED Triage Vitals [06/27/18 2245]  Enc Vitals Group     BP (!) 159/102     Pulse  Rate 88     Resp 20     Temp 98.7 F (37.1 C)     Temp Source Oral     SpO2 100 %     Weight (!) 340 lb (154.2 kg)     Height 5\' 7"  (1.702 m)     Head Circumference      Peak Flow      Pain Score 9     Pain Loc      Pain Edu?      Excl. in GC?     Constitutional: Alert and oriented.  Relatively well appearing and in no acute distress. Eyes: Conjunctivae are normal.  EOMI.  PERRLA.  Left eye appears normal externally. Head: Atraumatic. Nose: No congestion/rhinnorhea. Mouth/Throat: Mucous membranes are moist.   Neck: Normal range of motion.  Cardiovascular: Normal rate, regular rhythm. Grossly normal heart sounds.  Good peripheral circulation.  Reproducible chest wall tenderness. Respiratory: Normal respiratory effort.  No retractions. Lungs CTAB. Gastrointestinal: Soft and nontender. No distention.  Genitourinary: No flank tenderness. Musculoskeletal: Trace bilateral lower extremity edema.  Extremities warm and well perfused.  Neurologic:  Normal speech and language. No gross focal neurologic deficits are appreciated.  Skin:  Skin is warm and dry. No rash noted. Psychiatric: Mood and affect are normal. Speech and behavior are normal.  ____________________________________________   LABS (all labs ordered are listed, but only abnormal results are displayed)  Labs Reviewed  CBC - Abnormal; Notable for the following components:      Result Value   Hemoglobin 11.7 (*)    All other components within normal limits  COMPREHENSIVE METABOLIC PANEL - Abnormal; Notable for the following components:   Glucose, Bld 105 (*)    Calcium 8.4 (*)    Albumin 3.0 (*)    AST 13 (*)    All other components within normal limits  TROPONIN I   ____________________________________________  EKG  ED ECG REPORT I, Dionne BucySebastian Payson Evrard, the attending physician, personally viewed and interpreted this ECG.  Date: 06/28/2018 EKG Time: 2250 Rate: 86 Rhythm: normal sinus rhythm QRS Axis:  normal Intervals: normal ST/T Wave abnormalities: normal Narrative Interpretation: no evidence of acute ischemia  ____________________________________________  RADIOLOGY  CXR: No focal infiltrate or acute edema ____________________________________________   PROCEDURES  Procedure(s) performed: No  Procedures  Critical Care performed: No ____________________________________________   INITIAL IMPRESSION / ASSESSMENT AND PLAN / ED COURSE  Pertinent labs & imaging results that were available during my care of the patient were reviewed by me and considered in my medical decision making (see chart for details).  32 year old female with PMH as noted above presents with multiple complaints, primarily chronic chest pain, some subacute lower extremity edema, and left eye discomfort and intermittent blurred vision in that eye.  She also states that she has not been able to take methimazole for her thyroid because she cannot afford it, and was not able to afford following up with the endocrinologist.  She reports that she has Medicaid but she states  there is some issues with the need to get straightened out.  On exam, the patient is comfortable appearing, she is hypertensive but has otherwise normal vital signs, and the remainder of the exam is unremarkable.  I reviewed the past medical records in Epic; the patient was last seen here in June reporting the same chest pain and had negative work-up at that time.  She was given a prescription for methimazole, although she states that this would not help now because she cannot afford to pay for it.  Initial work-up is unremarkable.  No indication for repeat troponin due to the duration of the symptoms.  I added a chest x-ray because the patient's trace lower extremity edema to verify that there is no pulmonary edema, and it is negative.  Overall the patient's presentation is consistent with chronic musculoskeletal chest wall pain.  Based on the  chronicity of the symptoms there is no evidence of ACS, PE, or vascular etiology.  Trace bilateral lower extremity edema is likely dependent and there is no evidence of cardiac etiology.  There is no evidence of acute ophthalmologic issue.  The patient mainly is in need of ongoing primary care and specialist referral that she can afford.  I explained to the patient that there is not much we can do for her from the emergency department especially in the middle the night except to rule out dangerous etiologies of her symptoms.  ----------------------------------------- 1:39 AM on 06/28/2018 -----------------------------------------  I again discussed the results of the work-up with the patient.  She feels comfortable to go home.  [facility].  Return precautions given, and she expresses understanding. ____________________________________________   FINAL CLINICAL IMPRESSION(S) / ED DIAGNOSES  Final diagnoses:  Atypical chest pain  Peripheral edema      NEW MEDICATIONS STARTED DURING THIS VISIT:  New Prescriptions   No medications on file     Note:  This document was prepared using Dragon voice recognition software and may include unintentional dictation errors.    Dionne Bucy, MD 06/28/18 [phone]

## 2018-06-28 NOTE — Discharge Instructions (Addendum)
Follow-up [facility] to establish care with a primary care doctor that you will have access to.  Return to the ER for new, worsening, persistent severe chest pain, swelling, vision changes, eye pain, difficulty breathing, weakness, or any other new or worsening symptoms that concern you.

## 2018-08-08 ENCOUNTER — Emergency Department
Admission: EM | Admit: 2018-08-08 | Discharge: 2018-08-08 | Disposition: A | Discharge status: Home / Self Care | Payer: Medicaid Other | Attending: Emergency Medicine | Admitting: Emergency Medicine

## 2018-08-08 ENCOUNTER — Other Ambulatory Visit: Payer: Self-pay

## 2018-08-08 DIAGNOSIS — R7303 Prediabetes: Secondary | ICD-10-CM | POA: Diagnosis not present

## 2018-08-08 DIAGNOSIS — E079 Disorder of thyroid, unspecified: Secondary | ICD-10-CM | POA: Diagnosis not present

## 2018-08-08 DIAGNOSIS — B353 Tinea pedis: Secondary | ICD-10-CM | POA: Insufficient documentation

## 2018-08-08 DIAGNOSIS — I10 Essential (primary) hypertension: Secondary | ICD-10-CM | POA: Diagnosis not present

## 2018-08-08 DIAGNOSIS — F17210 Nicotine dependence, cigarettes, uncomplicated: Secondary | ICD-10-CM | POA: Insufficient documentation

## 2018-08-08 DIAGNOSIS — L853 Xerosis cutis: Secondary | ICD-10-CM | POA: Diagnosis present

## 2018-08-08 DIAGNOSIS — Z79899 Other long term (current) drug therapy: Secondary | ICD-10-CM | POA: Insufficient documentation

## 2018-08-08 MED ORDER — TERBINAFINE HCL 1 % EX CREA
TOPICAL_CREAM | Freq: Every day | CUTANEOUS | Status: DC
  Administered 2018-08-08: 05:00:00 via TOPICAL
  Filled 2018-08-08: qty 12

## 2018-08-08 NOTE — ED Notes (Signed)
Pt is waiting on medication from pharmacy

## 2018-08-08 NOTE — ED Triage Notes (Signed)
Pt in with crack to posterior left foot for 3 weeks, states not healing well. Pt also wants "patches of dry skin" states just noticed few days ago.

## 2018-08-08 NOTE — ED Provider Notes (Signed)
[facility] Emergency Department Provider Note    First MD Initiated Contact with Patient 08/08/18 0411     (approximate)  I have reviewed the triage vital signs and the nursing notes.   HISTORY  Chief Complaint Wound Check   HPI Kimberly Nolan is a 32 y.o. female below list of chronic medical conditions presents to the emergency department with complaint of "cracked to the left heel x3 weeks" that is not healing well despite applying Tinactin.  Patient denies any fever no nausea vomiting.  Patient denies any polyuria polydipsia.  Patient denies any pain in the area on her left heel  Past Medical History:  Diagnosis Date  . Essential hypertension   . Graves disease   . Morbid obesity (HCC)   . Smoker     Patient Active Problem List   Diagnosis Date Noted  . 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 Thyroiditis 02/17/2017  . Goiter 02/17/2017  . Hyperglycemia 02/17/2017  . Prediabetes 02/17/2017  . Morbid obesity (HCC) 02/17/2017  . Hyperthyroidism 02/16/2017  . Graves disease 02/16/2017  . Essential hypertension 02/16/2017  . Current every day smoker 02/27/2015  . Morbid obesity with BMI of 50.0-59.9, adult (HCC) 02/27/2015  . Shortness of breath 09/13/2012  . CAP (community acquired pneumonia) 09/13/2012  . 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 Leukocytosis 09/13/2012  . Palpitations 09/13/2012    Past Surgical History:  Procedure Laterality Date  . CESAREAN SECTION      Prior to Admission medications   Medication Sig Start Date End Date Taking? Authorizing Provider  brompheniramine-pseudoephedrine-DM 30-2-10 MG/5ML syrup Take 5 mLs by mouth 4 (four) times daily as needed. 12/03/17   Tommi RumpsSummers, Rhonda L, PA-C  fluticasone (FLONASE) 50 MCG/ACT nasal spray Place 1 spray into both nostrils daily for 10 days. 12/04/17 12/14/17  Rebecka ApleyWebster, Allison P, MD  HYDROcodone-acetaminophen (NORCO) 5-325 MG tablet Take 1 tablet by mouth every 6 (six) hours as needed for up to  7 doses for severe pain. 03/27/18   Merrily Brittleifenbark, Neil, MD  methimazole (TAPAZOLE) 10 MG tablet Take 2 tablets (20 mg total) by mouth 2 (two) times daily. 04/28/18   Loleta RoseForbach, Cory, MD  pseudoephedrine (SUDAFED) 30 MG tablet Take 1 tablet (30 mg total) by mouth every 6 (six) hours as needed for congestion. 12/04/17 12/04/18  Rebecka ApleyWebster, Allison P, MD  traMADol (ULTRAM) 50 MG tablet Take 1 tablet (50 mg total) by mouth every 6 (six) hours as needed. 12/04/17   Rebecka ApleyWebster, Allison P, MD    Allergies Penicillins  Family History  Problem Relation Age of Onset  . Thyroid disease Mother   . Hypertension Father   . Stroke Father   . Diabetes Brother   . Hypertension Brother     Social History Social History   Tobacco Use  . Smoking status: Current Every Day Smoker    Packs/day: 0.50    Years: 3.00    Pack years: 1.50    Types: Cigarettes  . Smokeless tobacco: Never Used  Substance Use Topics  . Alcohol use: Yes    Comment: Occasionally  . Drug use: No    Review of Systems Constitutional: No fever/chills Eyes: No visual changes. ENT: No sore throat. Cardiovascular: Denies chest pain. Respiratory: Denies shortness of breath. Gastrointestinal: No abdominal pain.  No nausea, no vomiting.  No diarrhea.  No constipation. Genitourinary: Negative for dysuria. Musculoskeletal: Negative for neck pain.  Negative for back pain. Integumentary: Positive for athlete's foot Neurological: Negative for headaches, focal weakness or  numbness.   ____________________________________________   PHYSICAL EXAM:  VITAL SIGNS: ED Triage Vitals  Enc Vitals Group     BP 08/08/18 0106 (!) 157/86     Pulse Rate 08/08/18 0106 (!) 102     Resp 08/08/18 0106 18     Temp 08/08/18 0106 98.4 F (36.9 C)     Temp Source 08/08/18 0106 Oral     SpO2 08/08/18 0106 100 %     Weight 08/08/18 0105 (!) 145.2 kg (320 lb)     Height 08/08/18 0105 1.702 m (5\' 7" )     Head Circumference --      Peak Flow --      Pain  Score 08/08/18 0105 10     Pain Loc --      Pain Edu? --      Excl. in GC? --     Constitutional: Alert and oriented. Well appearing and in no acute distress Musculoskeletal: No lower extremity tenderness nor edema. No gross deformities of extremities. Neurologic:  Normal speech and language. No gross focal neurologic deficits are appreciated.  Skin: Thickened skin noted on the plantar aspect of the foot with fissures consistent with Tinea pedis Psychiatric: Mood and affect are normal. Speech and behavior are normal.    Procedures   ____________________________________________   INITIAL IMPRESSION / ASSESSMENT AND PLAN / ED COURSE  As part of my medical decision making, I reviewed the following data within the electronic MEDICAL RECORD NUMBER   32 year old female presented with above-stated history and physical exam consistent with athlete's foot.  Patient given terbinafine with recommendation to follow-up with primary care provider ____________________________________________  FINAL CLINICAL IMPRESSION(S) / ED DIAGNOSES  Final diagnoses:  Tinea pedis of both feet     MEDICATIONS GIVEN DURING THIS VISIT:  Medications  terbinafine (LAMISIL) 1 % cream (has no administration in time range)     ED Discharge Orders    None       Note:  This document was prepared using Dragon voice recognition software and may include unintentional dictation errors.    Darci Current, MD 08/08/18 2231

## 2018-08-21 ENCOUNTER — Emergency Department: Payer: Medicaid Other

## 2018-08-21 ENCOUNTER — Encounter: Payer: Self-pay | Admitting: Emergency Medicine

## 2018-08-21 ENCOUNTER — Other Ambulatory Visit: Payer: Self-pay

## 2018-08-21 ENCOUNTER — Emergency Department
Admission: EM | Admit: 2018-08-21 | Discharge: 2018-08-21 | Disposition: A | Discharge status: Home / Self Care | Payer: Medicaid Other | Attending: Emergency Medicine | Admitting: Emergency Medicine

## 2018-08-21 DIAGNOSIS — W182XXA Fall in (into) shower or empty bathtub, initial encounter: Secondary | ICD-10-CM | POA: Diagnosis not present

## 2018-08-21 DIAGNOSIS — Z79899 Other long term (current) drug therapy: Secondary | ICD-10-CM | POA: Insufficient documentation

## 2018-08-21 DIAGNOSIS — Y999 Unspecified external cause status: Secondary | ICD-10-CM | POA: Insufficient documentation

## 2018-08-21 DIAGNOSIS — M5441 Lumbago with sciatica, right side: Secondary | ICD-10-CM | POA: Diagnosis not present

## 2018-08-21 DIAGNOSIS — Y93E1 Activity, personal bathing and showering: Secondary | ICD-10-CM | POA: Diagnosis not present

## 2018-08-21 DIAGNOSIS — G8929 Other chronic pain: Secondary | ICD-10-CM

## 2018-08-21 DIAGNOSIS — I10 Essential (primary) hypertension: Secondary | ICD-10-CM | POA: Insufficient documentation

## 2018-08-21 DIAGNOSIS — Y929 Unspecified place or not applicable: Secondary | ICD-10-CM | POA: Diagnosis not present

## 2018-08-21 DIAGNOSIS — W19XXXA Unspecified fall, initial encounter: Secondary | ICD-10-CM

## 2018-08-21 DIAGNOSIS — F17210 Nicotine dependence, cigarettes, uncomplicated: Secondary | ICD-10-CM | POA: Diagnosis not present

## 2018-08-21 MED ORDER — HYDROCODONE-ACETAMINOPHEN 5-325 MG PO TABS
1.0000 | ORAL_TABLET | Freq: Once | ORAL | Status: AC
  Administered 2018-08-21: 1 via ORAL
  Filled 2018-08-21: qty 1

## 2018-08-21 MED ORDER — METHOCARBAMOL 500 MG PO TABS: 500.0000 mg | ORAL_TABLET | Freq: Four times a day (QID) | ORAL | 0 refills | Status: DC

## 2018-08-21 MED ORDER — MELOXICAM 15 MG PO TABS: 15.0000 mg | ORAL_TABLET | Freq: Every day | ORAL | 0 refills | Status: DC

## 2018-08-21 MED ORDER — ONDANSETRON 8 MG PO TBDP
8.0000 mg | ORAL_TABLET | Freq: Once | ORAL | Status: AC
  Administered 2018-08-21: 8 mg via ORAL
  Filled 2018-08-21: qty 1

## 2018-08-21 NOTE — ED Triage Notes (Signed)
Slipped and fell getting out of choser about ago. Pain back.

## 2018-08-21 NOTE — ED Notes (Signed)
Pt refused a pregnancy test. Pt advised that she is on her period right now and that she has refused and signed something before saying she wanted the x-ray without a pregnancy test.

## 2018-08-21 NOTE — ED Provider Notes (Signed)
[facility] Emergency Department Provider Note  ____________________________________________  Time seen: Approximately 5:21 PM  I have reviewed the triage vital signs and the nursing notes.   HISTORY  Chief Complaint Back Pain    HPI Kimberly Nolan is a 32 y.o. female who presents the emergency department complaining of lower back pain radiating into her right leg.  Patient reports that she slipped getting out of the shower, fell.  Patient reports that she landed on her back but she is unsure whether she landed directly on her lumbar spine or landing on her buttocks.  Patient is having pain radiating into the right lower extremity but denies any bowel or bladder dysfunction, saddle anesthesia, paresthesias.  Patient has a history of chronic sciatica, primarily affecting right side.  Patient did not hit her head or lose consciousness.  No other complaints at this time.    Past Medical History:  Diagnosis Date  . Essential hypertension   . Graves disease   . Morbid obesity (HCC)   . Smoker     Patient Active Problem List   Diagnosis Date Noted  . 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 Thyroiditis 02/17/2017  . Goiter 02/17/2017  . Hyperglycemia 02/17/2017  . Prediabetes 02/17/2017  . Morbid obesity (HCC) 02/17/2017  . Hyperthyroidism 02/16/2017  . Graves disease 02/16/2017  . Essential hypertension 02/16/2017  . Current every day smoker 02/27/2015  . Morbid obesity with BMI of 50.0-59.9, adult (HCC) 02/27/2015  . Shortness of breath 09/13/2012  . CAP (community acquired pneumonia) 09/13/2012  . 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 Leukocytosis 09/13/2012  . Palpitations 09/13/2012    Past Surgical History:  Procedure Laterality Date  . CESAREAN SECTION      Prior to Admission medications   Medication Sig Start Date End Date Taking? Authorizing Provider  brompheniramine-pseudoephedrine-DM 30-2-10 MG/5ML syrup Take 5 mLs by mouth 4 (four) times daily as  needed. 12/03/17   Tommi Rumps, PA-C  fluticasone (FLONASE) 50 MCG/ACT nasal spray Place 1 spray into both nostrils daily for 10 days. 12/04/17 12/14/17  Rebecka Apley, MD  HYDROcodone-acetaminophen (NORCO) 5-325 MG tablet Take 1 tablet by mouth every 6 (six) hours as needed for up to 7 doses for severe pain. 03/27/18   Merrily Brittle, MD  meloxicam (MOBIC) 15 MG tablet Take 1 tablet (15 mg total) by mouth daily. 08/21/18   Cuthriell, Delorise Royals, PA-C  methimazole (TAPAZOLE) 10 MG tablet Take 2 tablets (20 mg total) by mouth 2 (two) times daily. 04/28/18   Loleta Rose, MD  methocarbamol (ROBAXIN) 500 MG tablet Take 1 tablet (500 mg total) by mouth 4 (four) times daily. 08/21/18   Cuthriell, Delorise Royals, PA-C  pseudoephedrine (SUDAFED) 30 MG tablet Take 1 tablet (30 mg total) by mouth every 6 (six) hours as needed for congestion. 12/04/17 12/04/18  Rebecka Apley, MD  traMADol (ULTRAM) 50 MG tablet Take 1 tablet (50 mg total) by mouth every 6 (six) hours as needed. 12/04/17   Rebecka Apley, MD    Allergies Penicillins  Family History  Problem Relation Age of Onset  . Thyroid disease Mother   . Hypertension Father   . Stroke Father   . Diabetes Brother   . Hypertension Brother     Social History Social History   Tobacco Use  . Smoking status: Current Every Day Smoker    Packs/day: 0.50    Years: 3.00    Pack years: 1.50    Types: Cigarettes  . Smokeless  tobacco: Never Used  Substance Use Topics  . Alcohol use: Yes    Comment: Occasionally  . Drug use: No     Review of Systems  Constitutional: No fever/chills Eyes: No visual changes. No discharge ENT: No upper respiratory complaints. Cardiovascular: no chest pain. Respiratory: no cough. No SOB. Gastrointestinal: No abdominal pain.  No nausea, no vomiting.  No diarrhea.  No constipation. Genitourinary: Negative for dysuria. No hematuria Musculoskeletal: Positive for lower back pain radiating into the right lower  extremity Skin: Negative for rash, abrasions, lacerations, ecchymosis. Neurological: Negative for headaches, focal weakness or numbness. 10-point ROS otherwise negative.  ____________________________________________   PHYSICAL EXAM:  VITAL SIGNS: ED Triage Vitals  Enc Vitals Group     BP 08/21/18 1638 (!) 159/83     Pulse Rate 08/21/18 1638 95     Resp 08/21/18 1638 18     Temp 08/21/18 1638 98.8 F (37.1 C)     Temp Source 08/21/18 1638 Oral     SpO2 08/21/18 1638 98 %     Weight 08/21/18 1641 (!) 330 lb (149.7 kg)     Height 08/21/18 1641 5\' 7"  (1.702 m)     Head Circumference --      Peak Flow --      Pain Score 08/21/18 1641 10     Pain Loc --      Pain Edu? --      Excl. in GC? --      Constitutional: Alert and oriented. Well appearing and in no acute distress.  Patient is morbidly obese. Eyes: Conjunctivae are normal. PERRL. EOMI. Head: Atraumatic. Neck: No stridor.    Cardiovascular: Normal rate, regular rhythm. Normal S1 and S2.  Good peripheral circulation. Respiratory: Normal respiratory effort without tachypnea or retractions. Lungs CTAB. Good air entry to the bases with no decreased or absent breath sounds. Gastrointestinal: Bowel sounds 4 quadrants. Soft and nontender to palpation. No guarding or rigidity. No palpable masses. No distention. No CVA tenderness. Musculoskeletal: Full range of motion to all extremities. No gross deformities appreciated.  No visible abnormality to the lumbar spine.  No visible ecchymosis or abrasions or lacerations.  Patient reports tenderness to palpation throughout the lumbar spine without point specific tenderness.  No palpable abnormality in the lumbar spine.  Patient is tender to palpation over right-sided sciatic notch.  Dorsalis pedis pulse intact bilateral lower extremity.  Sensation intact and equal bilateral lower extremities. Neurologic:  Normal speech and language. No gross focal neurologic deficits are appreciated.  Skin:   Skin is warm, dry and intact. No rash noted. Psychiatric: Mood and affect are normal. Speech and behavior are normal. Patient exhibits appropriate insight and judgement.   ____________________________________________   LABS (all labs ordered are listed, but only abnormal results are displayed)  Labs Reviewed - No data to display ____________________________________________  EKG   ____________________________________________  RADIOLOGY I personally viewed and evaluated these images as part of my medical decision making, as well as reviewing the written report by the radiologist.  I concur with radiologist finding of no acute osseous abnormality to the lumbar spine  Dg Lumbar Spine Complete  Result Date: 08/21/2018 CLINICAL DATA:  Pain after slip and fall 30 minutes ago. EXAM: LUMBAR SPINE - COMPLETE 4+ VIEW COMPARISON:  07/28/2018 FINDINGS: There is no evidence of lumbar spine fracture. Alignment is normal. Intervertebral disc spaces are maintained. IMPRESSION: Negative. Electronically Signed   By: Tollie Eth M.D.   On: 08/21/2018 18:32    ____________________________________________  PROCEDURES  Procedure(s) performed:    Procedures    Medications  HYDROcodone-acetaminophen (NORCO/VICODIN) 5-325 MG per tablet 1 tablet (has no administration in time range)  ondansetron (ZOFRAN-ODT) disintegrating tablet 8 mg (has no administration in time range)     ____________________________________________   INITIAL IMPRESSION / ASSESSMENT AND PLAN / ED COURSE  Pertinent labs & imaging results that were available during my care of the patient were reviewed by me and considered in my medical decision making (see chart for details).  Review of the  [place] was performed in accordance of the [facility] prior to dispensing any controlled drugs.      Patient's diagnosis is consistent with fall resulting in acute on chronic mid low back pain with right-sided sciatica.  Patient  presented after falling when getting out of the shower.  She reported lower back pain radiating into the right leg.  Patient does have a history of chronic low back pain with right-sided sciatica.  Differential was worsening of chronic pain and sciatica, compression fracture, contusion.  X-ray reveals no acute osseous abnormality.  Exam is otherwise reassuring.. Patient will be discharged home with prescriptions for meloxicam and Robaxin. Patient is to follow up with primary care as needed or otherwise directed. Patient is given ED precautions to return to the ED for any worsening or new symptoms.     ____________________________________________  FINAL CLINICAL IMPRESSION(S) / ED DIAGNOSES  Final diagnoses:  Fall, initial encounter  Chronic midline low back pain with right-sided sciatica      NEW MEDICATIONS STARTED DURING THIS VISIT:  ED Discharge Orders         Ordered    meloxicam (MOBIC) 15 MG tablet  Daily     08/21/18 1927    methocarbamol (ROBAXIN) 500 MG tablet  4 times daily     08/21/18 1927              This chart was dictated using voice recognition software/Dragon. Despite best efforts to proofread, errors can occur which can change the meaning. Any change was purely unintentional.    Racheal Patches, PA-C 08/21/18 1929    Minna Antis, MD 08/21/18 [phone]

## 2018-08-21 NOTE — ED Notes (Signed)
See triage note  Presents with back and right leg pain  States she fell from shower PTA

## 2018-08-31 ENCOUNTER — Ambulatory Visit: Payer: Self-pay | Admitting: Physician Assistant

## 2018-09-16 ENCOUNTER — Other Ambulatory Visit: Payer: Self-pay

## 2018-09-16 DIAGNOSIS — F17210 Nicotine dependence, cigarettes, uncomplicated: Secondary | ICD-10-CM | POA: Insufficient documentation

## 2018-09-16 DIAGNOSIS — J40 Bronchitis, not specified as acute or chronic: Secondary | ICD-10-CM | POA: Diagnosis not present

## 2018-09-16 DIAGNOSIS — I10 Essential (primary) hypertension: Secondary | ICD-10-CM | POA: Diagnosis not present

## 2018-09-16 DIAGNOSIS — E0500 Thyrotoxicosis with diffuse goiter without thyrotoxic crisis or storm: Secondary | ICD-10-CM | POA: Insufficient documentation

## 2018-09-16 DIAGNOSIS — H9202 Otalgia, left ear: Secondary | ICD-10-CM | POA: Diagnosis not present

## 2018-09-16 DIAGNOSIS — Z79899 Other long term (current) drug therapy: Secondary | ICD-10-CM | POA: Diagnosis not present

## 2018-09-16 DIAGNOSIS — R05 Cough: Secondary | ICD-10-CM | POA: Diagnosis present

## 2018-09-16 LAB — BLOOD GAS, VENOUS
ACID-BASE EXCESS: 1.7 mmol/L (ref 0.0–2.0)
BICARBONATE: 26.6 mmol/L (ref 20.0–28.0)
O2 Saturation: 90.4 %
PCO2 VEN: 42 mmHg — AB (ref 44.0–60.0)
PH VEN: 7.41 (ref 7.250–7.430)
Patient temperature: 37
pO2, Ven: 59 mmHg — ABNORMAL HIGH (ref 32.0–45.0)

## 2018-09-16 LAB — BASIC METABOLIC PANEL
Anion gap: 7 (ref 5–15)
BUN: 11 mg/dL (ref 6–20)
CALCIUM: 8.6 mg/dL — AB (ref 8.9–10.3)
CO2: 25 mmol/L (ref 22–32)
CREATININE: 0.53 mg/dL (ref 0.44–1.00)
Chloride: 107 mmol/L (ref 98–111)
GFR calc Af Amer: >60 mL/min (ref >60)
GLUCOSE: 126 mg/dL — AB (ref 70–99)
Potassium: 3.4 mmol/L — ABNORMAL LOW (ref 3.5–5.1)
SODIUM: 139 mmol/L (ref 135–145)

## 2018-09-16 LAB — CBC
HCT: 36.5 % (ref 36.0–46.0)
HEMOGLOBIN: 11.5 g/dL — AB (ref 12.0–15.0)
MCH: 26.9 pg (ref 26.0–34.0)
MCHC: 31.5 g/dL (ref 30.0–36.0)
MCV: 85.3 fL (ref 80.0–100.0)
NRBC: 0 % (ref 0.0–0.2)
Platelets: 221 10*3/uL (ref 150–400)
RBC: 4.28 MIL/uL (ref 3.87–5.11)
RDW: 12.8 % (ref 11.5–15.5)
WBC: 6.9 10*3/uL (ref 4.0–10.5)

## 2018-09-16 NOTE — ED Triage Notes (Addendum)
Pt comes via POV from home with c/o cough and sore throat. Pt states this started this morning. Pt denies N/V/D. Pt also reports she thinks it may have been blood that she was coughing up.  Pt reports being drowsy. Pt falling asleep in triage. Pt states she is always really tired and could sleep all day.

## 2018-09-16 NOTE — ED Notes (Signed)
Pt presents with c/o sore throat and feeling drowsy; ambulatory with steady gait; talking in complete coherent sentences

## 2018-09-17 ENCOUNTER — Emergency Department
Admission: EM | Admit: 2018-09-17 | Discharge: 2018-09-17 | Disposition: A | Discharge status: Home / Self Care | Payer: Medicaid Other | Attending: Emergency Medicine | Admitting: Emergency Medicine

## 2018-09-17 ENCOUNTER — Emergency Department: Payer: Medicaid Other

## 2018-09-17 DIAGNOSIS — J208 Acute bronchitis due to other specified organisms: Secondary | ICD-10-CM

## 2018-09-17 DIAGNOSIS — H9202 Otalgia, left ear: Secondary | ICD-10-CM

## 2018-09-17 MED ORDER — LIDOCAINE HCL (PF) 1 % IJ SOLN
5.0000 mL | Freq: Once | INTRAMUSCULAR | Status: AC
  Administered 2018-09-17: 5 mL via INTRADERMAL
  Filled 2018-09-17: qty 5

## 2018-09-17 MED ORDER — IBUPROFEN 600 MG PO TABS
600.0000 mg | ORAL_TABLET | Freq: Once | ORAL | Status: AC
  Administered 2018-09-17: 600 mg via ORAL
  Filled 2018-09-17: qty 1

## 2018-09-17 MED ORDER — BENZONATATE 200 MG PO CAPS: 200.0000 mg | ORAL_CAPSULE | Freq: Four times a day (QID) | ORAL | 0 refills | Status: DC | PRN

## 2018-09-17 MED ORDER — IBUPROFEN 600 MG PO TABS: 600.0000 mg | ORAL_TABLET | Freq: Three times a day (TID) | ORAL | 0 refills | Status: DC | PRN

## 2018-09-17 MED ORDER — ALBUTEROL SULFATE (2.5 MG/3ML) 0.083% IN NEBU
5.0000 mg | INHALATION_SOLUTION | Freq: Once | RESPIRATORY_TRACT | Status: AC
Start: 2018-09-17 — End: 2018-09-17
  Administered 2018-09-17: 5 mg via RESPIRATORY_TRACT
  Filled 2018-09-17: qty 6

## 2018-09-17 MED ORDER — OXYMETAZOLINE HCL 0.05 % NA SOLN
1.0000 | Freq: Once | NASAL | Status: AC
  Administered 2018-09-17: 1 via NASAL
  Filled 2018-09-17: qty 15

## 2018-09-17 MED ORDER — ALBUTEROL SULFATE HFA 108 (90 BASE) MCG/ACT IN AERS: 2.0000 | INHALATION_SPRAY | Freq: Four times a day (QID) | RESPIRATORY_TRACT | 0 refills | Status: DC | PRN

## 2018-09-17 NOTE — Discharge Instructions (Signed)
Fortunately today your blood work and your chest x-ray were reassuring.  You appear to have a viral infection in your lungs called bronchitis that typically lasts for 5 days.  Please take ibuprofen as needed for pain and chills and use your inhaler for cough and shortness of breath.  Follow-up with your primary care physician this week for recheck and return to the emergency department for any concerns.  It was a pleasure to take care of you today, and thank you for coming to our emergency department.  If you have any questions or concerns before leaving please ask the nurse to grab me and I'm more than happy to go through your aftercare instructions again.  If you were prescribed any opioid pain medication today such as Norco, Vicodin, Percocet, morphine, hydrocodone, or oxycodone please make sure you do not drive when you are taking this medication as it can alter your ability to drive safely.  If you have any concerns once you are home that you are not improving or are in fact getting worse before you can make it to your follow-up appointment, please do not hesitate to call [phone] and come back for further evaluation.  Merrily Brittle, MD  Results for orders placed or performed during the hospital encounter of 09/17/18  Blood gas, venous  Result Value Ref Range   pH, Ven 7.41 7.250 - 7.430   pCO2, Ven 42 (L) 44.0 - 60.0 mmHg   pO2, Ven 59.0 (H) 32.0 - 45.0 mmHg   Bicarbonate 26.6 20.0 - 28.0 mmol/L   Acid-Base Excess 1.7 0.0 - 2.0 mmol/L   O2 Saturation 90.4 %   Patient temperature 37.0    Collection site LINE    Sample type VENOUS   CBC  Result Value Ref Range   WBC 6.9 4.0 - 10.5 K/uL   RBC 4.28 3.87 - 5.11 MIL/uL   Hemoglobin 11.5 (L) 12.0 - 15.0 g/dL   HCT 16.1 09.6 - 04.5 %   MCV 85.3 80.0 - 100.0 fL   MCH 26.9 26.0 - 34.0 pg   MCHC 31.5 30.0 - 36.0 g/dL   RDW 40.9 81.1 - 91.4 %   Platelets 221 150 - 400 K/uL   nRBC 0.0 0.0 - 0.2 %  Basic metabolic panel  Result Value Ref Range    Sodium 139 135 - 145 mmol/L   Potassium 3.4 (L) 3.5 - 5.1 mmol/L   Chloride 107 98 - 111 mmol/L   CO2 25 22 - 32 mmol/L   Glucose, Bld 126 (H) 70 - 99 mg/dL   BUN 11 6 - 20 mg/dL   Creatinine, Ser 7.82 0.44 - 1.00 mg/dL   Calcium 8.6 (L) 8.9 - 10.3 mg/dL   GFR calc non Af Amer >60 >60 mL/min   GFR calc Af Amer >60 >60 mL/min   Anion gap 7 5 - 15   Dg Chest 1 View  Result Date: 09/17/2018 CLINICAL DATA:  Hemoptysis and SOB EXAM: CHEST  1 VIEW COMPARISON:  06/28/2018 FINDINGS: The heart size and mediastinal contours are within normal limits. Both lungs are clear. The visualized skeletal structures are unremarkable. IMPRESSION: No active disease. Electronically Signed   By: Signa Kell M.D.   On: 09/17/2018 02:32   Dg Lumbar Spine Complete  Result Date: 08/21/2018 CLINICAL DATA:  Pain after slip and fall 30 minutes ago. EXAM: LUMBAR SPINE - COMPLETE 4+ VIEW COMPARISON:  07/28/2018 FINDINGS: There is no evidence of lumbar spine fracture. Alignment is normal. Intervertebral disc  spaces are maintained. IMPRESSION: Negative. Electronically Signed   By: Tollie Eth M.D.   On: 08/21/2018 18:32

## 2018-09-17 NOTE — ED Provider Notes (Signed)
[facility] Emergency Department Provider Note  ____________________________________________   First MD Initiated Contact with Patient 09/17/18 0139     (approximate)  I have reviewed the triage vital signs and the nursing notes.   HISTORY  Chief Complaint Sore Throat and Cough   HPI Kimberly Nolan is a 32 y.o. female who self presents to the emergency department with cough and sore throat along with left ear pain that began insidiously earlier this morning.  Symptoms have been slowly progressive are now moderate severity.  No fevers or chills.  She has noted a small amount of blood when she coughs.  She does have sharp upper chest pain worse from coughing improved when not.  Her left ear has throbbing aching discomfort that she describes as "fullness".  No discharge.  No trauma.  No sick contacts.  She does have a past medical history of hypertension obesity and Graves' disease.  She is a smoker.    Past Medical History:  Diagnosis Date  . Essential hypertension   . Graves disease   . Morbid obesity (HCC)   . Smoker     Patient Active Problem List   Diagnosis Date Noted  . 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 Thyroiditis 02/17/2017  . Goiter 02/17/2017  . Hyperglycemia 02/17/2017  . Prediabetes 02/17/2017  . Morbid obesity (HCC) 02/17/2017  . Hyperthyroidism 02/16/2017  . Graves disease 02/16/2017  . Essential hypertension 02/16/2017  . Current every day smoker 02/27/2015  . Morbid obesity with BMI of 50.0-59.9, adult (HCC) 02/27/2015  . Shortness of breath 09/13/2012  . CAP (community acquired pneumonia) 09/13/2012  . 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 Leukocytosis 09/13/2012  . Palpitations 09/13/2012    Past Surgical History:  Procedure Laterality Date  . CESAREAN SECTION      Prior to Admission medications   Medication Sig Start Date End Date Taking? Authorizing Provider  albuterol (PROVENTIL HFA;VENTOLIN HFA) 108 (90 Base) MCG/ACT inhaler  Inhale 2 puffs into the lungs every 6 (six) hours as needed for wheezing or shortness of breath. 09/17/18   Merrily Brittle, MD  benzonatate (TESSALON) 200 MG capsule Take 1 capsule (200 mg total) by mouth every 6 (six) hours as needed for cough. 09/17/18 09/17/19  Merrily Brittle, MD  brompheniramine-pseudoephedrine-DM 30-2-10 MG/5ML syrup Take 5 mLs by mouth 4 (four) times daily as needed. 12/03/17   Tommi Rumps, PA-C  fluticasone (FLONASE) 50 MCG/ACT nasal spray Place 1 spray into both nostrils daily for 10 days. 12/04/17 12/14/17  Rebecka Apley, MD  HYDROcodone-acetaminophen (NORCO) 5-325 MG tablet Take 1 tablet by mouth every 6 (six) hours as needed for up to 7 doses for severe pain. 03/27/18   Merrily Brittle, MD  ibuprofen (ADVIL,MOTRIN) 600 MG tablet Take 1 tablet (600 mg total) by mouth every 8 (eight) hours as needed. 09/17/18   Merrily Brittle, MD  meloxicam (MOBIC) 15 MG tablet Take 1 tablet (15 mg total) by mouth daily. 08/21/18   Cuthriell, Delorise Royals, PA-C  methimazole (TAPAZOLE) 10 MG tablet Take 2 tablets (20 mg total) by mouth 2 (two) times daily. 04/28/18   Loleta Rose, MD  methocarbamol (ROBAXIN) 500 MG tablet Take 1 tablet (500 mg total) by mouth 4 (four) times daily. 08/21/18   Cuthriell, Delorise Royals, PA-C  pseudoephedrine (SUDAFED) 30 MG tablet Take 1 tablet (30 mg total) by mouth every 6 (six) hours as needed for congestion. 12/04/17 12/04/18  Rebecka Apley, MD  traMADol (ULTRAM) 50 MG tablet Take 1  tablet (50 mg total) by mouth every 6 (six) hours as needed. 12/04/17   Rebecka Apley, MD    Allergies Penicillins  Family History  Problem Relation Age of Onset  . Thyroid disease Mother   . Hypertension Father   . Stroke Father   . Diabetes Brother   . Hypertension Brother     Social History Social History   Tobacco Use  . Smoking status: Current Every Day Smoker    Packs/day: 0.50    Years: 3.00    Pack years: 1.50    Types: Cigarettes  . Smokeless  tobacco: Never Used  Substance Use Topics  . Alcohol use: Yes    Comment: Occasionally  . Drug use: No    Review of Systems Constitutional: No fever/chills Eyes: No visual changes. ENT: Positive for otalgia Cardiovascular: Positive for chest pain. Respiratory: Positive for shortness of breath. Gastrointestinal: No abdominal pain.  No nausea, no vomiting.  No diarrhea.  No constipation. Genitourinary: Negative for dysuria. Musculoskeletal: Negative for back pain. Skin: Negative for rash. Neurological: Negative for headaches, focal weakness or numbness.   ____________________________________________   PHYSICAL EXAM:  VITAL SIGNS: ED Triage Vitals [09/16/18 2205]  Enc Vitals Group     BP (!) 151/82     Pulse Rate 96     Resp 18     Temp 98.4 F (36.9 C)     Temp src      SpO2 100 %     Weight (!) 330 lb (149.7 kg)     Height 5\' 7"  (1.702 m)     Head Circumference      Peak Flow      Pain Score 0     Pain Loc      Pain Edu?      Excl. in GC?     Constitutional: Alert and oriented x4 appears somewhat tired although nontoxic no diaphoresis Eyes: PERRL EOMI. Head: Atraumatic.  Normal tympanic membrane on the right left tympanic membrane with fluid behind it although no erythema no bulging Nose: Positive for congestion Mouth/Throat: No trismus uvula midline no pharyngeal erythema or exudate Neck: No stridor.   Cardiovascular: Normal rate, regular rhythm. Grossly normal heart sounds.  Good peripheral circulation. Respiratory: Normal respiratory effort.  No retractions.  Mild expiratory wheeze throughout all the lung sounds are equal bilaterally Gastrointestinal: Soft nontender Musculoskeletal: No lower extremity edema   Neurologic:  Normal speech and language. No gross focal neurologic deficits are appreciated. Skin:  Skin is warm, dry and intact. No rash noted. Psychiatric: Mood and affect are normal. Speech and behavior are  normal.    ____________________________________________   DIFFERENTIAL includes but not limited to  Bronchitis, pneumonia, otitis media, upper respiratory tract infection, COPD ____________________________________________   LABS (all labs ordered are listed, but only abnormal results are displayed)  Labs Reviewed  BLOOD GAS, VENOUS - Abnormal; Notable for the following components:      Result Value   pCO2, Ven 42 (*)    pO2, Ven 59.0 (*)    All other components within normal limits  CBC - Abnormal; Notable for the following components:   Hemoglobin 11.5 (*)    All other components within normal limits  BASIC METABOLIC PANEL - Abnormal; Notable for the following components:   Potassium 3.4 (*)    Glucose, Bld 126 (*)    Calcium 8.6 (*)    All other components within normal limits    Lab work reviewed by me with slightly  increased CO2 otherwise unremarkable __________________________________________  EKG   ____________________________________________  RADIOLOGY  Chest x-ray reviewed by me with no acute disease ____________________________________________   PROCEDURES  Procedure(s) performed: no  Procedures  Critical Care performed: no  ____________________________________________   INITIAL IMPRESSION / ASSESSMENT AND PLAN / ED COURSE  Pertinent labs & imaging results that were available during my care of the patient were reviewed by me and considered in my medical decision making (see chart for details).   As part of my medical decision making, I reviewed the following data within the electronic MEDICAL RECORD NUMBER History obtained from family if available, nursing notes, old chart and ekg, as well as notes from prior ED visits.  Patient comes the emergency department with 24 hours of nonproductive cough otalgia sore throat.  Her exam is unremarkable aside from diffuse wheezing and some fluid behind her left ear.  Her constellation of symptoms is most  consistent with viral bronchitis.  Chest x-ray obtained which shows no consolidation.  I nebulized albuterol mixed with lidocaine for her cough gave ibuprofen and Afrin with significant improvement of her symptoms.  I will prescribe albuterol along with ibuprofen Tessalon Perles for home.  No indication for antibiotics.  We discussed the predicted clinical course of disease.  Strict return precautions have been given.      ____________________________________________   FINAL CLINICAL IMPRESSION(S) / ED DIAGNOSES  Final diagnoses:  Viral bronchitis  Otalgia of left ear      NEW MEDICATIONS STARTED DURING THIS VISIT:  Discharge Medication List as of 09/17/2018  3:19 AM    START taking these medications   Details  albuterol (PROVENTIL HFA;VENTOLIN HFA) 108 (90 Base) MCG/ACT inhaler Inhale 2 puffs into the lungs every 6 (six) hours as needed for wheezing or shortness of breath., Starting Sun 09/17/2018, Print    benzonatate (TESSALON) 200 MG capsule Take 1 capsule (200 mg total) by mouth every 6 (six) hours as needed for cough., Starting Sun 09/17/2018, Until Mon 09/17/2019, Print    ibuprofen (ADVIL,MOTRIN) 600 MG tablet Take 1 tablet (600 mg total) by mouth every 8 (eight) hours as needed., Starting Sun 09/17/2018, Print         Note:  This document was prepared using Dragon voice recognition software and may include unintentional dictation errors.     Merrily Brittle, MD 09/20/18 [phone]

## 2018-11-19 IMAGING — CR DG CHEST 2V
1 series · 2 of 2 positions shown · non-contrast
Comparison: Most recent radiograph 09/13/2017. Most recent CT
09/13/2012

CLINICAL DATA: Right-sided chest pain.  Shortness of breath.

EXAM:
CHEST  2 VIEW

[Series 1: dg chest 2 view · 0.14mm/px · 2 of 2 slices shown]
[im 1/2]
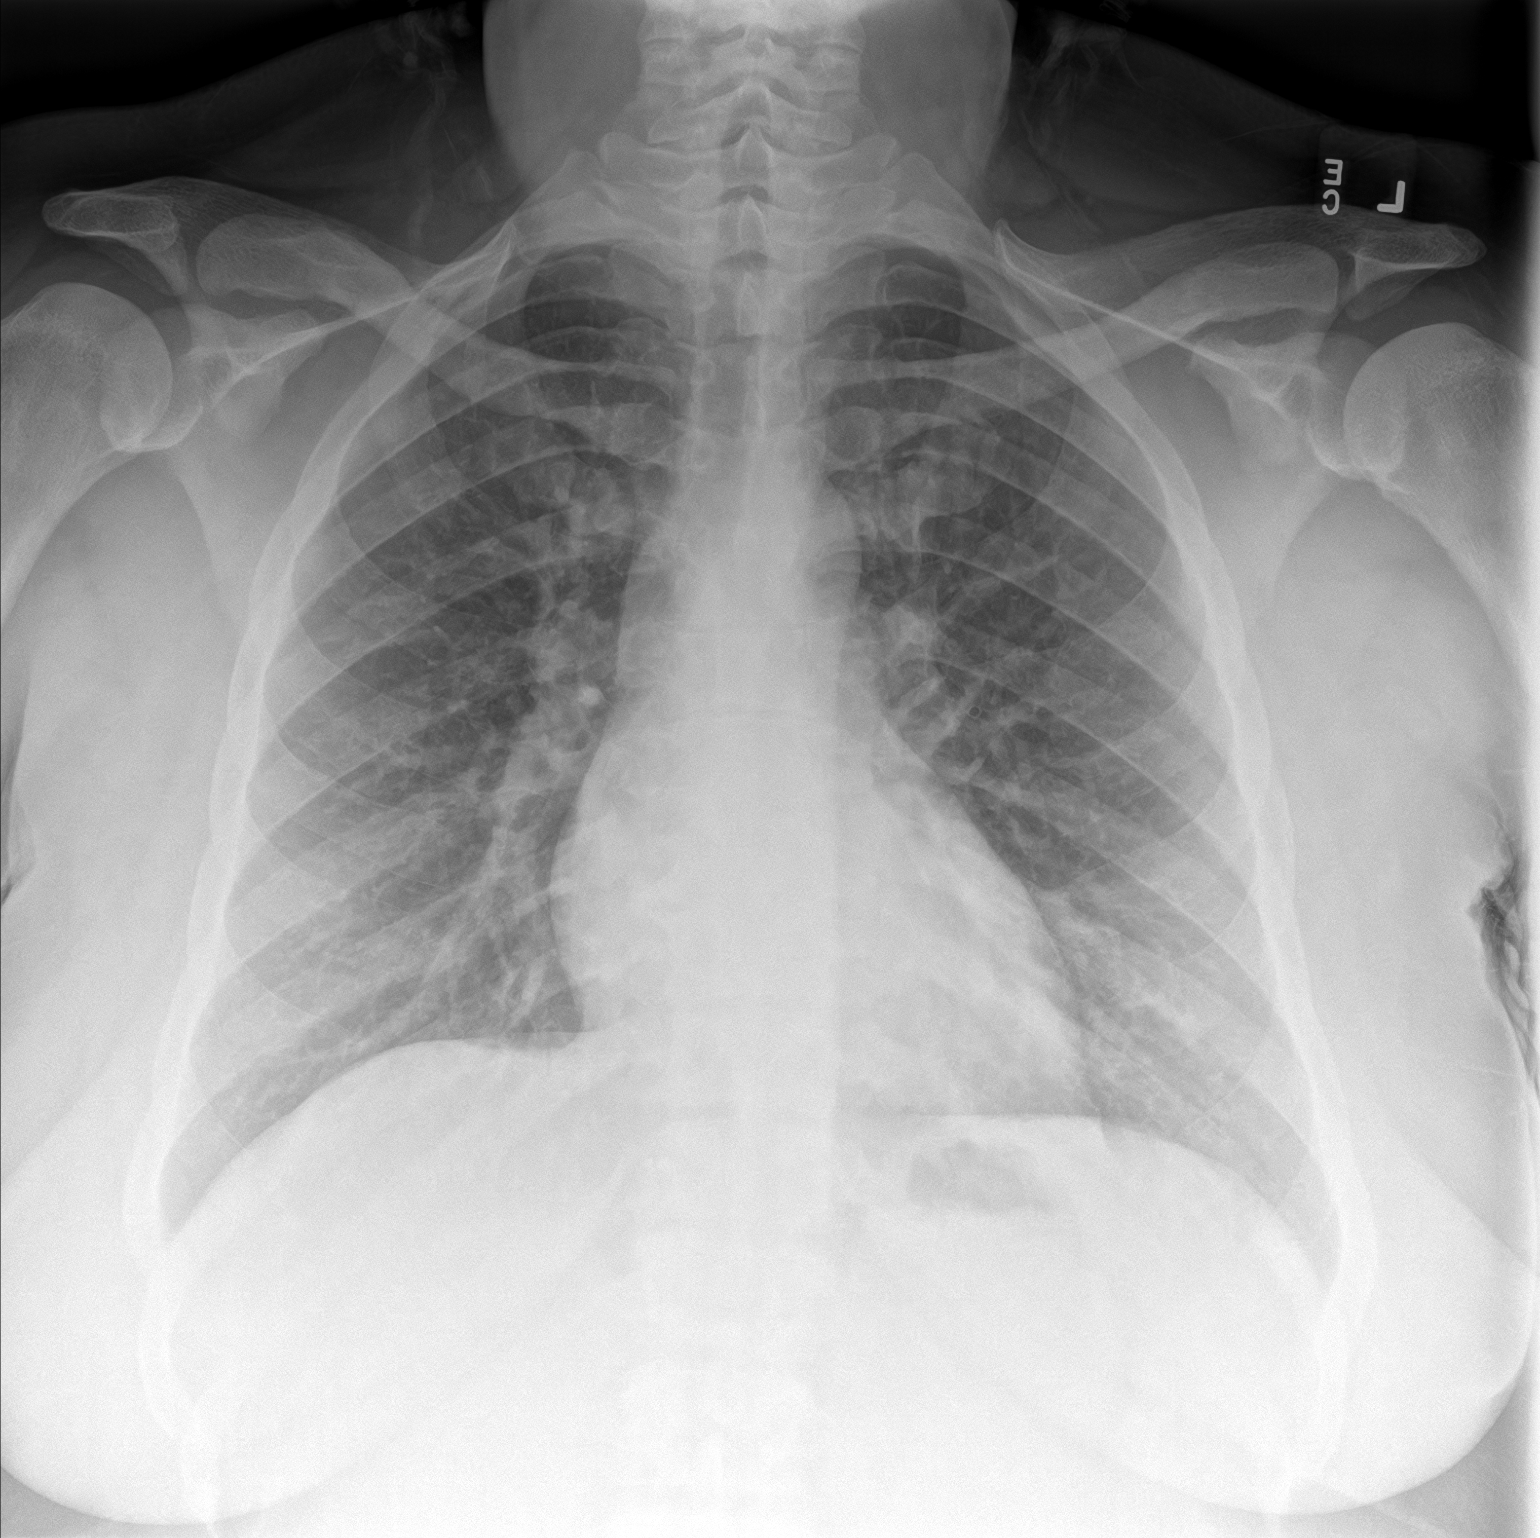
[im 2/2]
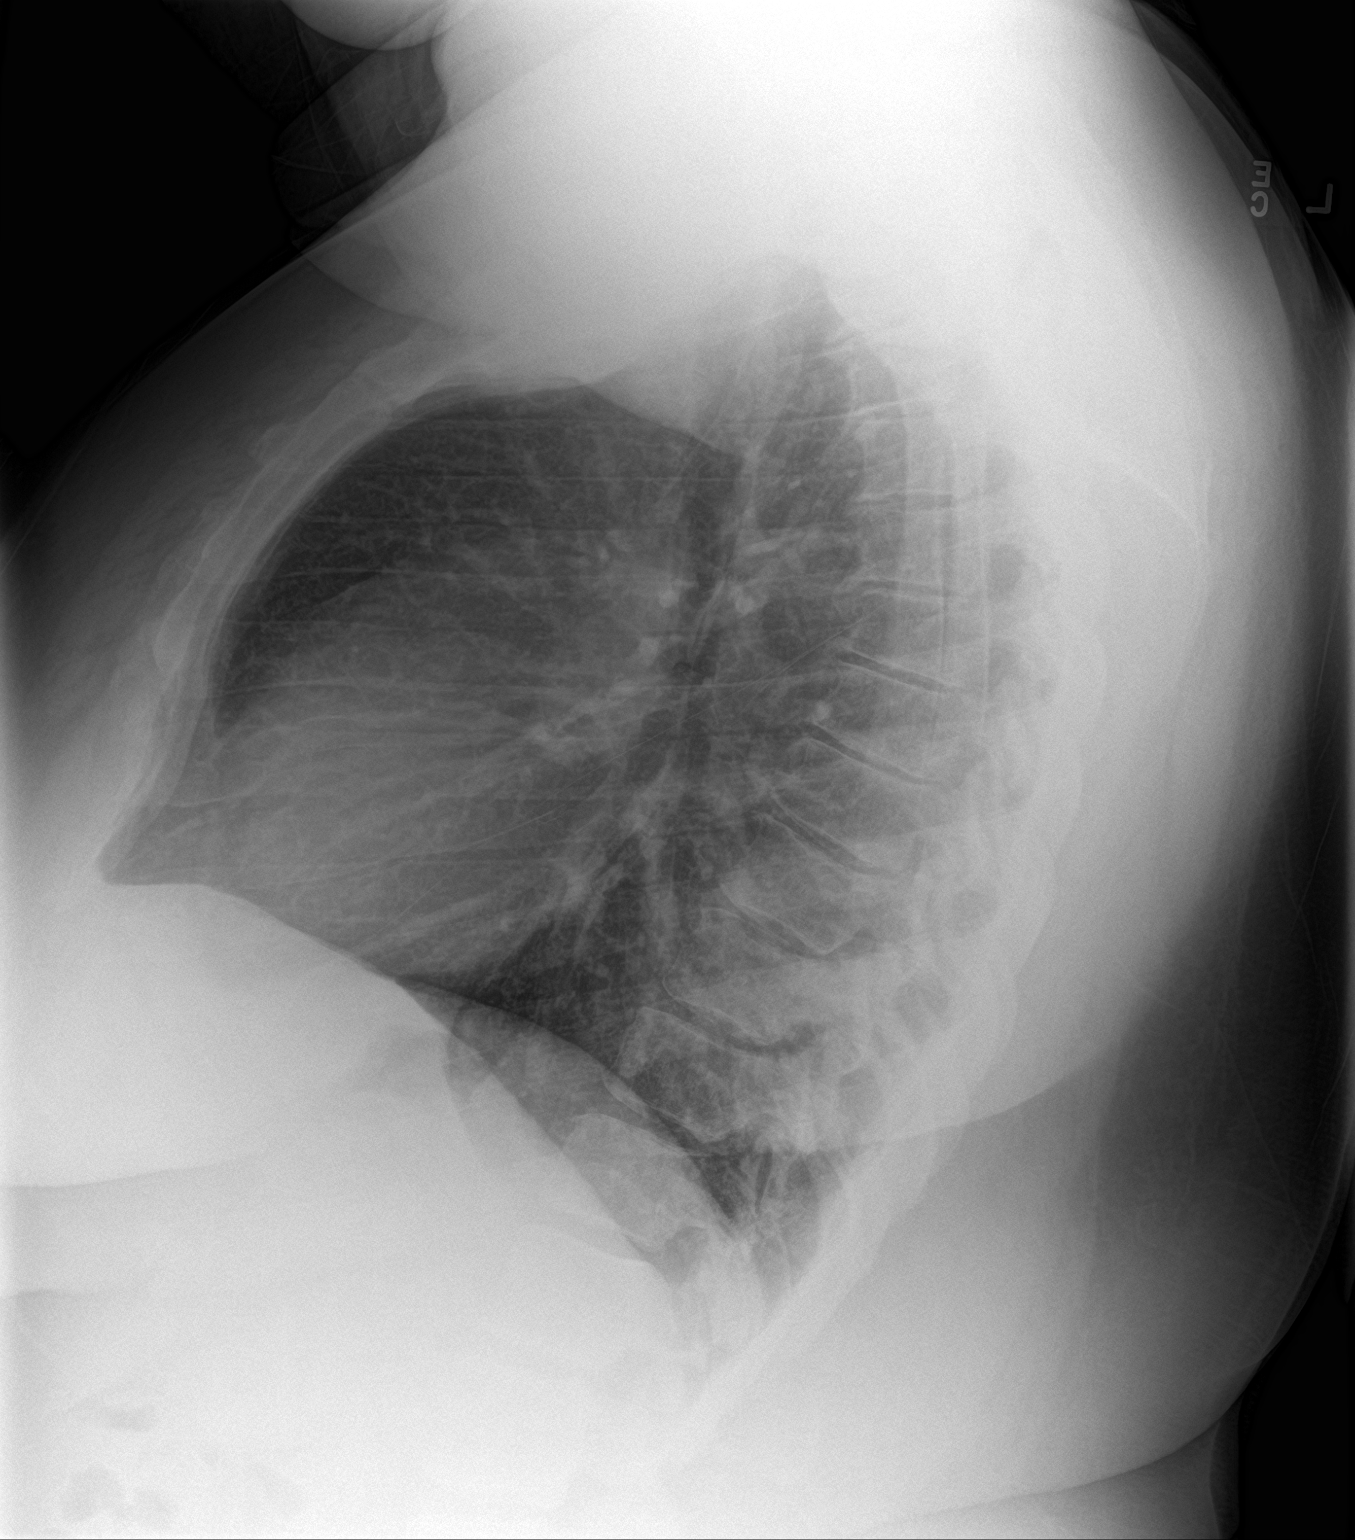

[2 of 2 positions shown; findings below may reference images not displayed]

FINDINGS: The cardiomediastinal contours are normal. The lungs are clear.
Pulmonary vasculature is normal. No consolidation, pleural effusion,
or pneumothorax. No acute osseous abnormalities are seen.
IMPRESSION: No acute pulmonary process.

## 2018-12-17 ENCOUNTER — Emergency Department
Admission: EM | Admit: 2018-12-17 | Discharge: 2018-12-17 | Disposition: A | Discharge status: Home / Self Care | Payer: Medicaid Other | Attending: Emergency Medicine | Admitting: Emergency Medicine

## 2018-12-17 ENCOUNTER — Other Ambulatory Visit: Payer: Self-pay

## 2018-12-17 ENCOUNTER — Encounter: Payer: Self-pay | Admitting: Emergency Medicine

## 2018-12-17 DIAGNOSIS — J111 Influenza due to unidentified influenza virus with other respiratory manifestations: Secondary | ICD-10-CM | POA: Diagnosis not present

## 2018-12-17 DIAGNOSIS — R69 Illness, unspecified: Secondary | ICD-10-CM

## 2018-12-17 DIAGNOSIS — F17210 Nicotine dependence, cigarettes, uncomplicated: Secondary | ICD-10-CM | POA: Insufficient documentation

## 2018-12-17 DIAGNOSIS — I10 Essential (primary) hypertension: Secondary | ICD-10-CM | POA: Insufficient documentation

## 2018-12-17 DIAGNOSIS — Z79899 Other long term (current) drug therapy: Secondary | ICD-10-CM | POA: Diagnosis not present

## 2018-12-17 DIAGNOSIS — R05 Cough: Secondary | ICD-10-CM | POA: Diagnosis present

## 2018-12-17 MED ORDER — OSELTAMIVIR PHOSPHATE 75 MG PO CAPS: 75.0000 mg | ORAL_CAPSULE | Freq: Two times a day (BID) | ORAL | 0 refills | Status: AC

## 2018-12-17 NOTE — ED Triage Notes (Signed)
Pt to ED via POV c/o cough and facial pain x 2 days. Pt is in NAD at this time.

## 2018-12-17 NOTE — ED Notes (Signed)
AAOx3.  Skin warm and dry.  NAD 

## 2018-12-17 NOTE — ED Provider Notes (Signed)
Emergency Department Provider Note  ____________________________________________  Time seen: Approximately 5:56 PM  I have reviewed the triage vital signs and the nursing notes.   HISTORY  Chief Complaint Cough and Facial Pain    HPI Kimberly Nolan is a 33 y.o. female presents to the emergency department with rhinorrhea, congestion, nonproductive cough, bodyaches and chills for the past 2 days.  No emesis or diarrhea.  Patient has been tolerating fluids by mouth and her own secretions.  No chest pain, chest tightness, shortness of breath or abdominal pain.   Past Medical History:  Diagnosis Date  . Essential hypertension   . Graves disease   . Morbid obesity (HCC)   . Smoker     Patient Active Problem List   Diagnosis Date Noted  . 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 Thyroiditis 02/17/2017  . Goiter 02/17/2017  . Hyperglycemia 02/17/2017  . Prediabetes 02/17/2017  . Morbid obesity (HCC) 02/17/2017  . Hyperthyroidism 02/16/2017  . Graves disease 02/16/2017  . Essential hypertension 02/16/2017  . Current every day smoker 02/27/2015  . Morbid obesity with BMI of 50.0-59.9, adult (HCC) 02/27/2015  . Shortness of breath 09/13/2012  . CAP (community acquired pneumonia) 09/13/2012  . 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 Leukocytosis 09/13/2012  . Palpitations 09/13/2012    Past Surgical History:  Procedure Laterality Date  . CESAREAN SECTION      Prior to Admission medications   Medication Sig Start Date End Date Taking? Authorizing Provider  albuterol (PROVENTIL HFA;VENTOLIN HFA) 108 (90 Base) MCG/ACT inhaler Inhale 2 puffs into the lungs every 6 (six) hours as needed for wheezing or shortness of breath. 09/17/18   Merrily Brittle, MD  benzonatate (TESSALON) 200 MG capsule Take 1 capsule (200 mg total) by mouth every 6 (six) hours as needed for cough. 09/17/18 09/17/19  Merrily Brittle, MD  brompheniramine-pseudoephedrine-DM 30-2-10 MG/5ML syrup Take 5 mLs by mouth 4 (four)  times daily as needed. 12/03/17   Tommi Rumps, PA-C  fluticasone (FLONASE) 50 MCG/ACT nasal spray Place 1 spray into both nostrils daily for 10 days. 12/04/17 12/14/17  Rebecka Apley, MD  HYDROcodone-acetaminophen (NORCO) 5-325 MG tablet Take 1 tablet by mouth every 6 (six) hours as needed for up to 7 doses for severe pain. 03/27/18   Merrily Brittle, MD  ibuprofen (ADVIL,MOTRIN) 600 MG tablet Take 1 tablet (600 mg total) by mouth every 8 (eight) hours as needed. 09/17/18   Merrily Brittle, MD  meloxicam (MOBIC) 15 MG tablet Take 1 tablet (15 mg total) by mouth daily. 08/21/18   Cuthriell, Delorise Royals, PA-C  methimazole (TAPAZOLE) 10 MG tablet Take 2 tablets (20 mg total) by mouth 2 (two) times daily. 04/28/18   Loleta Rose, MD  methocarbamol (ROBAXIN) 500 MG tablet Take 1 tablet (500 mg total) by mouth 4 (four) times daily. 08/21/18   Cuthriell, Delorise Royals, PA-C  oseltamivir (TAMIFLU) 75 MG capsule Take 1 capsule (75 mg total) by mouth 2 (two) times daily for 5 days. 12/17/18 12/22/18  Orvil Feil, PA-C  traMADol (ULTRAM) 50 MG tablet Take 1 tablet (50 mg total) by mouth every 6 (six) hours as needed. 12/04/17   Rebecka Apley, MD    Allergies Penicillins  Family History  Problem Relation Age of Onset  . Thyroid disease Mother   . Hypertension Father   . Stroke Father   . Diabetes Brother   . Hypertension Brother     Social History Social History   Tobacco Use  . Smoking status:  Current Every Day Smoker    Packs/day: 0.50    Years: 3.00    Pack years: 1.50    Types: Cigarettes  . Smokeless tobacco: Never Used  Substance Use Topics  . Alcohol use: Yes    Comment: Occasionally  . Drug use: No      Review of Systems  Constitutional: Patient has fever.  Eyes: No visual changes. No discharge ENT: Patient has congestion.  Cardiovascular: no chest pain. Respiratory: Patient has cough.  Gastrointestinal: No abdominal pain.  No nausea, no vomiting. No diarrhea.   Genitourinary: Negative for dysuria. No hematuria Musculoskeletal: Patient has myalgias.  Skin: Negative for rash, abrasions, lacerations, ecchymosis. Neurological: Patient has headache, no focal weakness or numbness.     ____________________________________________   PHYSICAL EXAM:  VITAL SIGNS: ED Triage Vitals  Enc Vitals Group     BP 12/17/18 1650 (!) 166/86     Pulse Rate 12/17/18 1650 (!) 105     Resp --      Temp 12/17/18 1650 99.4 F (37.4 C)     Temp Source 12/17/18 1650 Oral     SpO2 12/17/18 1650 93 %     Weight 12/17/18 1650 (!) 325 lb (147.4 kg)     Height 12/17/18 1650 5\' 7"  (1.702 m)     Head Circumference --      Peak Flow --      Pain Score 12/17/18 1658 8     Pain Loc --      Pain Edu? --      Excl. in GC? --      Constitutional: Alert and oriented. Patient is lying supine. Eyes: Conjunctivae are normal. PERRL. EOMI. Head: Atraumatic. ENT:      Ears: Tympanic membranes are mildly injected with mild effusion bilaterally.       Nose: No congestion/rhinnorhea.      Mouth/Throat: Mucous membranes are moist. Posterior pharynx is mildly erythematous.  Hematological/Lymphatic/Immunilogical: No cervical lymphadenopathy.  Cardiovascular: Normal rate, regular rhythm. Normal S1 and S2.  Good peripheral circulation. Respiratory: Normal respiratory effort without tachypnea or retractions. Lungs CTAB. Good air entry to the bases with no decreased or absent breath sounds. Gastrointestinal: Bowel sounds 4 quadrants. Soft and nontender to palpation. No guarding or rigidity. No palpable masses. No distention. No CVA tenderness. Musculoskeletal: Full range of motion to all extremities. No gross deformities appreciated. Neurologic:  Normal speech and language. No gross focal neurologic deficits are appreciated.  Skin:  Skin is warm, dry and intact. No rash noted. Psychiatric: Mood and affect are normal. Speech and behavior are normal. Patient exhibits appropriate  insight and judgement.    ____________________________________________   LABS (all labs ordered are listed, but only abnormal results are displayed)  Labs Reviewed - No data to display ____________________________________________  EKG   ____________________________________________  RADIOLOGY   No results found.  ____________________________________________    PROCEDURES  Procedure(s) performed:    Procedures    Medications - No data to display   ____________________________________________   INITIAL IMPRESSION / ASSESSMENT AND PLAN / ED COURSE  Pertinent labs & imaging results that were available during my care of the patient were reviewed by me and considered in my medical decision making (see chart for details).  Review of the [place] CSRS was performed in accordance of the [facility] prior to dispensing any controlled drugs.    Assessment and Plan:  Influenza-like illness Patient presents to the emergency department with headache, rhinorrhea, congestion and nonproductive cough for the past 2  days.  History and physical exam findings are consistent with influenza.  Patient elected to be treated empirically with Tamiflu.  Rest hydration were encouraged at home.  Tylenol and ibuprofen alternating for fever were recommended.  Patient was advised to follow-up with primary care as needed.     ____________________________________________  FINAL CLINICAL IMPRESSION(S) / ED DIAGNOSES  Final diagnoses:  Influenza-like illness      NEW MEDICATIONS STARTED DURING THIS VISIT:  ED Discharge Orders         Ordered    oseltamivir (TAMIFLU) 75 MG capsule  2 times daily     12/17/18 1740              This chart was dictated using voice recognition software/Dragon. Despite best efforts to proofread, errors can occur which can change the meaning. Any change was purely unintentional.    Orvil Feil, PA-C 12/17/18 1802    Jeanmarie Plant, MD 12/18/18  [phone]

## 2019-02-20 ENCOUNTER — Telehealth: Payer: Self-pay

## 2019-02-20 ENCOUNTER — Ambulatory Visit (INDEPENDENT_AMBULATORY_CARE_PROVIDER_SITE_OTHER): Payer: Medicaid Other | Admitting: Physician Assistant

## 2019-02-20 DIAGNOSIS — Z9114 Patient's other noncompliance with medication regimen: Secondary | ICD-10-CM | POA: Diagnosis not present

## 2019-02-20 DIAGNOSIS — E0500 Thyrotoxicosis with diffuse goiter without thyrotoxic crisis or storm: Secondary | ICD-10-CM

## 2019-02-20 DIAGNOSIS — R000 Tachycardia, unspecified: Secondary | ICD-10-CM | POA: Diagnosis not present

## 2019-02-20 DIAGNOSIS — Z91148 Patient's other noncompliance with medication regimen for other reason: Secondary | ICD-10-CM

## 2019-02-20 DIAGNOSIS — R030 Elevated blood-pressure reading, without diagnosis of hypertension: Secondary | ICD-10-CM

## 2019-02-20 MED ORDER — METHIMAZOLE 10 MG PO TABS: 10.0000 mg | ORAL_TABLET | Freq: Three times a day (TID) | ORAL | 0 refills | Status: DC

## 2019-02-20 MED ORDER — PROPRANOLOL HCL 20 MG PO TABS: 20.0000 mg | ORAL_TABLET | Freq: Two times a day (BID) | ORAL | 0 refills | Status: DC

## 2019-02-20 NOTE — Progress Notes (Signed)
Subjective:    Patient ID: Kimberly Nolan, female    DOB: 04-Dec-1985, 33 y.o.   MRN: [id number]  Kimberly Nolan is a 33 y.o. female presenting on 02/20/2019 for Hyperthyroidism  Virtual Visit via Video Note  I connected with Mischele K Schmieder on 02/20/19 at 10:40 AM EDT by a video enabled telemedicine application and verified that I am speaking with the correct person using two identifiers.   I discussed the limitations of evaluation and management by telemedicine and the availability of in person appointments. The patient expressed understanding and agreed to proceed.  HPI   Patient is a 33 y/o woman with history of uncontrolled Grave's disease presenting today for the same. She has a history of noncompliance with follow up and medications. Last seen in this clinic as a new patient on 06/2017, referred to endocrinology and then lost to follow up never having established with endocrinology. She was receiving methimazole from Dr. Gershon Crane at [facility] endocrinology but then never attended appointment. She says "something went wrong with insurance" and she couldn't afford it.  Historically, her Grave's disease has required inpatient management and she was discharged on methimazole and propranolol with inconsistent follow up. She was recommended at one point by [facility] to have thyroidectomy. Patient has been using the emergency room for her medical care.   Hyperthyroidism: Currently on no medications. Has some times where she feels like her heart is racing. Does have some times where she feels overheated. Reports she is currently having period, unsure of irregular bleeding. Denies weight loss.  BP Readings from Last 3 Encounters:  12/17/18 (!) 160/94  09/17/18 (!) 146/99  08/21/18 124/82   Pulse Readings from Last 3 Encounters:  12/17/18 98  09/17/18 95  08/21/18 82   Lab Results  Component Value Date   TSH <0.010 (L) 04/27/2018     Social History   Tobacco Use   . Smoking status: Current Every Day Smoker    Packs/day: 0.50    Years: 3.00    Pack years: 1.50    Types: Cigarettes  . Smokeless tobacco: Never Used  Substance Use Topics  . Alcohol use: Yes    Comment: Occasionally  . Drug use: No    Review of Systems Per HPI unless specifically indicated above     Objective:    There were no vitals taken for this visit.  Wt Readings from Last 3 Encounters:  12/17/18 (!) 325 lb (147.4 kg)  09/16/18 (!) 330 lb (149.7 kg)  08/21/18 (!) 330 lb (149.7 kg)    Physical Exam Constitutional:      Appearance: Normal appearance. She is obese.  Neck:     Thyroid: Thyromegaly present.  Pulmonary:     Effort: Pulmonary effort is normal. No respiratory distress.  Neurological:     Mental Status: She is alert. Mental status is at baseline.  Psychiatric:        Mood and Affect: Mood normal.        Behavior: Behavior normal.    Results for orders placed or performed during the hospital encounter of 09/17/18  Blood gas, venous  Result Value Ref Range   pH, Ven 7.41 7.250 - 7.430   pCO2, Ven 42 (L) 44.0 - 60.0 mmHg   pO2, Ven 59.0 (H) 32.0 - 45.0 mmHg   Bicarbonate 26.6 20.0 - 28.0 mmol/L   Acid-Base Excess 1.7 0.0 - 2.0 mmol/L   O2 Saturation 90.4 %   Patient temperature 37.0  Collection site LINE    Sample type VENOUS   CBC  Result Value Ref Range   WBC 6.9 4.0 - 10.5 K/uL   RBC 4.28 3.87 - 5.11 MIL/uL   Hemoglobin 11.5 (L) 12.0 - 15.0 g/dL   HCT 16.136.5 09.636.0 - 04.546.0 %   MCV 85.3 80.0 - 100.0 fL   MCH 26.9 26.0 - 34.0 pg   MCHC 31.5 30.0 - 36.0 g/dL   RDW 40.912.8 81.111.5 - 91.415.5 %   Platelets 221 150 - 400 K/uL   nRBC 0.0 0.0 - 0.2 %  Basic metabolic panel  Result Value Ref Range   Sodium 139 135 - 145 mmol/L   Potassium 3.4 (L) 3.5 - 5.1 mmol/L   Chloride 107 98 - 111 mmol/L   CO2 25 22 - 32 mmol/L   Glucose, Bld 126 (H) 70 - 99 mg/dL   BUN 11 6 - 20 mg/dL   Creatinine, Ser 7.820.53 0.44 - 1.00 mg/dL   Calcium 8.6 (L) 8.9 - 10.3 mg/dL    GFR calc non Af Amer >60 >60 mL/min   GFR calc Af Amer >60 >60 mL/min   Anion gap 7 5 - 15      Assessment & Plan:  1. Graves disease  Extremely uncontrolled. Have referred to endocrinology nearly two years ago and patient never attended appointment due to reported insurance issues. Instead, frequents the ER who repeatedly tells her she needs to follow up outpatient. Last TSH <0.01 in 04/2018. Do not have more recent labs available to me but this level has been largely uncontrolled over two years and I have no reason to think it has improved. Additionally, she is hypertensive and tachycardic on prior recorded Bps and pulses in ER. I will provide 1 month script of methimazole until she gets into endocrinology and also propranolol for symptoms. I have reviewed EKG from 12/2018, no evidence of heart block. I have advised her I do not typically fill methimazole nor do I monitor it and she must be under care of endocrinology. Also advised that since she did not attend [facility] they may refuse referral. Also counseled that referrals may be delayed due to COVID-19. Counseled on return precautions.  - Ambulatory referral to Endocrinology - propranolol (INDERAL) 20 MG tablet; Take 1 tablet (20 mg total) by mouth 2 (two) times daily.  Dispense: 180 tablet; Refill: 0 - methimazole (TAPAZOLE) 10 MG tablet; Take 1 tablet (10 mg total) by mouth 3 (three) times daily for 30 days.  Dispense: 90 tablet; Refill: 0  2. Tachycardia  - propranolol (INDERAL) 20 MG tablet; Take 1 tablet (20 mg total) by mouth 2 (two) times daily.  Dispense: 180 tablet; Refill: 0 - methimazole (TAPAZOLE) 10 MG tablet; Take 1 tablet (10 mg total) by mouth 3 (three) times daily for 30 days.  Dispense: 90 tablet; Refill: 0  3. Elevated BP without diagnosis of hypertension  - propranolol (INDERAL) 20 MG tablet; Take 1 tablet (20 mg total) by mouth 2 (two) times daily.  Dispense: 180 tablet; Refill: 0 - methimazole  (TAPAZOLE) 10 MG tablet; Take 1 tablet (10 mg total) by mouth 3 (three) times daily for 30 days.  Dispense: 90 tablet; Refill: 0    Follow up plan: Return in about 3 months (around 05/22/2019) for CPE.  Osvaldo AngstAdriana Pollak, PA-C [facility]  [facility] 02/20/2019, 11:43 AM

## 2019-02-20 NOTE — Telephone Encounter (Signed)
Patient called and stated she needs a referral to an endocrinology due to her insurance. Patient is aware she has not been in the office since 2018 and she schedule appointment for 02/20/2019 @ 10:40 AM. Lorain Childes

## 2019-03-02 DIAGNOSIS — E0500 Thyrotoxicosis with diffuse goiter without thyrotoxic crisis or storm: Secondary | ICD-10-CM | POA: Insufficient documentation

## 2019-03-02 DIAGNOSIS — F17200 Nicotine dependence, unspecified, uncomplicated: Secondary | ICD-10-CM | POA: Insufficient documentation

## 2019-03-21 ENCOUNTER — Other Ambulatory Visit: Payer: Self-pay

## 2019-03-21 ENCOUNTER — Ambulatory Visit (INDEPENDENT_AMBULATORY_CARE_PROVIDER_SITE_OTHER): Payer: Medicaid Other

## 2019-03-21 ENCOUNTER — Encounter: Payer: Self-pay | Admitting: General Surgery

## 2019-03-21 ENCOUNTER — Ambulatory Visit (INDEPENDENT_AMBULATORY_CARE_PROVIDER_SITE_OTHER): Payer: Medicaid Other | Admitting: General Surgery

## 2019-03-21 VITALS — BP 149/94 | HR 89 | Temp 97.2°F | Ht 67.0 in | Wt 391.0 lb

## 2019-03-21 DIAGNOSIS — E0500 Thyrotoxicosis with diffuse goiter without thyrotoxic crisis or storm: Secondary | ICD-10-CM | POA: Diagnosis not present

## 2019-03-21 NOTE — Patient Instructions (Signed)
Will call you when we can schedule surgery

## 2019-03-21 NOTE — Progress Notes (Signed)
Patient ID: Kimberly DownsChandrike K Nolan, female   DOB: May 07, 1986, 33 y.o.   MRN: [id number]  Chief Complaint  Patient presents with  . Graves' disease    HPI Kimberly Nolan is a 33 y.o. female.   She was referred by Dr. Wendall MolaMelissa Solum for surgical evaluation of Graves' disease.  Kimberly Nolan states that she has had Graves' disease for the past 8 years.  Due to insurance issues, she has been on and off of medical therapy.  She was recently referred back to endocrinology, by her primary care provider Kimberly AngstAdriana Pollak, PA-C.  She is now taking methimazole 10 mg 3 times daily and propranolol, per her report, but I do not have the dosage.  Her most recent labs demonstrate an undetectable TSH, a free T4 elevated at 2.03 (upper limit of normal 1.14), and a normal total T3, though it is near the upper limit of normal.  She has Graves' orbitopathy and as such, it was felt that radioactive iodine ablation would not be the best choice of treatment for her.  As a result, she has been referred for surgical management of her Graves' disease.  Symptom wise, Kimberly Nolan reports both palpitations and tremors, as well as feeling jittery and anxious.  She endorses photophobia and "bulgy eyes".  She states that she occasionally has a gritty sensation in her eyes.  She reports brittle nails and dry hair.  She has frequent diarrhea.  She says that she is very emotional and feels like her moods are quite labile.  She reports a 20 pound weight gain since December.  She also endorses some discoloration of her bilateral lower extremities as well as occasional tingling in this area.  She states that sometimes she notices itching.  She denies dysphagia to either solids or liquids, but sometimes has pill dysphagia.  She endorses an occasional pressure sensation in the neck, but denies frequent throat clearing or dyspnea in the supine position.  She says that her mother had radioactive iodine treatment for thyroid problem in the past, though she is  not certain whether or not this was for Graves' disease or another hyperthyroid issue.  The patient has never had exposure to amiodarone nor has she received any iodinated contrast.  No history of head or neck irradiation.   Past Medical History:  Diagnosis Date  . Essential hypertension   . Graves disease   . Morbid obesity (HCC)   . Smoker     Past Surgical History:  Procedure Laterality Date  . CESAREAN SECTION      Family History  Problem Relation Age of Onset  . Thyroid disease Mother   . Hypertension Father   . Stroke Father   . Diabetes Brother   . Hypertension Brother     Social History Social History   Tobacco Use  . Smoking status: Current Every Day Smoker    Packs/day: 0.50    Years: 3.00    Pack years: 1.50    Types: Cigarettes  . Smokeless tobacco: Never Used  Substance Use Topics  . Alcohol use: Yes    Comment: Occasionally  . Drug use: No    Allergies  Allergen Reactions  . Penicillins Itching    Current Outpatient Medications  Medication Sig Dispense Refill  . albuterol (PROVENTIL HFA;VENTOLIN HFA) 108 (90 Base) MCG/ACT inhaler Inhale 2 puffs into the lungs every 6 (six) hours as needed for wheezing or shortness of breath. 1 Inhaler 0  . benzonatate (TESSALON) 200 MG capsule Take  1 capsule (200 mg total) by mouth every 6 (six) hours as needed for cough. 30 capsule 0  . brompheniramine-pseudoephedrine-DM 30-2-10 MG/5ML syrup Take 5 mLs by mouth 4 (four) times daily as needed. 120 mL 0  . ibuprofen (ADVIL,MOTRIN) 600 MG tablet Take 1 tablet (600 mg total) by mouth every 8 (eight) hours as needed. 30 tablet 0  . methimazole (TAPAZOLE) 10 MG tablet Take 1 tablet (10 mg total) by mouth 3 (three) times daily for 30 days. 90 tablet 0  . methocarbamol (ROBAXIN) 500 MG tablet Take 1 tablet (500 mg total) by mouth 4 (four) times daily. 16 tablet 0  . propranolol (INDERAL) 20 MG tablet Take 1 tablet (20 mg total) by mouth 2 (two) times daily. 180 tablet 0    No current facility-administered medications for this visit.     Review of Systems Review of Systems  Constitutional: Positive for diaphoresis and unexpected weight change.  HENT: Positive for trouble swallowing. Negative for voice change.   Eyes: Positive for photophobia and pain.  Respiratory: Negative.   Cardiovascular: Negative.   Gastrointestinal: Positive for diarrhea.  Endocrine: Negative.   Genitourinary: Negative.   Musculoskeletal: Positive for arthralgias.  Skin: Positive for color change.  Neurological: Negative.   Hematological: Negative.   Psychiatric/Behavioral: The patient is nervous/anxious.     Blood pressure (!) 149/94, pulse 89, temperature (!) 97.2 F (36.2 C), temperature source Skin, height 5\' 7"  (1.702 m), weight (!) 391 lb (177.4 kg), SpO2 98 %.  Physical Exam Physical Exam Constitutional:      General: She is not in acute distress.    Appearance: She is obese. She is not toxic-appearing.     Comments: She is super morbidly obese, with a body mass index of 61.24  HENT:     Head: Normocephalic and atraumatic.     Nose: No congestion or rhinorrhea.     Mouth/Throat:     Mouth: Mucous membranes are moist.  Eyes:     General: No scleral icterus.    Comments: She has proptosis, more noticeable on the left.  She has a right eyelid lag.  She has periorbital edema.  Neck:     Comments: Thyroid is enlarged.  There is no thyroid bruit.  No nodules appreciated.  The gland elevates above the clavicles with deglutition; there is no substernal component present on exam. Cardiovascular:     Rate and Rhythm: Normal rate and regular rhythm.     Pulses: Normal pulses.  Pulmonary:     Effort: Pulmonary effort is normal.     Breath sounds: Normal breath sounds.  Abdominal:     Palpations: Abdomen is soft.     Comments: Obese  Genitourinary:    Comments: Deferred Musculoskeletal:     Comments: She endorses skin discoloration over her shins, but I do not  appreciate this myself.  She is sensitive to pressure in this area, as well.  No changes consistent with Graves' dermatopathy.  Skin:    General: Skin is warm and dry.  Neurological:     General: No focal deficit present.  Psychiatric:        Mood and Affect: Mood normal.        Behavior: Behavior normal.        Thought Content: Thought content normal.     Data Reviewed I reviewed Dr. Pricilla Handler clinic notes, as well as the labs, that are reviewed in the history of present illness.  In addition, Dr. Lynnea Ferrier did check  liver function studies, a complete blood count, and did a pregnancy test.  There were no concerning findings on any of these, just the hyperthyroidism.  Assessment Kimberly Nolan is a 33 year old woman who has had Graves' disease, relatively poorly controlled, for the past 8 years.  She does have eye changes which make radioactive iodine a less desirable option.  She also has a fairly enlarged gland, and may require multiple treatments in order to achieve cure.  She is interested in pursuing surgical removal of her thyroid.  I performed an ultrasound in clinic today.  This demonstrates a diffusely enlarged thyroid gland without any discrete nodules.  There is not significant intra-parenchymal blood flow.  There does not appear to be any displacement of the trachea or of the carotid artery or internal jugular vein.  Plan Due to COVID-19, elective procedures are still being curtailed.  There is a plan for gradual resumption of these cases, but we do not have a goal date at this time.  We will place Kimberly Nolan in the queue and get her scheduled as soon as possible.  I did discuss the risks of the operation with her.  These include, but are not limited to, bleeding, infection, injury to the recurrent laryngeal nerve, postsurgical hypocalcemia, either temporary or permanent, need to take thyroid hormone medication for the rest of her life, and risks related to anesthesia, particularly in light of  her markedly elevated body mass index.  Once we are able to schedule her surgery, we will initiate calcium and vitamin D supplementation for 2 weeks prior to her operation.  This has been demonstrated in a number of studies to decrease the postoperative hungry bone syndrome in patients with poorly controlled Graves' disease.  She will require a preop consultation with anesthesiology, due to her obesity.  She should continue her methimazole and propranolol as prescribed.   Duanne Guess 03/21/2019, 11:53 AM

## 2019-03-30 ENCOUNTER — Telehealth: Payer: Self-pay | Admitting: *Deleted

## 2019-03-30 NOTE — Telephone Encounter (Signed)
Patient message for patient to call us back about surgery . June or July at [facility]

## 2019-04-06 ENCOUNTER — Other Ambulatory Visit: Payer: Self-pay | Admitting: Physician Assistant

## 2019-04-06 DIAGNOSIS — R030 Elevated blood-pressure reading, without diagnosis of hypertension: Secondary | ICD-10-CM

## 2019-04-06 DIAGNOSIS — R000 Tachycardia, unspecified: Secondary | ICD-10-CM

## 2019-04-06 DIAGNOSIS — E0500 Thyrotoxicosis with diffuse goiter without thyrotoxic crisis or storm: Secondary | ICD-10-CM

## 2019-04-11 NOTE — Telephone Encounter (Signed)
Please review. KW 

## 2019-05-12 ENCOUNTER — Telehealth: Payer: Self-pay | Admitting: *Deleted

## 2019-05-12 NOTE — Telephone Encounter (Signed)
Patient contacted today about getting surgery scheduled with Dr. Celine Ahr. Surgery was postponed due to COVID-19.   Patient's surgery to be scheduled for 06-07-19 at [facility] with Dr. Celine Ahr. Dr. Hampton Abbot will be assisting with this case.    The patient is aware to have COVID-19 testing done on 06-04-19.   The patient is aware she will need to Pre-Admit. Patient will check in at the [place] entrance due to COVID-19 restrictions and will then be escorted to the [place], [address] (first floor). Patient will be contacted once Pre-admission appointment has been arranged with date and time. We will try and coordinate this with the COVID testing.   Further instructions to be reviewed with the patient at her pre-op visit on Monday, 05-14-19 at 10:15 am.

## 2019-05-14 ENCOUNTER — Ambulatory Visit (INDEPENDENT_AMBULATORY_CARE_PROVIDER_SITE_OTHER): Payer: Medicaid Other | Admitting: General Surgery

## 2019-05-14 ENCOUNTER — Other Ambulatory Visit: Payer: Self-pay

## 2019-05-14 ENCOUNTER — Encounter: Payer: Self-pay | Admitting: General Surgery

## 2019-05-14 VITALS — BP 138/74 | HR 82 | Temp 97.2°F | Ht 67.0 in | Wt >= 6400 oz

## 2019-05-14 DIAGNOSIS — E0500 Thyrotoxicosis with diffuse goiter without thyrotoxic crisis or storm: Secondary | ICD-10-CM

## 2019-05-14 NOTE — Progress Notes (Signed)
Patient ID: Kimberly Nolan, female   DOB: 03/05/1986, 33 y.o.   MRN: [id number]  Chief Complaint  Patient presents with  . Pre-op Exam    HPI Kimberly Nolan is a 33 y.o. female.   I initially saw her in May.  My initial consult note is partially copied here:   "She was referred by Dr. Lavone Orn for surgical evaluation of Graves' disease.  [name] states that she has had Graves' disease for the past 8 years.  Due to insurance issues, she has been on and off of medical therapy.  She was recently referred back to endocrinology, by her primary care provider Carles Collet, PA-C.  She is now taking methimazole 10 mg 3 times daily and propranolol, per her report, but I do not have the dosage.  Her most recent labs demonstrate an undetectable TSH, a free T4 elevated at 2.03 (upper limit of normal 1.14), and a normal total T3, though it is near the upper limit of normal.  She has Graves' orbitopathy and as such, it was felt that radioactive iodine ablation would not be the best choice of treatment for her.  As a result, she has been referred for surgical management of her Graves' disease.  Symptom wise, Ms. Evett reports both palpitations and tremors, as well as feeling jittery and anxious.  She endorses photophobia and "bulgy eyes".  She states that she occasionally has a gritty sensation in her eyes.  She reports brittle nails and dry hair.  She has frequent diarrhea.  She says that she is very emotional and feels like her moods are quite labile.  She reports a 20 pound weight gain since December.  She also endorses some discoloration of her bilateral lower extremities as well as occasional tingling in this area.  She states that sometimes she notices itching.  She denies dysphagia to either solids or liquids, but sometimes has pill dysphagia.  She endorses an occasional pressure sensation in the neck, but denies frequent throat clearing or dyspnea in the supine position.  She says that her mother  had radioactive iodine treatment for thyroid problem in the past, though she is not certain whether or not this was for Graves' disease or another hyperthyroid issue.  The patient has never had exposure to amiodarone nor has she received any iodinated contrast.  No history of head or neck irradiation."  Since our initial visit, she states that she has continued to take her anti-thyroidal medication and propranolol as prescribed.  She still has some ocular symptoms.  She is ready to have her thyroid removed, among other reasons, because she thinks the anti-thyroidal medication tastes terrible.   Past Medical History:  Diagnosis Date  . Essential hypertension   . Graves disease   . Morbid obesity ([place])   . Smoker     Past Surgical History:  Procedure Laterality Date  . CESAREAN SECTION      Family History  Problem Relation Age of Onset  . Thyroid disease Mother   . Hypertension Father   . Stroke Father   . Diabetes Brother   . Hypertension Brother     Social History Social History   Tobacco Use  . Smoking status: Current Every Day Smoker    Packs/day: 0.50    Years: 3.00    Pack years: 1.50    Types: Cigarettes  . Smokeless tobacco: Never Used  Substance Use Topics  . Alcohol use: Yes    Comment: Occasionally  . Drug  use: No    Allergies  Allergen Reactions  . Penicillins Itching    Current Outpatient Medications  Medication Sig Dispense Refill  . albuterol (PROVENTIL HFA;VENTOLIN HFA) 108 (90 Base) MCG/ACT inhaler Inhale 2 puffs into the lungs every 6 (six) hours as needed for wheezing or shortness of breath. 1 Inhaler 0  . benzonatate (TESSALON) 200 MG capsule Take 1 capsule (200 mg total) by mouth every 6 (six) hours as needed for cough. 30 capsule 0  . brompheniramine-pseudoephedrine-DM 30-2-10 MG/5ML syrup Take 5 mLs by mouth 4 (four) times daily as needed. 120 mL 0  . ibuprofen (ADVIL,MOTRIN) 600 MG tablet Take 1 tablet (600 mg total) by mouth every 8 (eight)  hours as needed. 30 tablet 0  . methocarbamol (ROBAXIN) 500 MG tablet Take 1 tablet (500 mg total) by mouth 4 (four) times daily. 16 tablet 0  . propranolol (INDERAL) 20 MG tablet TAKE 1 TABLET BY MOUTH TWICE A DAY 180 tablet 0  . methimazole (TAPAZOLE) 10 MG tablet Take 1 tablet (10 mg total) by mouth 3 (three) times daily for 30 days. 90 tablet 0   No current facility-administered medications for this visit.     Review of Systems Review of Systems  All other systems reviewed and are negative.   Blood pressure 138/74, pulse 82, temperature (!) 97.2 F (36.2 C), temperature source Skin, height 5\' 7"  (1.702 m), weight (!) 400 lb (181.4 kg), SpO2 97 %.  Physical Exam Physical Exam Vitals signs reviewed.  Constitutional:      General: She is not in acute distress.    Appearance: She is obese.  HENT:     Head: Normocephalic and atraumatic.     Nose:     Comments: Covered with a mask secondary to COVID-19 precautions    Mouth/Throat:     Comments: Covered with a mask secondary to COVID-19 precautions Eyes:     General: No scleral icterus.    Comments: Periorbital edema is present, left greater than right.  She has a right lid lag.  Neck:     Musculoskeletal: Normal range of motion.     Comments: No thyroid bruit is present.  The thyroid is diffusely enlarged, but does elevate above the level of the clavicles with deglutition. Cardiovascular:     Rate and Rhythm: Normal rate and regular rhythm.     Pulses: Normal pulses.     Heart sounds: No murmur.  Pulmonary:     Effort: Pulmonary effort is normal.     Breath sounds: Normal breath sounds.  Abdominal:     Comments: Obese, exam limited due to body habitus  Genitourinary:    Comments: Deferred Musculoskeletal: Normal range of motion.        General: No swelling.  Skin:    General: Skin is warm and dry.  Neurological:     General: No focal deficit present.     Mental Status: She is alert and oriented to person, place, and  time.  Psychiatric:        Mood and Affect: Mood normal.        Behavior: Behavior normal.     Data Reviewed There are no new data to review since the time of our initial visit.  At that time, her most recent labs showed a free T4 of 2.03.  Assessment Janelle FloorChandrike Scrivens is a 33 year old woman with Graves' disease.  She has ocular involvement, and for that reason radioactive iodine is relatively contraindicated.  She has been taking  her oral anti-thyroidal medications regularly.  She desires surgical treatment of her thyroid disease.  Plan We will plan for a total thyroidectomy with intraoperative nerve monitoring.  This has tentatively been scheduled for 23 July.  Due to her morbid obesity, she will need to visit with anesthesiology in person.  At the time of that visit, I would like to obtain a free T4, a free T3, a calcium, and an albumin.  Provided that the free T4 is less than 2.5, I feel comfortable proceeding with surgery, even if she is not 100% controlled.  In addition, she will initiate a calcium regimen consisting of 2 tablets of Citracal (or equivalent) 3 times daily and 2000 international units of vitamin D3 twice daily, in an effort to stave off hungry bone syndrome.    Duanne GuessJennifer Ahmon Tosi 05/14/2019, 10:41 AM

## 2019-05-14 NOTE — Patient Instructions (Addendum)
Thyroidectomy A thyroidectomy is a surgery that is done to remove the thyroid gland. The thyroid is a butterfly-shaped gland that is located at the lower front of your neck. It produces thyroid hormone, which is a substance that helps to control certain body processes. You may have a:  Total thyroidectomy. All of your thyroid is removed.  Thyroid lobectomy. Part of your thyroid is removed. The amount of thyroid gland tissue that is removed during your surgery depends on the reason for the procedure. Reasons to have this procedure include treatment for:  Thyroid nodules.  Thyroid cancer.  Benign thyroid tumors.  Goiter.  Overactive thyroid gland (hyperthyroidism). There are two ways to do this procedure. Conventional, or open, thyroidectomy uses one large incision to remove the thyroid gland. This is the most common method. Endoscopic thyroidectomy, a less invasive method, uses a narrow tube with a light and camera (endoscope) to remove the gland. Tell a health care provider about:  Any allergies you have.  All medicines you are taking, including vitamins, herbs, eye drops, creams, and over-the-counter medicines.  Any problems you or family members have had with anesthetic medicines.  Any blood disorders you have.  Any surgeries you have had.  Any medical conditions you have.  Whether you are pregnant or may be pregnant. What are the risks? Generally, this is a safe procedure. However, problems may occur, including:  Damage to the parathyroid glands. These are located behind your thyroid gland. They maintain the calcium levels in the body. Damage may lead to: ? A decrease in parathyroid hormone levels (hypoparathyroidism). ? A decrease in calcium levels. This will make your nerves irritable and may cause muscle spasms.  An increase in thyroid hormone.  Damage to the nerves of your voice box (larynx). This can be temporary or long-term (rare).  Hoarseness. This usually  resolves in 24-48 hours.  Bleeding.  Infection. What happens before the procedure? Staying hydrated Follow instructions from your health care provider about hydration, which may include:  Up to 2 hours before the procedure - you may continue to drink clear liquids, such as water, clear fruit juice, black coffee, and plain tea. Eating and drinking restrictions Follow instructions from your health care provider about eating and drinking, which may include:  8 hours before the procedure - stop eating heavy meals or foods such as meat, fried foods, or fatty foods.  6 hours before the procedure - stop eating light meals or foods, such as toast or cereal.  6 hours before the procedure - stop drinking milk or drinks that contain milk.  2 hours before the procedure - stop drinking clear liquids. Medicines Ask your health care provider about:  Changing or stopping your regular medicines. This is especially important if you are taking diabetes medicines or blood thinners.  Taking medicines such as aspirin and ibuprofen. These medicines can thin your blood. Do not take these medicines unless your health care provider tells you to take them.  Taking over-the-counter medicines, vitamins, herbs, and supplements. General instructions  You may be asked to shower with a germ-killing soap.  Plan to have someone take you home from the hospital or clinic.  Plan to have a responsible adult care for you for at least 24 hours after you leave the hospital or clinic. This is important. What happens during the procedure?  To reduce your risk of infection: ? Your health care team will wash or sanitize their hands. ? Hair may be removed from the surgical area. ?  Your skin will be washed with soap.  An IV will be inserted into one of your veins.  You will be given one or more of the following: ? A medicine to help you relax (sedative). ? A medicine to make you fall asleep (general anesthetic).   Your health care provider will perform your surgery using one of two methods: ? For open thyroidectomy, an incision will be made in your lower neck. Muscles in the area will be separated to reveal your thyroid gland. ? For endoscopic thyroidectomy, several small incisions will be made in your neck, chest, or armpit. An endoscopewill be inserted into an incision.  Your health care provider may monitor laryngeal nerve function during the procedure for safety reasons.  Part or all of your thyroid gland will be removed.  A tube (drain) may be placed at the incision site to drain blood and fluids that accumulate under the skin after the procedure. The drain may have to stay in place for a day or two after the procedure.  The incision will be closed with stitches (sutures).  A dressing will be placed over your incision. The procedure may vary among health care providers and hospitals. What happens after the procedure?  Your blood pressure, heart rate, breathing rate, and blood oxygen level will be monitored often until the medicines you were given have worn off.  You will be given pain medicine as needed.  Your provider will check your ability to talk and swallow after the procedure.  You will gradually start to drink liquids and have soft foods as tolerated.  You may have a blood test to check the level of calcium in your body.  If you had a drain put in during the procedure, it will usually be removed the next day. Summary  A thyroidectomy is a surgery that is done to remove the thyroid gland.  The procedure will be done in one of two ways: conventional, or open, thyroidectomy or endoscopic thyroidectomy.  Serious complications are rare.  Plan to have a responsible adult care for you for at least 24 hours after you leave the hospital or clinic. This is important. This information is not intended to replace advice given to you by your health care provider. Make sure you discuss any  questions you have with your health care provider. Document Released: 04/27/2001 Document Revised: 10/14/2017 Document Reviewed: 09/06/2017 Elsevier Patient Education  [place].       Patient to take  Caltrate two tabs three times a day. And vitaimin d3 2000iu  twoaday.  Surgery scheduled on 06/07/2019.

## 2019-05-16 IMAGING — CR DG CHEST 2V
2 series · 2 of 2 positions shown · non-contrast
Comparison: None.

CLINICAL DATA: Chest pain

EXAM:
CHEST - 2 VIEW

[chest pa]
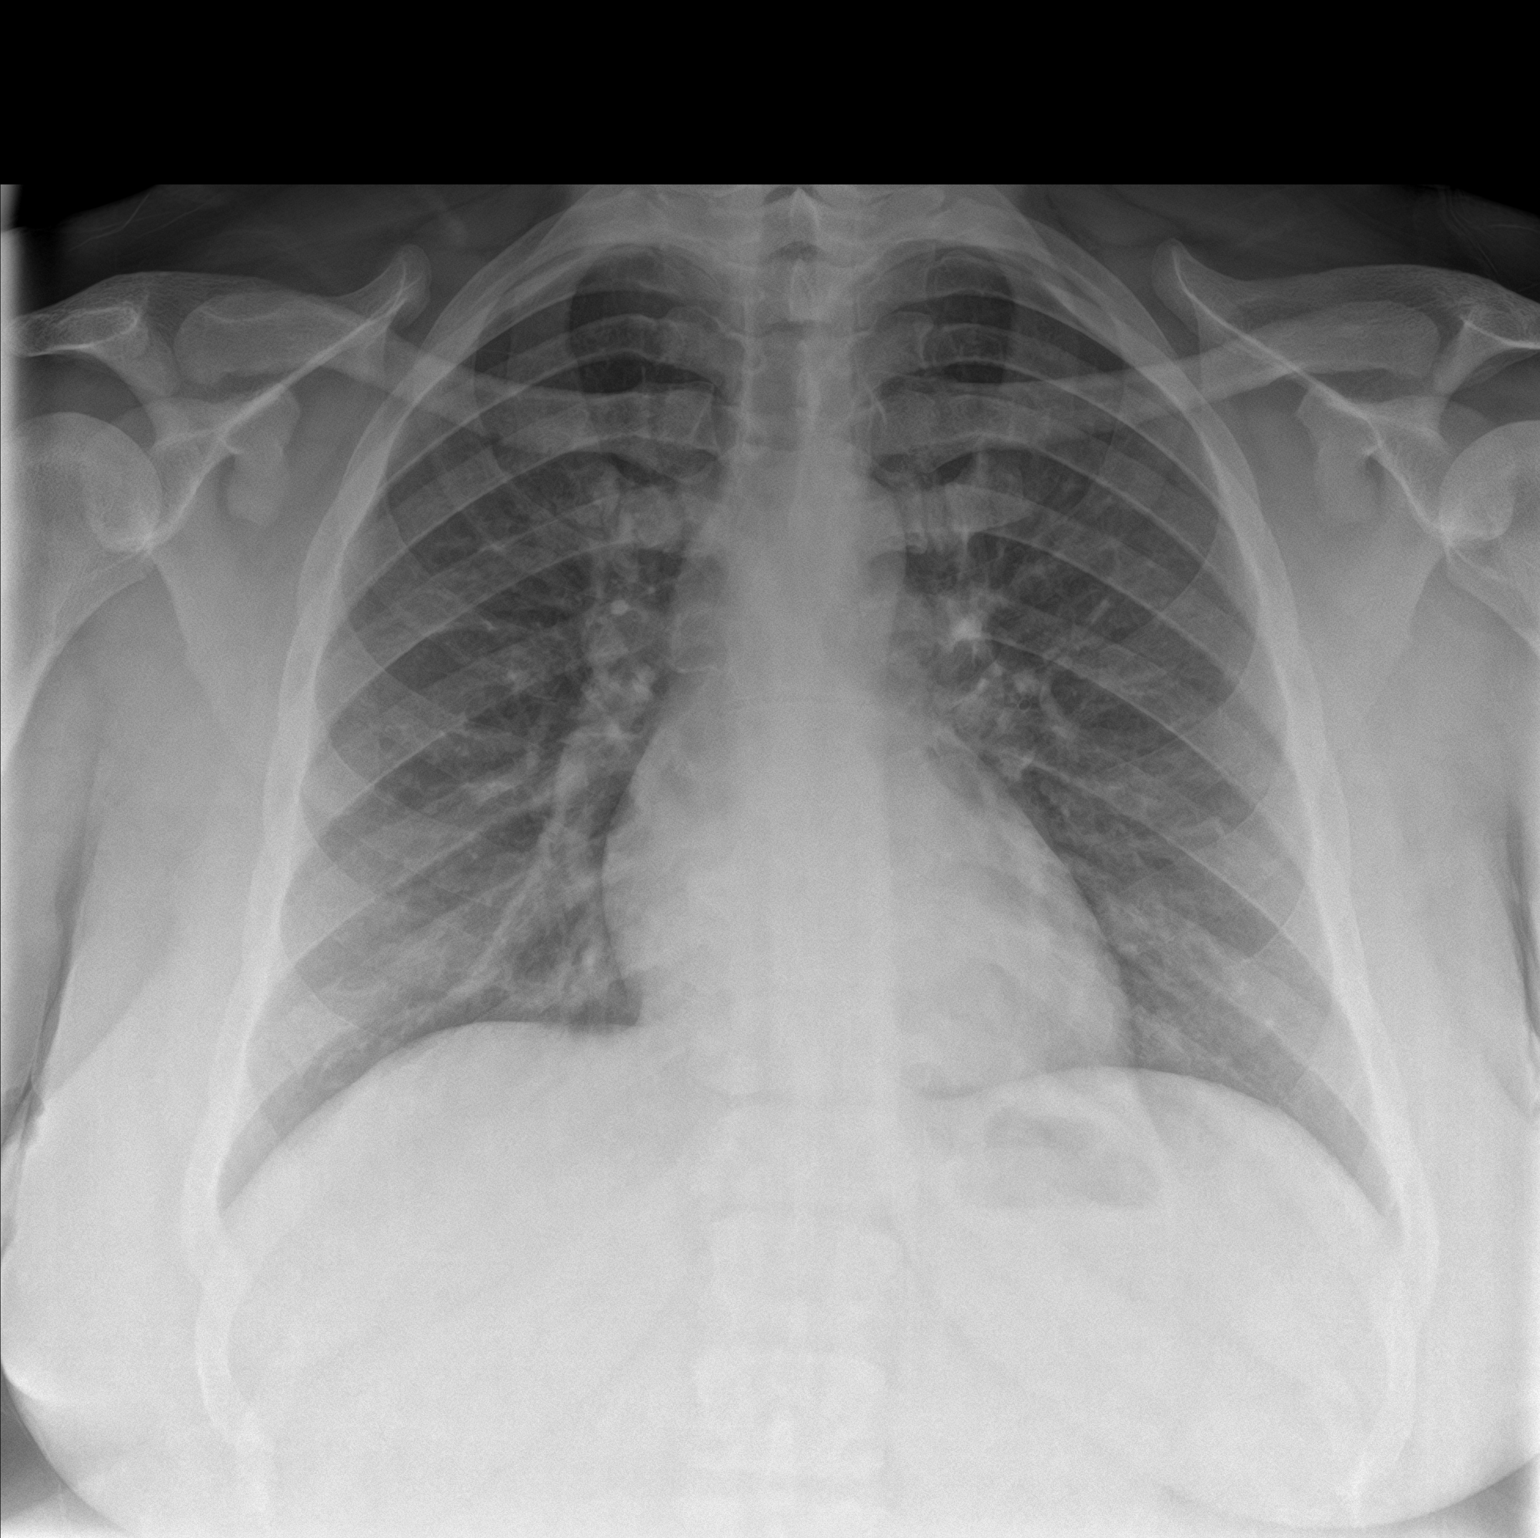

[chest lat]
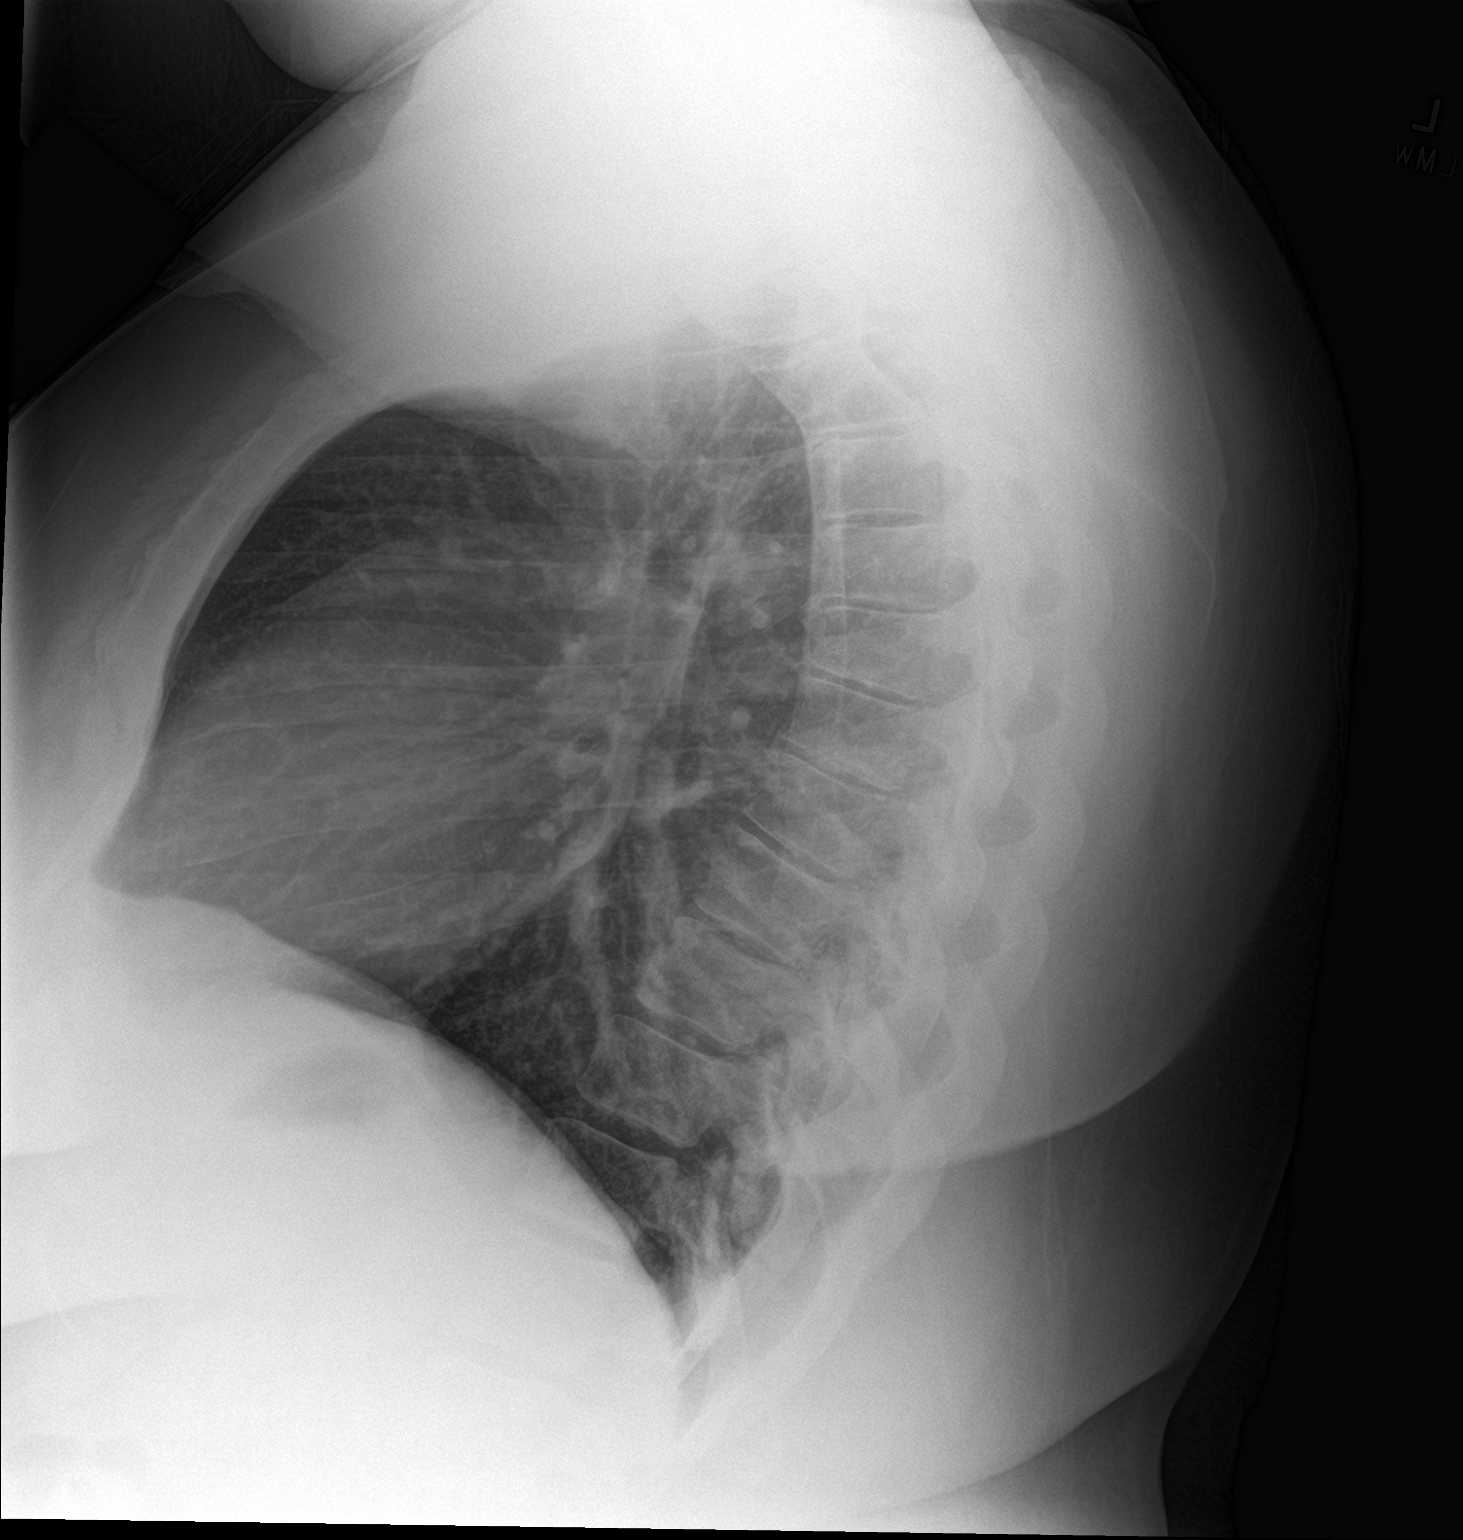

[2 of 2 positions shown; findings below may reference images not displayed]

FINDINGS: The heart size and mediastinal contours are within normal limits.
Both lungs are clear. The visualized skeletal structures are
unremarkable.
IMPRESSION: No active cardiopulmonary disease.

## 2019-06-04 ENCOUNTER — Telehealth: Payer: Self-pay

## 2019-06-04 ENCOUNTER — Encounter
Admission: RE | Admit: 2019-06-04 | Discharge: 2019-06-04 | Disposition: A | Discharge status: Home / Self Care | Payer: Medicaid Other | Source: Ambulatory Visit | Attending: General Surgery | Admitting: General Surgery

## 2019-06-04 ENCOUNTER — Other Ambulatory Visit: Payer: Self-pay

## 2019-06-04 DIAGNOSIS — F17210 Nicotine dependence, cigarettes, uncomplicated: Secondary | ICD-10-CM | POA: Diagnosis not present

## 2019-06-04 DIAGNOSIS — I209 Angina pectoris, unspecified: Secondary | ICD-10-CM | POA: Insufficient documentation

## 2019-06-04 DIAGNOSIS — Z01818 Encounter for other preprocedural examination: Secondary | ICD-10-CM | POA: Insufficient documentation

## 2019-06-04 DIAGNOSIS — Z1159 Encounter for screening for other viral diseases: Secondary | ICD-10-CM | POA: Diagnosis not present

## 2019-06-04 DIAGNOSIS — E0500 Thyrotoxicosis with diffuse goiter without thyrotoxic crisis or storm: Secondary | ICD-10-CM

## 2019-06-04 HISTORY — DX: Angina pectoris, unspecified: I20.9

## 2019-06-04 HISTORY — DX: Dyspnea, unspecified: R06.00

## 2019-06-04 HISTORY — DX: Localized edema: R60.0

## 2019-06-04 HISTORY — DX: Family history of other specified conditions: Z84.89

## 2019-06-04 LAB — BASIC METABOLIC PANEL
Anion gap: 8 (ref 5–15)
BUN: 10 mg/dL (ref 6–20)
CO2: 26 mmol/L (ref 22–32)
Calcium: 8.9 mg/dL (ref 8.9–10.3)
Chloride: 104 mmol/L (ref 98–111)
Creatinine, Ser: 0.65 mg/dL (ref 0.44–1.00)
GFR calc Af Amer: >60 mL/min (ref >60)
GFR calc non Af Amer: >60 mL/min (ref >60)
Glucose, Bld: 95 mg/dL (ref 70–99)
Potassium: 4.1 mmol/L (ref 3.5–5.1)
Sodium: 138 mmol/L (ref 135–145)

## 2019-06-04 LAB — CBC
HCT: 41 % (ref 36.0–46.0)
Hemoglobin: 12.9 g/dL (ref 12.0–15.0)
MCH: 26.9 pg (ref 26.0–34.0)
MCHC: 31.5 g/dL (ref 30.0–36.0)
MCV: 85.6 fL (ref 80.0–100.0)
Platelets: 307 10*3/uL (ref 150–400)
RBC: 4.79 MIL/uL (ref 3.87–5.11)
RDW: 13.3 % (ref 11.5–15.5)
WBC: 5.2 10*3/uL (ref 4.0–10.5)
nRBC: 0 % (ref 0.0–0.2)

## 2019-06-04 MED ORDER — CHLORHEXIDINE GLUCONATE CLOTH 2 % EX PADS
6.0000 | MEDICATED_PAD | Freq: Once | CUTANEOUS | Status: DC
  Filled 2019-06-04: qty 6

## 2019-06-04 NOTE — Pre-Procedure Instructions (Signed)
[facility] FAXED TO [facility]

## 2019-06-04 NOTE — Telephone Encounter (Signed)
Received a call from Pre Admit testing and the patient will need to have Cardiac Clearance prior to surgery. The patient does not have a Cardiologist.  New patient appointment made with Dr Saralyn Pilar at [facility] for 06/14/19 at 2:30 pm. Notes, insurance, and Cardiac Clearance form has been faxed to their office. Spoke with the patient and informed her that she will need to be cleared by Cardiology prior to her surgery. We will reschedule her surgery once clearance has been obtained. She is aware.  OR and Pre admit notified of surgery cancellation.

## 2019-06-04 NOTE — Patient Instructions (Signed)
Your procedure is scheduled on: 06/07/2019 Thurs Report to Same Day Surgery 2nd floor medical mall [facility] Entrance-take elevator on left to 2nd floor.  Check in with surgery information desk.) To find out your arrival time please call [phone] between 1PM - 3PM on 06/06/2019 Wed  Remember: Instructions that are not followed completely may result in serious medical risk, up to and including death, or upon the discretion of your surgeon and anesthesiologist your surgery may need to be rescheduled.    _x___ 1. Do not eat food after midnight the night before your procedure. You may drink clear liquids up to 2 hours before you are scheduled to arrive at the hospital for your procedure.  Do not drink clear liquids within 2 hours of your scheduled arrival to the hospital.  Clear liquids include  --Water or Apple juice without pulp  --Clear carbohydrate beverage such as ClearFast or Gatorade  --Black Coffee or Clear Tea (No milk, no creamers, do not add anything to                  the coffee or Tea Type 1 and type 2 diabetics should only drink water.   ____Ensure clear carbohydrate drink on the way to the hospital for bariatric patients  ____Ensure clear carbohydrate drink 3 hours before surgery.   No gum chewing or hard candies.     __x__ 2. No Alcohol for 24 hours before or after surgery.   __x__3. No Smoking or e-cigarettes for 24 prior to surgery.  Do not use any chewable tobacco products for at least 6 hour prior to surgery   ____  4. Bring all medications with you on the day of surgery if instructed.    __x__ 5. Notify your doctor if there is any change in your medical condition     (cold, fever, infections).    x___6. On the morning of surgery brush your teeth with toothpaste and water.  You may rinse your mouth with mouth wash if you wish.  Do not swallow any toothpaste or mouthwash.   Do not wear jewelry, make-up, hairpins, clips or nail polish.  Do not wear lotions,  powders, or perfumes. You may wear deodorant.  Do not shave 48 hours prior to surgery. Men may shave face and neck.  Do not bring valuables to the hospital.    [facility] is not responsible for any belongings or valuables.               Contacts, dentures or bridgework may not be worn into surgery.  Leave your suitcase in the car. After surgery it may be brought to your room.  For patients admitted to the hospital, discharge time is determined by your                       treatment team.  _  Patients discharged the day of surgery will not be allowed to drive home.  You will need someone to drive you home and stay with you the night of your procedure.    Please read over the following fact sheets that you were given:   [facility] Preparing for Surgery and or MRSA Information   _x___ Take anti-hypertensive listed below, cardiac, seizure, asthma,     anti-reflux and psychiatric medicines. These include:  1. albuterol (PROVENTIL   2.propranolol (INDERAL) 20 MG tablet  3.  4.  5.  6.  ____Fleets enema or Magnesium Citrate as directed.  _x___ Use CHG Soap or sage wipes as directed on instruction sheet   ____ Use inhalers on the day of surgery and bring to hospital day of surgery  ____ Stop Metformin and Janumet 2 days prior to surgery.    ____ Take 1/2 of usual insulin dose the night before surgery and none on the morning     surgery.   _x___ Follow recommendations from Cardiologist, Pulmonologist or PCP regarding          stopping Aspirin, Coumadin, Plavix ,Eliquis, Effient, or Pradaxa, and Pletal.  X____Stop Anti-inflammatories such as Advil, Aleve, Ibuprofen, Motrin, Naproxen, Naprosyn, Goodies powders or aspirin products. OK to take Tylenol and                          Celebrex.   _x___ Stop supplements until after surgery.  But may continue Vitamin D, Vitamin B,       and multivitamin.   ____ Bring C-Pap to the hospital.    

## 2019-06-04 NOTE — Pre-Procedure Instructions (Signed)
Anesthesia notified regarding consult.  Cardiac clearance requested.

## 2019-06-05 LAB — SARS CORONAVIRUS 2 (TAT 6-24 HRS): SARS Coronavirus 2: NEGATIVE

## 2019-06-14 DIAGNOSIS — Z01810 Encounter for preprocedural cardiovascular examination: Secondary | ICD-10-CM | POA: Insufficient documentation

## 2019-06-15 ENCOUNTER — Telehealth: Payer: Self-pay

## 2019-06-15 NOTE — Telephone Encounter (Signed)
Cardiac Clearance has been received from Dr Saralyn Pilar. This has been faxed over to Pre Admit testing.  Call to the patient to see about rescheduling surgery.  Patient's surgery to be rescheduled for 06/27/19 at [facility] with Dr. Celine Ahr. Dr Nestor Lewandowsky will be assisting.  The patient is aware to have COVID-19 testing done on 06/22/19 at the [place] building drive thru ([address] [place]) between 8:00 am and 10:30 am. She is aware to isolate after, have no visitors, wash hands frequently, and avoid touching face.   She has already been seen by pre admit testing.  She is aware to call the day before surgery to get her arrival time.   She is aware to have her lab work done at [facility] this week.  Patient aware to be NPO after midnight and have a driver.   She is aware to check in at the [place] entrance where he/she will be screened for the coronavirus and then sent to Same Day Surgery.   Patient aware that she may have one visitor due to COVID-19 restrictions.   The patient verbalizes understanding of the above.   The patient is aware to call the office should she have further questions.

## 2019-06-19 ENCOUNTER — Other Ambulatory Visit: Payer: Self-pay

## 2019-06-19 DIAGNOSIS — E0500 Thyrotoxicosis with diffuse goiter without thyrotoxic crisis or storm: Secondary | ICD-10-CM

## 2019-06-21 ENCOUNTER — Telehealth: Payer: Self-pay

## 2019-06-21 ENCOUNTER — Other Ambulatory Visit: Payer: Self-pay

## 2019-06-21 ENCOUNTER — Other Ambulatory Visit
Admission: RE | Admit: 2019-06-21 | Discharge: 2019-06-21 | Disposition: A | Discharge status: Home / Self Care | Payer: Medicaid Other | Source: Ambulatory Visit | Attending: General Surgery | Admitting: General Surgery

## 2019-06-21 DIAGNOSIS — E0500 Thyrotoxicosis with diffuse goiter without thyrotoxic crisis or storm: Secondary | ICD-10-CM

## 2019-06-21 DIAGNOSIS — Z01812 Encounter for preprocedural laboratory examination: Secondary | ICD-10-CM | POA: Diagnosis not present

## 2019-06-21 DIAGNOSIS — Z20828 Contact with and (suspected) exposure to other viral communicable diseases: Secondary | ICD-10-CM | POA: Insufficient documentation

## 2019-06-21 LAB — CALCIUM: Calcium: 8.6 mg/dL — ABNORMAL LOW (ref 8.9–10.3)

## 2019-06-21 LAB — ALBUMIN: Albumin: 3.3 g/dL — ABNORMAL LOW (ref 3.5–5.0)

## 2019-06-21 LAB — T4, FREE: Free T4: 1.46 ng/dL — ABNORMAL HIGH (ref 0.61–1.12)

## 2019-06-21 NOTE — Telephone Encounter (Signed)
Patient called the office back and was notified per Caryl-Lyn's message.   The patient verbalizes understanding and states she will go to [facility] today to have labs done.

## 2019-06-21 NOTE — Telephone Encounter (Signed)
Message left for the patient notifying her that she needs to get her lab work done today at [facility] that was ordered for her surgery. She needs to have this done so we have the results back prior to her surgery on 06/27/19.

## 2019-06-22 ENCOUNTER — Other Ambulatory Visit: Admission: RE | Admit: 2019-06-22 | Payer: Medicaid Other | Source: Ambulatory Visit

## 2019-06-22 LAB — T3, FREE: T3, Free: 5.1 pg/mL — ABNORMAL HIGH (ref 2.0–4.4)

## 2019-06-22 LAB — SARS CORONAVIRUS 2 (TAT 6-24 HRS): SARS Coronavirus 2: NEGATIVE

## 2019-06-25 NOTE — Pre-Procedure Instructions (Signed)
Progress Notes - documented in this encounter Kimberly Nolan, Alexander, MD - 06/14/2019 2:30 PM EDT Formatting of this note might be different from the original. New Patient Visit   Chief Complaint: Chief Complaint  Patient presents with  . Establish Care  Nolan surgical clearance parathyroidectomy  . Dizziness  . Edema  ankles  Date of Service: 06/14/2019 Date of Birth: 08-16-1986 PCP: Kimberly Nolan  History of Present Illness: Kimberly Nolan is a 33 y.o.female patient who is referred for preoperative cardiovascular evaluation prior to total parathyroidectomy. Patient has a history of Graves' disease followed by Dr. Tedd Siassolum. She denies chest pain or shortness of breath. She denies palpitations or heart racing. She has mild chronic peripheral edema. The patient is active, walks most days. No prior history of essential hypertension or diabetes. Does smoke. There is no prior history of myocardial infarction, congestive heart failure, chronic kidney disease, stroke or diabetes. ECG reveals normal sinus rhythm, normal ECG.  Past Medical and Surgical History  Past Medical History Past Medical History:  Diagnosis Date  . Graves disease  . Hypertension  . Morbid obesity (CMS-HCC)   Past Surgical History She has a past surgical history that includes Cesarean section.   Medications and Allergies  Current Medications Current Outpatient Medications  Medication Sig Dispense Refill  . calcium carbonate-vitamin D3 (OS-CAL 500+D) 500 mg(1,250mg ) -200 unit tablet Take 1 tablet by mouth 2 (two) times daily with meals  . cholecalciferol (VITAMIN D3) 1000 unit capsule Take 1,000 Units by mouth once daily  . ibuprofen (MOTRIN) 200 MG tablet Take 200 mg by mouth every 6 (six) hours as needed for Pain  . methIMAzole (TAPAZOLE) 10 MG tablet Take 3 tablets (30 mg total) by mouth once daily 90 tablet 2  . propranoloL (INDERAL) 20 MG tablet Take 20 mg by mouth 2 (two) times daily   No current  facility-administered medications for this visit.   Allergies Penicillins  Social and Family History  Social History reports that she has been smoking cigarettes. She has been smoking about 0.25 packs per day. She has never used smokeless tobacco. She reports that she does not drink alcohol.  Family History family history includes Diabetes type II in her brother, maternal grandmother, and paternal grandfather; Luiz BlareGraves' disease in her mother; High blood pressure (Hypertension) in her brother, father, maternal grandfather, maternal grandmother, mother, paternal grandfather, and sister; Stroke in her father; Thyroid disease in her mother.   Review of Systems   Review of Systems: The patient denies chest pain, shortness of breath, orthopnea, paroxysmal nocturnal dyspnea, pedal edema, palpitations, heart racing, presyncope, syncope. Review of 12 Systems is negative except as described above.  Physical Examination   Vitals:BP 122/62  Pulse 103  Ht 170.2 cm (5\' 7" )  Wt (!) 183.9 kg (405 lb 6.4 oz)  SpO2 98%  BMI 63.49 kg/m  Ht:170.2 cm (5\' 7" ) Wt:(!) 183.9 kg (405 lb 6.4 oz) ZOX:WRUEBSA:Body surface area is 2.95 meters squared. Body mass index is 63.49 kg/m.  HEENT: Pupils equally reactive to light and accomodation  Neck: Supple without thyromegaly, carotid pulses 2+ Lungs: clear to auscultation bilaterally; no wheezes, rales, rhonchi Heart: Regular rate and rhythm. No gallops, murmurs or rub Abdomen: soft nontender, nondistended, with normal bowel sounds Extremities: Trace pedal edema Peripheral Pulses: 2+ in all extremities, 2+ femoral pulses bilaterally  Assessment   33 y.o. female with  1. Essential hypertension with goal blood pressure less than 130/85  2. Tobacco dependence  3. Current every day smoker  4. Preop cardiovascular exam   33 year old female referred for preoperative cardiovascular evaluation. Patient denies chest pain or shortness of breath. He denies essential  hypertension or diabetes. She denies prior history of myocardial infarction, congestive heart failure, chronic kidney disease, stroke, or diabetes. CT is normal sinus rhythm normal ECG. The patient should be at low risk for serious cardiovascular complication during total parathyroidectomy.  Plan   1. Continue current medication 2. Proceed with total parathyroidectomy 3. Defer cardiac diagnostics at this time 4. Return to clinic as needed  No orders of the defined types were placed in this encounter.  Return if symptoms worsen or fail to improve.  Isaias Cowman, MD PhD [facility]    Electronically signed by Isaias Cowman, MD at 06/14/2019 3:08 PM EDT

## 2019-06-27 ENCOUNTER — Encounter: Payer: Self-pay | Admitting: *Deleted

## 2019-06-27 ENCOUNTER — Encounter
Admission: RE | Disposition: A | Discharge status: Home / Self Care | Payer: Self-pay | Source: Home / Self Care | Attending: General Surgery

## 2019-06-27 ENCOUNTER — Other Ambulatory Visit: Payer: Self-pay

## 2019-06-27 ENCOUNTER — Ambulatory Visit: Payer: Medicaid Other | Admitting: Certified Registered"

## 2019-06-27 ENCOUNTER — Observation Stay
Admission: RE | Admit: 2019-06-27 | Discharge: 2019-06-28 | Disposition: A | Discharge status: Home / Self Care | Payer: Medicaid Other | Attending: General Surgery | Admitting: General Surgery

## 2019-06-27 ENCOUNTER — Ambulatory Visit: Admit: 2019-06-27 | Payer: Medicaid Other | Admitting: General Surgery

## 2019-06-27 DIAGNOSIS — Z20828 Contact with and (suspected) exposure to other viral communicable diseases: Secondary | ICD-10-CM | POA: Diagnosis not present

## 2019-06-27 DIAGNOSIS — Z880 Allergy status to penicillin: Secondary | ICD-10-CM | POA: Insufficient documentation

## 2019-06-27 DIAGNOSIS — Z8349 Family history of other endocrine, nutritional and metabolic diseases: Secondary | ICD-10-CM | POA: Diagnosis not present

## 2019-06-27 DIAGNOSIS — Z6841 Body Mass Index (BMI) 40.0 and over, adult: Secondary | ICD-10-CM | POA: Diagnosis not present

## 2019-06-27 DIAGNOSIS — Z9089 Acquired absence of other organs: Secondary | ICD-10-CM

## 2019-06-27 DIAGNOSIS — Z79899 Other long term (current) drug therapy: Secondary | ICD-10-CM | POA: Diagnosis not present

## 2019-06-27 DIAGNOSIS — E0500 Thyrotoxicosis with diffuse goiter without thyrotoxic crisis or storm: Principal | ICD-10-CM | POA: Insufficient documentation

## 2019-06-27 DIAGNOSIS — E890 Postprocedural hypothyroidism: Secondary | ICD-10-CM

## 2019-06-27 DIAGNOSIS — Z8249 Family history of ischemic heart disease and other diseases of the circulatory system: Secondary | ICD-10-CM | POA: Insufficient documentation

## 2019-06-27 DIAGNOSIS — F17210 Nicotine dependence, cigarettes, uncomplicated: Secondary | ICD-10-CM | POA: Diagnosis not present

## 2019-06-27 DIAGNOSIS — Z833 Family history of diabetes mellitus: Secondary | ICD-10-CM | POA: Diagnosis not present

## 2019-06-27 DIAGNOSIS — Z823 Family history of stroke: Secondary | ICD-10-CM | POA: Insufficient documentation

## 2019-06-27 DIAGNOSIS — Z791 Long term (current) use of non-steroidal anti-inflammatories (NSAID): Secondary | ICD-10-CM | POA: Insufficient documentation

## 2019-06-27 DIAGNOSIS — Z7951 Long term (current) use of inhaled steroids: Secondary | ICD-10-CM | POA: Insufficient documentation

## 2019-06-27 DIAGNOSIS — I10 Essential (primary) hypertension: Secondary | ICD-10-CM | POA: Diagnosis not present

## 2019-06-27 HISTORY — PX: THYROIDECTOMY: SHX17

## 2019-06-27 HISTORY — PX: PARATHYROIDECTOMY: SHX19

## 2019-06-27 LAB — POCT PREGNANCY, URINE: Preg Test, Ur: NEGATIVE

## 2019-06-27 LAB — ALBUMIN: Albumin: 3 g/dL — ABNORMAL LOW (ref 3.5–5.0)

## 2019-06-27 LAB — CALCIUM: Calcium: 8.3 mg/dL — ABNORMAL LOW (ref 8.9–10.3)

## 2019-06-27 LAB — SARS CORONAVIRUS 2 BY RT PCR (HOSPITAL ORDER, PERFORMED IN ~~LOC~~ HOSPITAL LAB): SARS Coronavirus 2: NEGATIVE

## 2019-06-27 SURGERY — THYROIDECTOMY
Anesthesia: General

## 2019-06-27 MED ORDER — MENTHOL 3 MG MT LOZG
1.0000 | LOZENGE | OROMUCOSAL | Status: DC | PRN
  Filled 2019-06-27: qty 9

## 2019-06-27 MED ORDER — FAMOTIDINE 20 MG PO TABS
20.0000 mg | ORAL_TABLET | Freq: Once | ORAL | Status: AC
  Administered 2019-06-27: 20 mg via ORAL

## 2019-06-27 MED ORDER — DEXTROSE IN LACTATED RINGERS 5 % IV SOLN
INTRAVENOUS | Status: DC
  Administered 2019-06-27 – 2019-06-28 (×2): via INTRAVENOUS

## 2019-06-27 MED ORDER — PROPOFOL 10 MG/ML IV BOLUS
INTRAVENOUS | Status: AC
  Filled 2019-06-27: qty 20

## 2019-06-27 MED ORDER — ONDANSETRON HCL 4 MG/2ML IJ SOLN: 4.0000 mg | Freq: Once | INTRAMUSCULAR | Status: DC | PRN

## 2019-06-27 MED ORDER — ACETAMINOPHEN 500 MG PO TABS
1000.0000 mg | ORAL_TABLET | ORAL | Status: AC
  Administered 2019-06-27: 1000 mg via ORAL

## 2019-06-27 MED ORDER — FENTANYL CITRATE (PF) 100 MCG/2ML IJ SOLN
INTRAMUSCULAR | Status: AC
  Filled 2019-06-27: qty 2

## 2019-06-27 MED ORDER — FENTANYL CITRATE (PF) 100 MCG/2ML IJ SOLN
INTRAMUSCULAR | Status: AC
  Administered 2019-06-27: 25 ug via INTRAVENOUS
  Filled 2019-06-27: qty 2

## 2019-06-27 MED ORDER — PROPOFOL 500 MG/50ML IV EMUL
INTRAVENOUS | Status: AC
  Filled 2019-06-27: qty 50

## 2019-06-27 MED ORDER — CELECOXIB 200 MG PO CAPS
ORAL_CAPSULE | ORAL | Status: AC
  Filled 2019-06-27: qty 1

## 2019-06-27 MED ORDER — DEXAMETHASONE SODIUM PHOSPHATE 10 MG/ML IJ SOLN
INTRAMUSCULAR | Status: DC | PRN
  Administered 2019-06-27: 10 mg via INTRAVENOUS

## 2019-06-27 MED ORDER — PROPOFOL 500 MG/50ML IV EMUL
INTRAVENOUS | Status: DC | PRN
  Administered 2019-06-27: 100 ug/kg/min via INTRAVENOUS

## 2019-06-27 MED ORDER — MIDAZOLAM HCL 2 MG/2ML IJ SOLN
INTRAMUSCULAR | Status: AC
  Filled 2019-06-27: qty 2

## 2019-06-27 MED ORDER — ONDANSETRON 4 MG PO TBDP: 4.0000 mg | ORAL_TABLET | Freq: Four times a day (QID) | ORAL | Status: DC | PRN

## 2019-06-27 MED ORDER — FENTANYL CITRATE (PF) 100 MCG/2ML IJ SOLN
25.0000 ug | INTRAMUSCULAR | Status: DC | PRN
  Administered 2019-06-27 (×4): 25 ug via INTRAVENOUS

## 2019-06-27 MED ORDER — FAMOTIDINE 20 MG PO TABS
ORAL_TABLET | ORAL | Status: AC
  Filled 2019-06-27: qty 1

## 2019-06-27 MED ORDER — SUCCINYLCHOLINE CHLORIDE 20 MG/ML IJ SOLN
INTRAMUSCULAR | Status: DC | PRN
  Administered 2019-06-27: 180 mg via INTRAVENOUS

## 2019-06-27 MED ORDER — IBUPROFEN 600 MG PO TABS
600.0000 mg | ORAL_TABLET | Freq: Four times a day (QID) | ORAL | Status: DC | PRN
  Filled 2019-06-27: qty 1

## 2019-06-27 MED ORDER — HEMOSTATIC AGENTS (NO CHARGE) OPTIME
TOPICAL | Status: DC | PRN
  Administered 2019-06-27: 1

## 2019-06-27 MED ORDER — LACTATED RINGERS IV SOLN
INTRAVENOUS | Status: DC
  Administered 2019-06-27 (×2): via INTRAVENOUS

## 2019-06-27 MED ORDER — VITAMIN D3 25 MCG (1000 UNIT) PO TABS
5000.0000 [IU] | ORAL_TABLET | Freq: Every day | ORAL | Status: DC
  Administered 2019-06-27 – 2019-06-28 (×2): 5000 [IU] via ORAL
  Filled 2019-06-27 (×3): qty 5

## 2019-06-27 MED ORDER — SEVOFLURANE IN SOLN
RESPIRATORY_TRACT | Status: AC
  Filled 2019-06-27: qty 250

## 2019-06-27 MED ORDER — EVICEL 5 ML EX KIT
PACK | CUTANEOUS | Status: AC
  Filled 2019-06-27: qty 1

## 2019-06-27 MED ORDER — METOPROLOL TARTRATE 5 MG/5ML IV SOLN
INTRAVENOUS | Status: DC | PRN
  Administered 2019-06-27 (×3): 1 mg via INTRAVENOUS
  Administered 2019-06-27: 2 mg via INTRAVENOUS

## 2019-06-27 MED ORDER — LEVOTHYROXINE SODIUM 100 MCG PO TABS
250.0000 ug | ORAL_TABLET | Freq: Every day | ORAL | Status: DC
  Administered 2019-06-28: 250 ug via ORAL
  Filled 2019-06-27: qty 3
  Filled 2019-06-27: qty 2

## 2019-06-27 MED ORDER — HYDROCODONE-ACETAMINOPHEN 5-325 MG PO TABS
1.0000 | ORAL_TABLET | ORAL | Status: DC | PRN
  Administered 2019-06-28 (×3): 2 via ORAL
  Filled 2019-06-27 (×3): qty 2

## 2019-06-27 MED ORDER — ACETAMINOPHEN 500 MG PO TABS
ORAL_TABLET | ORAL | Status: AC
  Filled 2019-06-27: qty 2

## 2019-06-27 MED ORDER — ACETAMINOPHEN 500 MG PO TABS
1000.0000 mg | ORAL_TABLET | Freq: Four times a day (QID) | ORAL | Status: DC
  Administered 2019-06-27: 1000 mg via ORAL
  Filled 2019-06-27 (×3): qty 2

## 2019-06-27 MED ORDER — LIDOCAINE HCL (CARDIAC) PF 100 MG/5ML IV SOSY
PREFILLED_SYRINGE | INTRAVENOUS | Status: DC | PRN
  Administered 2019-06-27: 100 mg via INTRAVENOUS

## 2019-06-27 MED ORDER — ONDANSETRON HCL 4 MG/2ML IJ SOLN: 4.0000 mg | Freq: Four times a day (QID) | INTRAMUSCULAR | Status: DC | PRN

## 2019-06-27 MED ORDER — CELECOXIB 200 MG PO CAPS
200.0000 mg | ORAL_CAPSULE | ORAL | Status: AC
  Administered 2019-06-27: 200 mg via ORAL

## 2019-06-27 MED ORDER — EVICEL 5 ML EX KIT
PACK | CUTANEOUS | Status: DC | PRN
  Administered 2019-06-27: 5 mL

## 2019-06-27 MED ORDER — PROPOFOL 10 MG/ML IV BOLUS
INTRAVENOUS | Status: DC | PRN
  Administered 2019-06-27: 200 mg via INTRAVENOUS

## 2019-06-27 MED ORDER — CALCIUM CARBONATE 1250 (500 CA) MG PO TABS
2.0000 | ORAL_TABLET | Freq: Three times a day (TID) | ORAL | Status: DC
  Administered 2019-06-28 (×2): 1000 mg via ORAL
  Filled 2019-06-27 (×4): qty 2

## 2019-06-27 MED ORDER — FENTANYL CITRATE (PF) 100 MCG/2ML IJ SOLN
INTRAMUSCULAR | Status: DC | PRN
  Administered 2019-06-27: 100 ug via INTRAVENOUS
  Administered 2019-06-27: 50 ug via INTRAVENOUS
  Administered 2019-06-27: 75 ug via INTRAVENOUS
  Administered 2019-06-27: 25 ug via INTRAVENOUS
  Administered 2019-06-27: 50 ug via INTRAVENOUS

## 2019-06-27 MED ORDER — ONDANSETRON HCL 4 MG/2ML IJ SOLN
INTRAMUSCULAR | Status: DC | PRN
  Administered 2019-06-27: 4 mg via INTRAVENOUS

## 2019-06-27 SURGICAL SUPPLY — 41 items
BACTOSHIELD CHG 4% 4OZ (MISCELLANEOUS) ×1
BASIN GRAD PLASTIC 32OZ STRL (MISCELLANEOUS) ×3 IMPLANT
BLADE SURG 15 STRL LF DISP TIS (BLADE) ×2 IMPLANT
BLADE SURG 15 STRL SS (BLADE) ×1
CANISTER SUCT 1200ML W/VALVE (MISCELLANEOUS) ×3 IMPLANT
CLIP VESOCCLUDE SM WIDE 6/CT (CLIP) ×3 IMPLANT
COVER WAND RF STERILE (DRAPES) ×3 IMPLANT
DERMABOND ADVANCED (GAUZE/BANDAGES/DRESSINGS) ×1
DERMABOND ADVANCED .7 DNX12 (GAUZE/BANDAGES/DRESSINGS) ×2 IMPLANT
DRAPE MAG INST 16X20 L/F (DRAPES) ×3 IMPLANT
ELECT CAUTERY BLADE TIP 2.5 (TIP) ×3
ELECT EZSTD 165MM 6.5IN (MISCELLANEOUS) ×3
ELECT REM PT RETURN 9FT ADLT (ELECTROSURGICAL) ×3
ELECTRODE CAUTERY BLDE TIP 2.5 (TIP) ×2 IMPLANT
ELECTRODE EZSTD 165MM 6.5IN (MISCELLANEOUS) ×2 IMPLANT
ELECTRODE REM PT RTRN 9FT ADLT (ELECTROSURGICAL) ×2 IMPLANT
EVICEL AIRLESS SPRAY ACCES (MISCELLANEOUS) ×3 IMPLANT
GAUZE 4X4 16PLY RFD (DISPOSABLE) ×9 IMPLANT
GLOVE BIO SURGEON STRL SZ 6.5 (GLOVE) ×21 IMPLANT
GLOVE INDICATOR 7.0 STRL GRN (GLOVE) ×27 IMPLANT
GOWN STRL REUS W/ TWL LRG LVL3 (GOWN DISPOSABLE) ×12 IMPLANT
GOWN STRL REUS W/TWL LRG LVL3 (GOWN DISPOSABLE) ×6
KIT TURNOVER KIT A (KITS) ×3 IMPLANT
LABEL OR SOLS (LABEL) ×3 IMPLANT
NS IRRIG 500ML POUR BTL (IV SOLUTION) ×6 IMPLANT
PACK HEAD/NECK (MISCELLANEOUS) ×3 IMPLANT
PROBE NEUROSIGN BIPOL (MISCELLANEOUS) ×2 IMPLANT
PROBE NEUROSIGN BIPOLAR (MISCELLANEOUS) ×1
SCRUB CHG 4% DYNA-HEX 4OZ (MISCELLANEOUS) ×2 IMPLANT
SHEARS HARMONIC 9CM CVD (BLADE) IMPLANT
SPONGE KITTNER 5P (MISCELLANEOUS) ×6 IMPLANT
STRIP CLOSURE SKIN 1/2X4 (GAUZE/BANDAGES/DRESSINGS) ×3 IMPLANT
SURGICEL SNOW 2X4 (HEMOSTASIS) ×6 IMPLANT
SUT MNCRL AB 4-0 PS2 18 (SUTURE) ×3 IMPLANT
SUT PROLENE 4 0 PS 2 18 (SUTURE) ×3 IMPLANT
SUT SILK 2 0 (SUTURE) ×3
SUT SILK 2-0 18XBRD TIE 12 (SUTURE) ×4 IMPLANT
SUT SILK 2-0 30XBRD TIE 12 (SUTURE) ×2 IMPLANT
SUT VIC AB 4-0 SH 27 (SUTURE) ×1
SUT VIC AB 4-0 SH 27XANBCTRL (SUTURE) ×2 IMPLANT
TRAY FOLEY MTR SLVR 16FR STAT (SET/KITS/TRAYS/PACK) ×3 IMPLANT

## 2019-06-27 NOTE — H&P (Signed)
Kimberly Nolan is a 33 y.o. female.   I initially saw her in May.  My initial consult note is partially copied here:   "She was referred by Dr. Lavone Orn for surgical evaluation of Graves' disease.  [name] states that she has had Graves' disease for the past 8 years.  Due to insurance issues, she has been on and off of medical therapy.  She was recently referred back to endocrinology, by her primary care provider Carles Collet, PA-C.  She is now taking methimazole 10 mg 3 times daily and propranolol, per her report, but I do not have the dosage.  Her most recent labs demonstrate an undetectable TSH, a free T4 elevated at 2.03 (upper limit of normal 1.14), and a normal total T3, though it is near the upper limit of normal.  She has Graves' orbitopathy and as such, it was felt that radioactive iodine ablation would not be the best choice of treatment for her.  As a result, she has been referred for surgical management of her Graves' disease.  Symptom wise, [name] reports both palpitations and tremors, as well as feeling jittery and anxious.  She endorses photophobia and "bulgy eyes".  She states that she occasionally has a gritty sensation in her eyes.  She reports brittle nails and dry hair.  She has frequent diarrhea.  She says that she is very emotional and feels like her moods are quite labile.  She reports a 20 pound weight gain since December.  She also endorses some discoloration of her bilateral lower extremities as well as occasional tingling in this area.  She states that sometimes she notices itching.  She denies dysphagia to either solids or liquids, but sometimes has pill dysphagia.  She endorses an occasional pressure sensation in the neck, but denies frequent throat clearing or dyspnea in the supine position.  She says that her mother had radioactive iodine treatment for thyroid problem in the past, though she is not certain whether or not this was for Graves' disease or another  hyperthyroid issue.  The patient has never had exposure to amiodarone nor has she received any iodinated contrast.  No history of head or neck irradiation."  Since our initial visit, she states that she has continued to take her anti-thyroidal medication and propranolol as prescribed.  She still has some ocular symptoms.  She is ready to have her thyroid removed, among other reasons, because she thinks the anti-thyroidal medication tastes terrible.       Past Medical History:  Diagnosis Date  . Essential hypertension   . Graves disease   . Morbid obesity ([place])   . Smoker          Past Surgical History:  Procedure Laterality Date  . CESAREAN SECTION           Family History  Problem Relation Age of Onset  . Thyroid disease Mother   . Hypertension Father   . Stroke Father   . Diabetes Brother   . Hypertension Brother     Social History Social History        Tobacco Use  . Smoking status: Current Every Day Smoker    Packs/day: 0.50    Years: 3.00    Pack years: 1.50    Types: Cigarettes  . Smokeless tobacco: Never Used  Substance Use Topics  . Alcohol use: Yes    Comment: Occasionally  . Drug use: No        Allergies  Allergen  Reactions  . Penicillins Itching          Current Outpatient Medications  Medication Sig Dispense Refill  . albuterol (PROVENTIL HFA;VENTOLIN HFA) 108 (90 Base) MCG/ACT inhaler Inhale 2 puffs into the lungs every 6 (six) hours as needed for wheezing or shortness of breath. 1 Inhaler 0  . benzonatate (TESSALON) 200 MG capsule Take 1 capsule (200 mg total) by mouth every 6 (six) hours as needed for cough. 30 capsule 0  . brompheniramine-pseudoephedrine-DM 30-2-10 MG/5ML syrup Take 5 mLs by mouth 4 (four) times daily as needed. 120 mL 0  . ibuprofen (ADVIL,MOTRIN) 600 MG tablet Take 1 tablet (600 mg total) by mouth every 8 (eight) hours as needed. 30 tablet 0  . methocarbamol (ROBAXIN) 500 MG tablet Take 1  tablet (500 mg total) by mouth 4 (four) times daily. 16 tablet 0  . propranolol (INDERAL) 20 MG tablet TAKE 1 TABLET BY MOUTH TWICE A DAY 180 tablet 0  . methimazole (TAPAZOLE) 10 MG tablet Take 1 tablet (10 mg total) by mouth 3 (three) times daily for 30 days. 90 tablet 0   No current facility-administered medications for this visit.     Review of Systems Review of Systems  All other systems reviewed and are negative.  Vitals:   06/27/19 0900  BP: 139/68  Pulse: 86  Resp: 16  Temp: (!) 97.2 F (36.2 C)  SpO2: 100%   Gen: A&O x 3 HEENT: + proptosis (mild), + periorbital edema CV: RRR Pulm: normal WOB on RA Abd: obese   Results for Corky DownsFOUST, Jenayah K (MRN [id number]) as of 06/27/2019 10:03  Ref. Range 06/21/2019 14:17  Calcium Latest Ref Range: 8.9 - 10.3 mg/dL 8.6 (L)  Albumin Latest Ref Range: 3.5 - 5.0 g/dL 3.3 (L)  Triiodothyronine,Free,Serum Latest Ref Range: 2.0 - 4.4 pg/mL 5.1 (H)  T4,Free(Direct) Latest Ref Range: 0.61 - 1.12 ng/dL [phone] (H)   A/P: 33 y/o F with Graves' disease, who desires definitive therapy.  Will plan for total thyroidectomy today. The risks of thyroid surgery were discussed, including (but not limited to): bleeding, infection, damage to surrounding structures/tissues, injury (temporary or permanent) to the recurrent laryngeal nerve, hypoparathyroidism (temporary or permanent), need for thyroid hormone replacement therapy, need for additional surgery and/or treatment, recurrence of disease, tracheostomy (temporary or permanent).  The patient had the opportunity to ask any questions and these were answered to their satisfaction.

## 2019-06-27 NOTE — Anesthesia Post-op Follow-up Note (Signed)
Anesthesia QCDR form completed.        

## 2019-06-27 NOTE — Anesthesia Preprocedure Evaluation (Signed)
Anesthesia Evaluation  Patient identified by MRN, date of birth, ID band Patient awake    Reviewed: Allergy & Precautions, H&P , NPO status , Patient's Chart, lab work & pertinent test results, reviewed documented beta blocker date and time   Airway Mallampati: III  TM Distance: >3 FB Neck ROM: full    Dental  (+) Teeth Intact   Pulmonary shortness of breath and with exertion, pneumonia, Current Smoker,    Pulmonary exam normal        Cardiovascular Exercise Tolerance: Poor hypertension, On Medications + angina with exertion Normal cardiovascular exam Rhythm:regular Rate:Normal     Neuro/Psych negative neurological ROS  negative psych ROS   GI/Hepatic negative GI ROS, Neg liver ROS,   Endo/Other  Hyperthyroidism   Renal/GU negative Renal ROS  negative genitourinary   Musculoskeletal   Abdominal   Peds  Hematology negative hematology ROS (+)   Anesthesia Other Findings Past Medical History: No date: Anginal pain (HCC) No date: Dyspnea No date: Essential hypertension No date: Family history of adverse reaction to anesthesia     Comment:  Mother slow to wake up after surgery No date: Graves disease No date: Lower extremity edema No date: Morbid obesity ([place]) No date: Smoker Past Surgical History: No date: CESAREAN SECTION BMI    Body Mass Index: 62.65 kg/m     Reproductive/Obstetrics negative OB ROS                             Anesthesia Physical Anesthesia Plan  ASA: III  Anesthesia Plan: General ETT   Post-op Pain Management:    Induction:   PONV Risk Score and Plan:   Airway Management Planned:   Additional Equipment:   Intra-op Plan:   Post-operative Plan:   Informed Consent: I have reviewed the patients History and Physical, chart, labs and discussed the procedure including the risks, benefits and alternatives for the proposed anesthesia with the patient or  authorized representative who has indicated his/her understanding and acceptance.     Dental Advisory Given  Plan Discussed with: CRNA  Anesthesia Plan Comments:         Anesthesia Quick Evaluation

## 2019-06-27 NOTE — Transfer of Care (Signed)
Immediate Anesthesia Transfer of Care Note  Patient: Kimberly Nolan  Procedure(s) Performed: TOTAL THYROIDECTOMY AND PARATHYROID AUTOTRANSPLANT (N/A )  Patient Location: PACU  Anesthesia Type:General  Level of Consciousness: drowsy and patient cooperative  Airway & Oxygen Therapy: Patient Spontanous Breathing and Patient connected to face mask oxygen  Post-op Assessment: Report given to RN and Post -op Vital signs reviewed and stable  Post vital signs: Reviewed and stable  Last Vitals:  Vitals Value Taken Time  BP 147/87 06/27/19 1505  Temp 36.7 C 06/27/19 1505  Pulse 112 06/27/19 1507  Resp 0 06/27/19 1507  SpO2 100 % 06/27/19 1507  Vitals shown include unvalidated device data.  Last Pain:  Vitals:   06/27/19 0900  TempSrc: Temporal  PainSc: 0-No pain         Complications: No apparent anesthesia complications

## 2019-06-27 NOTE — Op Note (Signed)
Operative Note  Preoperative Diagnosis:  Graves' disease  Postoperative Diagnosis:  Graves' disease  Operation:  Parathyroid Autotransplant x 1 and Total Thyroidectomy with Cervical Extraction of Substernal Component  Surgeon: Duanne GuessJennifer Hiroko Tregre, MD  Assistant: Hulda Marinimothy Oaks, MD (a second surgeon was required secondary to the technical complexity of the case, the difficulty of exposure in light of the patient's super morbid obesity and the lack of other qualified assistant available)  Anesthesia: General endotracheal with nerve monitoring system  Findings: An enlarged and highly vascular thyroid gland consistent with the preoperative diagnosis.  It extended beneath the clavicles bilaterally.  Both recurrent laryngeal nerves were identified and gave good functional signals throughout the case using the neurostim system.  A single parathyroid gland was found adherent to the surgical specimen and was therefore autotransplanted at the end of the case.  No other parathyroid tissue was appreciated during the operation.  Indications: Kimberly Nolan is a 33 year old woman with Graves' disease.  She is super morbidly obese and has eye findings.  Based upon these characteristics, it was felt she would benefit more from total thyroidectomy versus an attempt at radioactive iodine ablation.  The risks of the operation were discussed with her and she agreed to proceed.  Procedure In Detail: The patient was identified in the preoperative holding area and brought to the operating room where she was placed supine on the OR table.  All bony prominences were padded and bilateral sequential compression devices were placed on the lower extremities.  General endotracheal anesthesia was induced without incident, using the nerve monitoring system.  Tube placement was verified via fiberoptic laryngoscope.  A grounding lead was placed for the nerve monitoring system.  The patient was then positioned appropriately for the  operation and sterilely prepped and draped in standard fashion.  A timeout was performed confirming the patient's identity, the procedure being performed, her allergies, all necessary equipment was available, and that maintenance anesthesia was adequate.  Due to the size of the goiter seen on ultrasound and the patient's body habitus, we made a 10 cm transverse cervical incision in a natural skin fold that was appropriately positioned for the operation.  It was carried down through the subcutaneous tissues and platysma using electrocautery.  Subplatysmal flaps were elevated and the strap muscles were divided in the median raphae.  We retracted the strap muscles laterally.  We bluntly dissected out the superior pole vessels and then sequentially divided them with silk ties and the harmonic scalpel.  We continued mobilizing the thyroid by freeing filmy strands of strap muscle away until the thyroid could be rotated medially.  We dissected into the lateral fibrofatty tissues of the central neck and identified the recurrent laryngeal nerve.  It gave a good signal with the stimulator.  The trachea was exposed medial to the nerve.  The lobe extended beneath the clavicle.  Using a finger sweep, the lobe was lifted out from underneath the clavicle.  We were then able to divide the inferior pole vessels with silk ties and the harmonic scalpel.  The nerve was dissected up to its insertion point at the cricothyroid muscle.  The ligament of Berry and anterior suspensory ligament were divided.  The gland was dissected off of the trachea and across the midline.  There is a modest sized pyramidal lobe that was dissected off of the underlying musculature and divided at its most superior aspect.  We then turned to the left and continued in similar fashion.  Throughout the case, exposure was  challenging secondary to the patient's obesity and short thick neck.  As a result, extensive time was spent working slowly and carefully to  ensure patient safety and hemostasis.  The left lobe was even larger than the right.  Once again, we slowly dissected the filmy strands of strap muscle off of the lobe and retracted them laterally.  The superior pole vessels were sequentially isolated and divided with silk ties and the harmonic scalpel.  The middle thyroid vein was similarly divided.  At that point, I was then able to get a finger beneath the clavicle and elevate the substernal component up into her wound.  We opened to the tracheoesophageal groove and identified the recurrent laryngeal nerve.  It gave a good signal when stimulated.  The trachea was exposed medial to the nerve and the inferior pole vessels were divided.  The nerve was dissected up to its insertion point at the cricothyroid muscle.  The ligament of Berry and all remaining attachments of the thyroid to the trachea were divided and the gland was able to be completely excised.  As I had not seen any obvious parathyroid tissue during the dissection, I carefully inspected the thyroid gland for any adherent parathyroid tissue.  I identified what was likely the left superior gland clinging to the posterior aspect of the lobe.  It was separated from the thyroid tissue and placed in cold saline to be autotransplanted at the end of the operation.  We irrigated our wound beds thoroughly.  Meticulous use of electrocautery achieved good hemostasis.  Our hemostasis was confirmed via Valsalva maneuvers from the anesthesia team.  We applied SNoW and Evicel for additional hemostatic effects.  The strap muscles were closed in the midline with running 4-0 Vicryl.  The preserve parathyroid tissue was brought into the field.  Was divided into small pieces which were then crosshatched to increase their surface area.  I made pockets in the sterno hyoid muscle.  Into each pocket, bits of crosshatched parathyroid tissue were placed.  Each pocket was closed and marked with a Ligaclip.  The platysma was  reapproximated with interrupted Vicryl and the skin was closed with running subcuticular Monocryl.  The skin was cleaned, and Dermabond and Steri-Strips were applied.  The Monocryl suture was trimmed at the skin surface.  Due to the extended length of the surgery, and in and out catheterization was performed to drain the patient's bladder.  She was then awakened, extubated, and taken to the postanesthesia care unit in good condition.  EBL: 150 cc  IVF: See anesthesia record  Specimen(s): Total thyroid to pathology for permanent section  Complications: none immediately apparent.   Counts: all needles, instruments, and sponges were counted and reported to be correct in number at the end of the case.   I was present for and participated in the entire operation.  Fredirick Maudlin 3:19 PM

## 2019-06-27 NOTE — Anesthesia Procedure Notes (Signed)
Procedure Name: Intubation Date/Time: 06/27/2019 10:37 AM Performed by: Philbert Riser, CRNA Pre-anesthesia Checklist: Patient identified, Emergency Drugs available, Suction available, Patient being monitored and Timeout performed Patient Re-evaluated:Patient Re-evaluated prior to induction Oxygen Delivery Method: Circle system utilized and Simple face mask Preoxygenation: Pre-oxygenation with 100% oxygen Induction Type: IV induction Ventilation: Oral airway inserted - appropriate to patient size and Mask ventilation with difficulty Laryngoscope Size: McGraph and 3 Grade View: Grade I Tube type: Oral Tube size: 7.0 mm Number of attempts: 1 Airway Equipment and Method: Patient positioned with wedge pillow and Stylet Placement Confirmation: ETT inserted through vocal cords under direct vision,  positive ETCO2 and breath sounds checked- equal and bilateral Secured at: 21 cm Tube secured with: Tape Dental Injury: Teeth and Oropharynx as per pre-operative assessment  Difficulty Due To: Difficulty was anticipated

## 2019-06-28 DIAGNOSIS — E0500 Thyrotoxicosis with diffuse goiter without thyrotoxic crisis or storm: Secondary | ICD-10-CM | POA: Diagnosis not present

## 2019-06-28 LAB — CALCIUM: Calcium: 8.4 mg/dL — ABNORMAL LOW (ref 8.9–10.3)

## 2019-06-28 LAB — ALBUMIN: Albumin: 2.8 g/dL — ABNORMAL LOW (ref 3.5–5.0)

## 2019-06-28 MED ORDER — IBUPROFEN 600 MG PO TABS: 600.0000 mg | ORAL_TABLET | Freq: Four times a day (QID) | ORAL | 0 refills | Status: DC | PRN

## 2019-06-28 MED ORDER — CALCIUM CARBONATE 1250 (500 CA) MG PO TABS: 2.0000 | ORAL_TABLET | Freq: Three times a day (TID) | ORAL | 0 refills | Status: DC

## 2019-06-28 MED ORDER — HYDROCODONE-ACETAMINOPHEN 5-325 MG PO TABS: 1.0000 | ORAL_TABLET | ORAL | 0 refills | Status: DC | PRN

## 2019-06-28 MED ORDER — LEVOTHYROXINE SODIUM 125 MCG PO TABS: 250.0000 ug | ORAL_TABLET | Freq: Every day | ORAL | 1 refills | Status: DC

## 2019-06-28 NOTE — Anesthesia Postprocedure Evaluation (Signed)
Anesthesia Post Note  Patient: Kimberly Nolan  Procedure(s) Performed: TOTAL THYROIDECTOMY AND PARATHYROID AUTOTRANSPLANT (N/A )  Patient location during evaluation: PACU Anesthesia Type: General Level of consciousness: awake and alert Pain management: pain level controlled Vital Signs Assessment: post-procedure vital signs reviewed and stable Respiratory status: spontaneous breathing, nonlabored ventilation and respiratory function stable Cardiovascular status: blood pressure returned to baseline and stable Postop Assessment: no apparent nausea or vomiting Anesthetic complications: no     Last Vitals:  Vitals:   06/28/19 0624 06/28/19 0807  BP: 129/68 136/69  Pulse: 89 89  Resp: 20 20  Temp: 37 C 37 C  SpO2: 98% 100%    Last Pain:  Vitals:   06/28/19 0807  TempSrc: Oral  PainSc: [name]

## 2019-06-28 NOTE — Discharge Summary (Addendum)
[facility] SURGICAL DISCHARGE SUMMARY   Patient ID: Kimberly DownsChandrike K Nolan MRN: [id number] DOB/AGE: 1986-08-29 33 y.o.  Admit date: 06/27/2019 Discharge date: 06/28/2019  Discharge Diagnoses Patient Active Problem List   Diagnosis Date Noted  . 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 Thyroiditis 02/17/2017  . Goiter 02/17/2017  . Hyperthyroidism 02/16/2017  . Graves disease 02/16/2017   Consultants None  Procedures 06/28/2019: Parathyroid Autotransplant x 1 and Total Thyroidectomy with Cervical Extraction of Substernal Component  HPI: Kimberly Nolan is a 33 y.o. female with a history of grave's disease who [facility] for scheduled thyroidectomy with Dr Lady Garyannon.   Hospital Course: Informed consent was obtained and documented, and patient underwent thyroidectomy and parathyroidectomy autotransplantation (Dr Lady Garyannon, 06/27/2019).  Post-operatively, patient's corrected calcium was within normal limits and she was otherwise doing well. Advancement of patient's diet and ambulation were well-tolerated. The remainder of patient's hospital course was essentially unremarkable, and discharge planning was initiated accordingly with patient safely able to be discharged home with appropriate discharge instructions, medications, pain control, and outpatient  follow-up after all of her questions were answered to her expressed satisfaction.  Discharge Condition: Good   Physical Examination:  Constitutional: Well appearing female, NAD HEENT: Incision is CDI with steri-strips, no erythema Pulmonary: Normal effort, no respiratory distress Skin: Warm, dry   Allergies as of 06/28/2019      Reactions   Penicillins Itching, Rash   Did it involve swelling of the face/tongue/throat, SOB, or low BP? No Did it involve sudden or severe rash/hives, skin peeling, or any reaction on the inside of your mouth or nose? No Did you need to seek medical  attention at a hospital or doctor's office? No When did it last happen?10+ years ago If all above answers are "NO", may proceed with cephalosporin use.      Medication List    STOP taking these medications   CALCIUM CITRATE PO   methimazole 10 MG tablet Commonly known as: TAPAZOLE     TAKE these medications   albuterol 108 (90 Base) MCG/ACT inhaler Commonly known as: VENTOLIN HFA Inhale 2 puffs into the lungs every 6 (six) hours as needed for wheezing or shortness of breath.   benzonatate 200 MG capsule Commonly known as: TESSALON Take 1 capsule (200 mg total) by mouth every 6 (six) hours as needed for cough.   calcium carbonate 1250 (500 Ca) MG tablet Commonly known as: OS-CAL - dosed in mg of elemental calcium Take 2 tablets (1,000 mg of elemental calcium total) by mouth 3 (three) times daily with meals.   HYDROcodone-acetaminophen 5-325 MG tablet Commonly known as: NORCO/VICODIN Take 1-2 tablets by mouth every 4 (four) hours as needed for moderate pain.   ibuprofen 600 MG tablet Commonly known as: ADVIL Take 1 tablet (600 mg total) by mouth every 6 (six) hours as needed for mild pain. What changed:   when to take this  reasons to take this  Another medication with the same name was removed. Continue taking this medication, and follow the directions you see here.   levothyroxine 125 MCG tablet Commonly known as: SYNTHROID Take 2 tablets (250 mcg total) by mouth daily at 6 (six) AM. Start taking on: June 29, 2019   methocarbamol 500 MG tablet Commonly known as: Robaxin Take 1 tablet (500 mg total) by mouth 4 (four) times daily.   propranolol 20 MG tablet Commonly known as: INDERAL TAKE 1 TABLET BY MOUTH TWICE A  DAY What changed:   when to take this  reasons to take this   Vitamin D 125 MCG (5000 UT) Caps Take 5,000 Units by mouth daily.            Discharge Care Instructions  (From admission, onward)         Start     Ordered    06/28/19 0000  Discharge wound care:    Comments: DO NOT PEEL OFF STERI-STRIPS, these will fall off on their own Okay to shower tomorrow, 08/14, do not scrub incisions, pat dry   06/28/19 0712           Follow-up Information    Fredirick Maudlin, MD. Schedule an appointment as soon as possible for a visit in 2 week(s).   Specialty: General Surgery Why: s/p thyroidectomy and parathyroid autotransplantation Contact information: [address] [place] [address] [phone]            Time spent on discharge management including discussion of hospital course, clinical condition, outpatient instructions, prescriptions, and follow up with the patient and members of the medical team: >30 minutes  -- Edison Simon , PA-C [facility]  06/28/2019, 9:47 AM [phone] M-F: 7am - 4pm   I saw and evaluated the patient.  I agree with the above documentation, exam, and plan, which I have edited where appropriate. Fredirick Maudlin  10:04 AM

## 2019-06-28 NOTE — Discharge Instructions (Signed)

## 2019-06-28 NOTE — Plan of Care (Signed)
  Problem: Clinical Measurements: Goal: Postoperative complications will be avoided or minimized Outcome: Adequate for Discharge   Problem: Skin Integrity: Goal: Demonstration of wound healing without infection will improve Outcome: Adequate for Discharge   Problem: Education: Goal: Knowledge of General Education information will improve Description: Including pain rating scale, medication(s)/side effects and non-pharmacologic comfort measures Outcome: Adequate for Discharge   Problem: Health Behavior/Discharge Planning: Goal: Ability to manage health-related needs will improve Outcome: Adequate for Discharge   Problem: Clinical Measurements: Goal: Ability to maintain clinical measurements within normal limits will improve Outcome: Adequate for Discharge Goal: Will remain free from infection Outcome: Adequate for Discharge Goal: Diagnostic test results will improve Outcome: Adequate for Discharge Goal: Respiratory complications will improve Outcome: Adequate for Discharge Goal: Cardiovascular complication will be avoided Outcome: Adequate for Discharge   Problem: Activity: Goal: Risk for activity intolerance will decrease Outcome: Adequate for Discharge   Problem: Nutrition: Goal: Adequate nutrition will be maintained Outcome: Adequate for Discharge   Problem: Coping: Goal: Level of anxiety will decrease Outcome: Adequate for Discharge   Problem: Elimination: Goal: Will not experience complications related to bowel motility Outcome: Adequate for Discharge Goal: Will not experience complications related to urinary retention Outcome: Adequate for Discharge   Problem: Pain Managment: Goal: General experience of comfort will improve Outcome: Adequate for Discharge   Problem: Safety: Goal: Ability to remain free from injury will improve Outcome: Adequate for Discharge   Problem: Skin Integrity: Goal: Risk for impaired skin integrity will decrease Outcome: Adequate  for Discharge   

## 2019-06-29 LAB — HIV ANTIBODY (ROUTINE TESTING W REFLEX): HIV Screen 4th Generation wRfx: NONREACTIVE

## 2019-06-29 LAB — SURGICAL PATHOLOGY

## 2019-06-29 NOTE — Addendum Note (Signed)
Addendum  created 06/29/19 1039 by Doreen Salvage, CRNA   Charge Capture section accepted

## 2019-07-12 ENCOUNTER — Other Ambulatory Visit: Payer: Self-pay

## 2019-07-12 ENCOUNTER — Encounter: Payer: Self-pay | Admitting: General Surgery

## 2019-07-12 ENCOUNTER — Ambulatory Visit (INDEPENDENT_AMBULATORY_CARE_PROVIDER_SITE_OTHER): Payer: Medicaid Other | Admitting: General Surgery

## 2019-07-12 VITALS — BP 167/80 | HR 93 | Temp 97.2°F | Ht 67.0 in | Wt >= 6400 oz

## 2019-07-12 DIAGNOSIS — E890 Postprocedural hypothyroidism: Secondary | ICD-10-CM

## 2019-07-12 MED ORDER — LEVOTHYROXINE SODIUM 125 MCG PO TABS: 250.0000 ug | ORAL_TABLET | Freq: Every day | ORAL | 2 refills | Status: DC

## 2019-07-12 NOTE — Progress Notes (Signed)
[name] is here today for a postoperative visit after undergoing a total thyroidectomy for presumed Graves' disease.  She reports that she is having some dysphagia to liquids, particularly when she takes a large sip; she coughs and has to spit it out.  She does better if she takes a small sip of liquid.  She says that her voice sounds a bit husky to her but is getting better since her surgery.  She says that she "did not know she was supposed to" take the prescribed calcium or vitamin D after her surgery.  Fortunately, she has not experienced any paresthesias or muscle cramping.  She says that she is taking the prescribed levothyroxine and tolerating this without difficulty.  Today's Vitals   07/12/19 1413  BP: (!) 167/80  Pulse: 93  Temp: (!) 97.2 F (36.2 C)  TempSrc: Temporal  SpO2: 99%  Weight: (!) 414 lb (187.8 kg)  Height: 5\' 7"  (1.702 m)   Body mass index is 64.84 kg/m. Voice: Her voice has a normal quality to it.  There is no breathiness or hoarseness. Focused cervical examination: The transverse thyroidectomy scar is healing nicely.  There is no erythema induration or drainage identified.  There is a small amount of normal postoperative swelling present.  She has a faint healing ridge beneath the surface.  Pathology: SPECIMEN SUBMITTED:  A. Total thyroid   CLINICAL HISTORY:  None provided   PRE-OPERATIVE DIAGNOSIS:  Graves disease   POST-OPERATIVE DIAGNOSIS:  Same as pre-op     DIAGNOSIS:  A. THYROID GLAND; TOTAL THYROIDECTOMY:  - HYPERTROPHIC THYROID TISSUE WITH ABUNDANT COLLOID.  - SMALL FRAGMENT OF BENIGN PARATHYROID TISSUE IN RIGHT INFERIOR POLE.  - NEGATIVE FOR ATYPIA AND MALIGNANCY.   Comment:  Sections show enlarged thyroid gland without significant nodularity.  Enlarged follicles are seen throughout the gland, with abundant colloid.  The typical hyperplastic features and colloid scalloping typically  associated with Graves' disease are not identified.  There is focal  fibrosis/scarring and chronic inflammation, suggestive of prior biopsy  site. Clinical correlation is recommended.   Impression and plan: Kimberly Nolan is a 33 year old woman who had a total thyroidectomy for hyperthyroidism (presumed Graves' disease).  She did not take her prescribed calcium supplementation, but fortunately has not experienced any symptoms related to it.  Her voice sounds normal.  She was provided with strategies to improve her liquid dysphagia including taking smaller sips, tucking her chin, or turning her head to one side as she swallows.  I anticipate that this will improve as the swelling and inflammation from her surgery resolved.  We renewed her levothyroxine prescription.  She was given instructions for scar massage to minimize tethering and improve the overall cosmesis.  She was also cautioned against ultraviolet light exposure for 1 year to avoid abnormal pigmentation.  At this time, she has no ongoing endocrine surgical needs.  She should follow-up with Dr. Gabriel Carina in the next couple of weeks to have her thyroid levels evaluated and her levothyroxine dose adjusted as needed.  We have not made any scheduled follow-up appointment with [place], however she is welcome to return if any questions or concerns arise.

## 2019-07-12 NOTE — Patient Instructions (Addendum)
The patient is aware to call back for any questions or new concerns.  Follow up with Dr Gabriel Carina at [facility] for appointment  continue Synthroid medication

## 2019-07-13 ENCOUNTER — Encounter: Payer: Self-pay | Admitting: General Surgery

## 2019-07-24 DIAGNOSIS — E890 Postprocedural hypothyroidism: Secondary | ICD-10-CM | POA: Insufficient documentation

## 2019-09-05 ENCOUNTER — Encounter: Payer: Self-pay | Admitting: Physician Assistant

## 2019-09-05 ENCOUNTER — Other Ambulatory Visit: Payer: Self-pay

## 2019-09-05 ENCOUNTER — Ambulatory Visit: Payer: Medicaid Other | Admitting: Physician Assistant

## 2019-09-05 VITALS — BP 148/85 | HR 90 | Temp 97.6°F | Resp 20 | Ht 67.0 in | Wt >= 6400 oz

## 2019-09-05 DIAGNOSIS — M545 Low back pain, unspecified: Secondary | ICD-10-CM

## 2019-09-05 DIAGNOSIS — R030 Elevated blood-pressure reading, without diagnosis of hypertension: Secondary | ICD-10-CM

## 2019-09-05 DIAGNOSIS — E0590 Thyrotoxicosis, unspecified without thyrotoxic crisis or storm: Secondary | ICD-10-CM | POA: Diagnosis not present

## 2019-09-05 DIAGNOSIS — G8929 Other chronic pain: Secondary | ICD-10-CM

## 2019-09-05 MED ORDER — TOPIRAMATE 25 MG PO TABS: ORAL_TABLET | ORAL | 0 refills | Status: DC

## 2019-09-05 MED ORDER — MELOXICAM 7.5 MG PO TABS: 7.5000 mg | ORAL_TABLET | Freq: Every day | ORAL | 0 refills | Status: DC

## 2019-09-05 NOTE — Progress Notes (Signed)
Patient: Kimberly Nolan Female    DOB: 22-May-1986   33 y.o.   MRN: [id number] Visit Date: 09/05/2019  Today's Provider: Trey SailorsAdriana M Keil Pickering, PA-C   Chief Complaint  Patient presents with  . Back Pain   Subjective:     HPI   Patient here today c/o recurrent and worsening back pain ongoing for several years. This has been a chronic issue. She first brought this issue up in 07/02/2017. Referral was made at that time but appointment was not scheduled until 2020.    Patient reports that she has been seen at the ER and [facility]. Patient reports that she has had PT and pain is not improving. Patient reports that an MRI has been ordered but no one has been able to give her information about the MRI. The MRI was ordered on 08/20/2019. She reports she called two weeks ago and nobody knew what was going on. She has not called since then.  Exam from [facility] visit showed decreased ROM in her low back to normal ROM in her hips, good strength in hamstrings and quads bilaterally.   Patient now c/o leg pain and numbness. Patient reports numbness on and off in her Pinky. Patient reports that she is have problems even wiping herself. Patient reports difficulty dressing and bathing herself.   Morbid Obesity: patient would like to talk about her weight. She says she hardly eats and keeps gaining weight. She says she is on a vegetarian diet. She does not exercise because she says it is too painful. She does not often leave the house. A female who has accompanied her to the visit today says that he eats 3xs the calories the patient eats and she is much bigger.   Wt Readings from Last 3 Encounters:  09/05/19 (!) 433 lb (196.4 kg)  07/12/19 (!) 414 lb (187.8 kg)  06/27/19 (!) 400 lb (181.4 kg)   In the interim, she has had her thyroid removed for grave's hyperthyroidism. It was removed prophylactically due to chronic non-compliance. She is currently on 125 mcg synthroid daily and will follow up  with Dr. Tedd SiasSolum in Endocrinology in 09/2019. She is no longer taking her propranolol. She reports she received mixed signals and was told she would not need it after her thyroid was removed.     Allergies  Allergen Reactions  . Penicillins Itching and Rash    Did it involve swelling of the face/tongue/throat, SOB, or low BP? No Did it involve sudden or severe rash/hives, skin peeling, or any reaction on the inside of your mouth or nose? No Did you need to seek medical attention at a hospital or doctor's office? No When did it last happen?10+ years ago If all above answers are "NO", may proceed with cephalosporin use.      Current Outpatient Medications:  .  levothyroxine (SYNTHROID) 125 MCG tablet, Take 2 tablets (250 mcg total) by mouth daily before breakfast., Disp: 60 tablet, Rfl: 2 .  meloxicam (MOBIC) 7.5 MG tablet, Take 1 tablet (7.5 mg total) by mouth daily., Disp: 30 tablet, Rfl: 0 .  topiramate (TOPAMAX) 25 MG tablet, Take 25 mg before bed for one week. Then take 50 mg before bed for one week. Then 75 mg before bed for one week. Then 1000 mg before bed onward., Disp: 180 tablet, Rfl: 0 .  albuterol (PROVENTIL HFA;VENTOLIN HFA) 108 (90 Base) MCG/ACT inhaler, Inhale 2 puffs into the lungs every 6 (six) hours as  needed for wheezing or shortness of breath. (Patient not taking: Reported on 09/05/2019), Disp: 1 Inhaler, Rfl: 0 .  levothyroxine (SYNTHROID) 125 MCG tablet, Take 2 tablets (250 mcg total) by mouth daily at 6 (six) AM., Disp: 30 tablet, Rfl: 1 .  propranolol (INDERAL) 20 MG tablet, TAKE 1 TABLET BY MOUTH TWICE A DAY (Patient not taking: No sig reported), Disp: 180 tablet, Rfl: 0  Review of Systems  Constitutional: Positive for activity change.  Cardiovascular: Negative.   Musculoskeletal: Positive for gait problem.  Neurological: Positive for weakness and numbness.    Social History   Tobacco Use  . Smoking status: Current Every Day Smoker    Packs/day: 0.50     Years: 3.00    Pack years: 1.50    Types: Cigarettes  . Smokeless tobacco: Never Used  Substance Use Topics  . Alcohol use: Not Currently    Comment: Occasionally      Objective:   BP (!) 148/85 (BP Location: Right Arm, Patient Position: Sitting, Cuff Size: Large)   Pulse 90   Temp 97.6 F (36.4 C) (Temporal)   Resp 20   Ht 5\' 7"  (1.702 m)   Wt (!) 433 lb (196.4 kg)   BMI 67.82 kg/m  Vitals:   09/05/19 1509  BP: (!) 148/85  Pulse: 90  Resp: 20  Temp: 97.6 F (36.4 C)  TempSrc: Temporal  Weight: (!) 433 lb (196.4 kg)  Height: 5\' 7"  (1.702 m)  Body mass index is 67.82 kg/m.   Physical Exam Constitutional:      Appearance: Normal appearance. She is obese.  Cardiovascular:     Rate and Rhythm: Normal rate.  Pulmonary:     Effort: Pulmonary effort is normal.  Skin:    General: Skin is warm and dry.  Neurological:     Mental Status: She is alert and oriented to person, place, and time. Mental status is at baseline.      No results found for any visits on 09/05/19.     Assessment & Plan    1. Chronic bilateral low back pain without sciatica  Patient presenting today requesting a referral for another orthopedist. I have advised her tha it would be in her best interest to follow up with the orthopedist who is currently in the process of getting her an MRI. Counseled that it is common for an MRI to take 4-6 weeks to be ordered and scheduled. I suspect her back pain and limited mobility is related to her morbid obesity as she has gained ~ 100 lbs in the past two years.   - meloxicam (MOBIC) 7.5 MG tablet; Take 1 tablet (7.5 mg total) by mouth daily.  Dispense: 30 tablet; Refill: 0  2. Morbid obesity ([place])  Patient reports she hardly eats, her partner says he eats 3x as much as she does. Counseled that 90% obesity is related to calorie excess. I have counseled people often vastly underestimate the calories they are consuming. I have told her partner it is highly,  highly unlikely that he is eating 3x the calories she eats. Discussed reducing calories and increasing exercise. I believe her obesity is limiting her mobility. She presents today in a wheelchair but so far based on her normal exam from [facility] and lack of findings, I see no physiologic reason why she shouldn't walk. She wants to know if there are medications for weight loss. Counseled medications are not a substitute for diet and exercise. Start topamax as below. Counseled on side  effects including sedation and paraesthesias.   - topiramate (TOPAMAX) 25 MG tablet; Take 25 mg before bed for one week. Then take 50 mg before bed for one week. Then 75 mg before bed for one week. Then 1000 mg before bed onward.  Dispense: 180 tablet; Refill: 0  3. Hyperthyroidism  She will get this checked with Dr. Tedd Sias in November.   4. Elevated blood pressure reading  Counseled that her BP has decreased however she may have hypertension independent of her thyroid.  The entirety of the information documented in the History of Present Illness, Review of Systems and Physical Exam were personally obtained by me. Portions of this information were initially documented by Rondel Baton, CMA and reviewed by me for thoroughness and accuracy.        Trey Sailors, PA-C  [facility]

## 2019-09-06 NOTE — Patient Instructions (Signed)

## 2019-10-08 ENCOUNTER — Ambulatory Visit: Payer: Self-pay | Admitting: Physician Assistant

## 2019-10-08 ENCOUNTER — Telehealth: Payer: Self-pay

## 2019-10-08 MED ORDER — TOPIRAMATE 25 MG PO TABS: ORAL_TABLET | ORAL | 0 refills | Status: DC

## 2019-10-08 NOTE — Telephone Encounter (Signed)
Patient states that she was advised of the medication been received and states that she will let the office know if the medication is filled or not.

## 2019-10-08 NOTE — Progress Notes (Deleted)
{Method of visit:23308}  Patient: Kimberly Nolan Female    DOB: [birth year]   33 y.o.   MRN: [id number] Visit Date: 10/08/2019  Today's Provider: Trinna Post, PA-C   No chief complaint on file.  Subjective:    Virtual Visit via Telephone Note  I connected with [place] on 10/08/19 at  1:20 PM EST by telephone and verified that I am speaking with the correct person using two identifiers.  Location: Patient: *** Provider: ***   I discussed the limitations, risks, security and privacy concerns of performing an evaluation and management service by telephone and the availability of in person appointments. I also discussed with the patient that there may be a patient responsible charge related to this service. The patient expressed understanding and agreed to proceed.  HPI    Allergies  Allergen Reactions  . Penicillins Itching and Rash    Did it involve swelling of the face/tongue/throat, SOB, or low BP? No Did it involve sudden or severe rash/hives, skin peeling, or any reaction on the inside of your mouth or nose? No Did you need to seek medical attention at a hospital or doctor's office? No When did it last happen?10+ years ago If all above answers are "NO", may proceed with cephalosporin use.      Current Outpatient Medications:  .  albuterol (PROVENTIL HFA;VENTOLIN HFA) 108 (90 Base) MCG/ACT inhaler, Inhale 2 puffs into the lungs every 6 (six) hours as needed for wheezing or shortness of breath. (Patient not taking: Reported on 09/05/2019), Disp: 1 Inhaler, Rfl: 0 .  levothyroxine (SYNTHROID) 125 MCG tablet, Take 2 tablets (250 mcg total) by mouth daily at 6 (six) AM., Disp: 30 tablet, Rfl: 1 .  levothyroxine (SYNTHROID) 125 MCG tablet, Take 2 tablets (250 mcg total) by mouth daily before breakfast., Disp: 60 tablet, Rfl: 2 .  meloxicam (MOBIC) 7.5 MG tablet, Take 1 tablet (7.5 mg total) by mouth daily., Disp: 30 tablet, Rfl: 0 .  propranolol  (INDERAL) 20 MG tablet, TAKE 1 TABLET BY MOUTH TWICE A DAY (Patient not taking: No sig reported), Disp: 180 tablet, Rfl: 0 .  topiramate (TOPAMAX) 25 MG tablet, Take 25 mg before bed for one week. Then take 50 mg before bed for one week. Then 75 mg before bed for one week. Then 1000 mg before bed onward., Disp: 180 tablet, Rfl: 0  Review of Systems  Constitutional: Negative.   Respiratory: Negative.   Genitourinary: Negative.   Hematological: Negative.     Social History   Tobacco Use  . Smoking status: Current Every Day Smoker    Packs/day: 0.50    Years: 3.00    Pack years: 1.50    Types: Cigarettes  . Smokeless tobacco: Never Used  Substance Use Topics  . Alcohol use: Not Currently    Comment: Occasionally      Objective:   There were no vitals taken for this visit. There were no vitals filed for this visit.There is no height or weight on file to calculate BMI.   Physical Exam   No results found for any visits on 10/08/19.     Assessment & Plan    I discussed the assessment and treatment plan with the patient. The patient was provided an opportunity to ask questions and all were answered. The patient agreed with the plan and demonstrated an understanding of the instructions.   The patient was advised to call back or seek an in-person evaluation if the  symptoms worsen or if the condition fails to improve as anticipated.  I provided *** minutes of non-face-to-face time during this encounter.     Trey Sailors, PA-C  [facility]

## 2019-10-08 NOTE — Telephone Encounter (Signed)
Called patient to do her 1:20 PM visit on 10/08/2019. She cancelled the appointment when the medical assistant called to check in because she never got the medication she was prescribed one month ago. I will resend the medication. This will be counted as a no-show.

## 2019-10-08 NOTE — Telephone Encounter (Signed)
Patient states that she cancelled her appointment due to not been able to get the Topiramate 25 MG. Patient states that she did not want to waste Adriana time on appointment. FYI

## 2019-10-17 ENCOUNTER — Other Ambulatory Visit: Payer: Self-pay

## 2019-10-17 ENCOUNTER — Encounter: Payer: Self-pay | Admitting: Emergency Medicine

## 2019-10-17 ENCOUNTER — Emergency Department: Payer: Medicaid Other

## 2019-10-17 DIAGNOSIS — Z5321 Procedure and treatment not carried out due to patient leaving prior to being seen by health care provider: Secondary | ICD-10-CM | POA: Insufficient documentation

## 2019-10-17 DIAGNOSIS — M79605 Pain in left leg: Secondary | ICD-10-CM | POA: Insufficient documentation

## 2019-10-17 NOTE — ED Triage Notes (Addendum)
Pt to triage via w/c with no distress noted, mask in place; pt reports x 3-4 days having pain to left calf; denies any known injury; denies any other c/o or accomp symptoms; diff to assess swelling due to pt's obesity but is comparable to other leg; st pain increases with weight bearing

## 2019-10-18 ENCOUNTER — Other Ambulatory Visit: Payer: Self-pay

## 2019-10-18 ENCOUNTER — Emergency Department: Payer: Medicaid Other

## 2019-10-18 ENCOUNTER — Inpatient Hospital Stay
Admission: EM | Admit: 2019-10-18 | Discharge: 2019-10-21 | DRG: 176 | Disposition: A | Discharge status: Home / Self Care | Payer: Medicaid Other | Attending: Internal Medicine | Admitting: Internal Medicine

## 2019-10-18 ENCOUNTER — Emergency Department
Admission: EM | Admit: 2019-10-18 | Discharge: 2019-10-18 | Disposition: A | Discharge status: Home / Self Care | Payer: Medicaid Other | Source: Home / Self Care

## 2019-10-18 DIAGNOSIS — Z20828 Contact with and (suspected) exposure to other viral communicable diseases: Secondary | ICD-10-CM | POA: Diagnosis present

## 2019-10-18 DIAGNOSIS — Z6841 Body Mass Index (BMI) 40.0 and over, adult: Secondary | ICD-10-CM

## 2019-10-18 DIAGNOSIS — Z79890 Hormone replacement therapy (postmenopausal): Secondary | ICD-10-CM

## 2019-10-18 DIAGNOSIS — D6859 Other primary thrombophilia: Secondary | ICD-10-CM | POA: Diagnosis present

## 2019-10-18 DIAGNOSIS — Z66 Do not resuscitate: Secondary | ICD-10-CM

## 2019-10-18 DIAGNOSIS — Z791 Long term (current) use of non-steroidal anti-inflammatories (NSAID): Secondary | ICD-10-CM

## 2019-10-18 DIAGNOSIS — F17210 Nicotine dependence, cigarettes, uncomplicated: Secondary | ICD-10-CM | POA: Diagnosis present

## 2019-10-18 DIAGNOSIS — Z8349 Family history of other endocrine, nutritional and metabolic diseases: Secondary | ICD-10-CM

## 2019-10-18 DIAGNOSIS — Z880 Allergy status to penicillin: Secondary | ICD-10-CM

## 2019-10-18 DIAGNOSIS — I82432 Acute embolism and thrombosis of left popliteal vein: Secondary | ICD-10-CM

## 2019-10-18 DIAGNOSIS — I82409 Acute embolism and thrombosis of unspecified deep veins of unspecified lower extremity: Secondary | ICD-10-CM

## 2019-10-18 DIAGNOSIS — Z8249 Family history of ischemic heart disease and other diseases of the circulatory system: Secondary | ICD-10-CM

## 2019-10-18 DIAGNOSIS — M79662 Pain in left lower leg: Secondary | ICD-10-CM | POA: Diagnosis present

## 2019-10-18 DIAGNOSIS — Z823 Family history of stroke: Secondary | ICD-10-CM

## 2019-10-18 DIAGNOSIS — E890 Postprocedural hypothyroidism: Secondary | ICD-10-CM

## 2019-10-18 DIAGNOSIS — E0500 Thyrotoxicosis with diffuse goiter without thyrotoxic crisis or storm: Secondary | ICD-10-CM | POA: Diagnosis present

## 2019-10-18 DIAGNOSIS — Z79899 Other long term (current) drug therapy: Secondary | ICD-10-CM

## 2019-10-18 DIAGNOSIS — Z23 Encounter for immunization: Secondary | ICD-10-CM

## 2019-10-18 DIAGNOSIS — G8929 Other chronic pain: Secondary | ICD-10-CM | POA: Diagnosis present

## 2019-10-18 DIAGNOSIS — I2609 Other pulmonary embolism with acute cor pulmonale: Secondary | ICD-10-CM

## 2019-10-18 DIAGNOSIS — I10 Essential (primary) hypertension: Secondary | ICD-10-CM | POA: Diagnosis present

## 2019-10-18 DIAGNOSIS — Z833 Family history of diabetes mellitus: Secondary | ICD-10-CM

## 2019-10-18 DIAGNOSIS — Z9114 Patient's other noncompliance with medication regimen: Secondary | ICD-10-CM

## 2019-10-18 DIAGNOSIS — I2699 Other pulmonary embolism without acute cor pulmonale: Secondary | ICD-10-CM | POA: Diagnosis present

## 2019-10-18 DIAGNOSIS — D649 Anemia, unspecified: Secondary | ICD-10-CM | POA: Diagnosis present

## 2019-10-18 LAB — COMPREHENSIVE METABOLIC PANEL
ALT: 13 U/L (ref 0–44)
AST: 13 U/L — ABNORMAL LOW (ref 15–41)
Albumin: 3.3 g/dL — ABNORMAL LOW (ref 3.5–5.0)
Alkaline Phosphatase: 85 U/L (ref 38–126)
Anion gap: 9 (ref 5–15)
BUN: 11 mg/dL (ref 6–20)
CO2: 24 mmol/L (ref 22–32)
Calcium: 8.3 mg/dL — ABNORMAL LOW (ref 8.9–10.3)
Chloride: 103 mmol/L (ref 98–111)
Creatinine, Ser: 0.82 mg/dL (ref 0.44–1.00)
GFR calc Af Amer: >60 mL/min (ref >60)
GFR calc non Af Amer: >60 mL/min (ref >60)
Glucose, Bld: 129 mg/dL — ABNORMAL HIGH (ref 70–99)
Potassium: 3.5 mmol/L (ref 3.5–5.1)
Sodium: 136 mmol/L (ref 135–145)
Total Bilirubin: 0.7 mg/dL (ref 0.3–1.2)
Total Protein: 8.1 g/dL (ref 6.5–8.1)

## 2019-10-18 LAB — TROPONIN I (HIGH SENSITIVITY): Troponin I (High Sensitivity): 12 ng/L (ref <18)

## 2019-10-18 MED ORDER — SODIUM CHLORIDE 0.9% FLUSH
10.0000 mL | Freq: Two times a day (BID) | INTRAVENOUS | Status: DC
  Administered 2019-10-19 – 2019-10-21 (×5): 10 mL

## 2019-10-18 MED ORDER — MORPHINE SULFATE (PF) 4 MG/ML IV SOLN
4.0000 mg | Freq: Once | INTRAVENOUS | Status: AC
  Administered 2019-10-18: 4 mg via INTRAVENOUS
  Filled 2019-10-18: qty 1

## 2019-10-18 MED ORDER — RIVAROXABAN (XARELTO) VTE STARTER PACK (15 & 20 MG): ORAL_TABLET | ORAL | 0 refills | Status: DC

## 2019-10-18 MED ORDER — IOHEXOL 350 MG/ML SOLN
100.0000 mL | Freq: Once | INTRAVENOUS | Status: AC | PRN
  Administered 2019-10-18: 100 mL via INTRAVENOUS

## 2019-10-18 MED ORDER — OXYCODONE-ACETAMINOPHEN 5-325 MG PO TABS: 1.0000 | ORAL_TABLET | ORAL | 0 refills | Status: DC | PRN

## 2019-10-18 MED ORDER — ONDANSETRON HCL 4 MG/2ML IJ SOLN
4.0000 mg | Freq: Once | INTRAMUSCULAR | Status: AC
  Administered 2019-10-18: 4 mg via INTRAVENOUS
  Filled 2019-10-18: qty 2

## 2019-10-18 MED ORDER — SODIUM CHLORIDE 0.9% FLUSH
10.0000 mL | INTRAVENOUS | Status: DC | PRN
  Administered 2019-10-20: 10 mL
  Filled 2019-10-18: qty 40

## 2019-10-18 NOTE — ED Triage Notes (Signed)
First Nurse Note:  SEnt from [facility] for DVT treatment.  Patient presented to ED last night for same, had [place] and was determined to have a DVT by [place]. Patient LWBS.  Returns this evening AAOx3.  Skin warm and dry.  No SOB? DOE.  NAD

## 2019-10-18 NOTE — ED Notes (Signed)
Attempted to start IV and draw blood. Unsuccessful. IV team consulted.

## 2019-10-18 NOTE — ED Notes (Signed)
IV team in room to attempt IV start

## 2019-10-18 NOTE — ED Provider Notes (Signed)
[facility] Emergency Department Provider Note  Time seen: 7:54 PM  I have reviewed the triage vital signs and the nursing notes.   HISTORY  Chief Complaint Left leg pain  HPI Kimberly Nolan is a 33 y.o. female with a past medical history of hypertension, obesity, peripheral edema presents to the emergency department for left leg pain.  According to the patient for the past 4 days she has been experiencing left leg pain, was sent for an ultrasound which was performed  yesterday but the patient left before resulted.  Patient called back today for positive DVT.  The patient on review of systems does states she has been experiencing some chest pain worse with deep inspiration for the past 1 day.  Denies any significant shortness of breath.  Patient states moderate pain in her left leg behind her left knee.  No right leg discomfort.  Past Medical History:  Diagnosis Date  . Anginal pain ([place])   . Dyspnea   . Essential hypertension   . Family history of adverse reaction to anesthesia    Mother slow to wake up after surgery  . Graves disease   . Lower extremity edema   . Morbid obesity ([place])   . Smoker     Patient Active Problem List   Diagnosis Date Noted  . 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 Thyroiditis 02/17/2017  . Goiter 02/17/2017  . Hyperglycemia 02/17/2017  . Prediabetes 02/17/2017  . Morbid obesity ([place]) 02/17/2017  . Hyperthyroidism 02/16/2017  . Graves disease 02/16/2017  . Essential hypertension 02/16/2017  . Current every day smoker 02/27/2015  . Morbid obesity with BMI of 50.0-59.9, adult ([place]) 02/27/2015  . Shortness of breath 09/13/2012  . CAP (community acquired pneumonia) 09/13/2012  . 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 Leukocytosis 09/13/2012  . Palpitations 09/13/2012    Past Surgical History:  Procedure Laterality Date  . CESAREAN SECTION    . PARATHYROIDECTOMY  06/27/2019   Procedure:  PARATHYROIDECTOMY AUTOTRANSPLANT;  Surgeon: Fredirick Maudlin, MD;  Location: [place] [facility];  Service: General;;  . THYROIDECTOMY N/A 06/27/2019   Procedure: TOTAL THYROIDECTOMY ;  Surgeon: Fredirick Maudlin, MD;  Location: [facility];  Service: General;  Laterality: N/A;    Prior to Admission medications   Medication Sig Start Date End Date Taking? Authorizing Provider  albuterol (PROVENTIL HFA;VENTOLIN HFA) 108 (90 Base) MCG/ACT inhaler Inhale 2 puffs into the lungs every 6 (six) hours as needed for wheezing or shortness of breath. Patient not taking: Reported on 09/05/2019 09/17/18   Darel Hong, MD  levothyroxine (SYNTHROID) 125 MCG tablet Take 2 tablets (250 mcg total) by mouth daily at 6 (six) AM. 06/29/19 07/29/19  Tylene Fantasia, PA-C  levothyroxine (SYNTHROID) 125 MCG tablet Take 2 tablets (250 mcg total) by mouth daily before breakfast. 07/12/19 10/10/19  Fredirick Maudlin, MD  meloxicam (MOBIC) 7.5 MG tablet Take 1 tablet (7.5 mg total) by mouth daily. 09/05/19   Trinna Post, PA-C  propranolol (INDERAL) 20 MG tablet TAKE 1 TABLET BY MOUTH TWICE A DAY Patient not taking: No sig reported 04/11/19   Trinna Post, PA-C  topiramate (TOPAMAX) 25 MG tablet Take 25 mg before bed for one week. Then take 50 mg before bed for one week. Then 75 mg before bed for one week. Then 1000 mg before bed onward. 10/08/19   Trinna Post, PA-C    Allergies  Allergen Reactions  . Penicillins Itching and Rash  Did it involve swelling of the face/tongue/throat, SOB, or low BP? No Did it involve sudden or severe rash/hives, skin peeling, or any reaction on the inside of your mouth or nose? No Did you need to seek medical attention at a hospital or doctor's office? No When did it last happen?10+ years ago If all above answers are "NO", may proceed with cephalosporin use.     Family History  Problem Relation Age of Onset  . Thyroid disease Mother   . Hypertension Father   . Stroke Father    . Diabetes Brother   . Hypertension Brother     Social History Social History   Tobacco Use  . Smoking status: Current Every Day Smoker    Packs/day: 0.50    Years: 3.00    Pack years: 1.50    Types: Cigarettes  . Smokeless tobacco: Never Used  Substance Use Topics  . Alcohol use: Not Currently    Comment: Occasionally  . Drug use: No    Review of Systems Constitutional: Negative for fever. Cardiovascular: Slight chest pain with deep inspiration Respiratory: Negative for shortness of breath.  Occasional cough. Gastrointestinal: Negative for abdominal pain Musculoskeletal: Left leg pain Neurological: Negative for headache All other ROS negative  ____________________________________________   PHYSICAL EXAM:  VITAL SIGNS: ED Triage Vitals [10/18/19 1853]  Enc Vitals Group     BP (!) 136/91     Pulse Rate (!) 116     Resp (!) 22     Temp 100 F (37.8 C)     Temp Source Oral     SpO2 97 %     Weight (!) 420 lb (190.5 kg)     Height 5\' 7"  (1.702 m)     Head Circumference      Peak Flow      Pain Score      Pain Loc      Pain Edu?      Excl. in GC?    Constitutional: Alert and oriented. Well appearing and in no distress. Eyes: Normal exam ENT      Head: Normocephalic and atraumatic      Mouth/Throat: Mucous membranes are moist. Cardiovascular: Normal rate, regular rhythm.  Respiratory: Normal respiratory effort without tachypnea nor retractions. Breath sounds are clear Gastrointestinal: Soft and nontender. No distention.  Musculoskeletal: Left popliteal tenderness. Neurologic:  Normal speech and language. No gross focal neurologic deficits Skin:  Skin is warm, dry and intact.  Psychiatric: Mood and affect are normal.   ____________________________________________     RADIOLOGY  CTA pending  ____________________________________________   INITIAL IMPRESSION / ASSESSMENT AND PLAN / ED COURSE  Pertinent labs & imaging results that were available  during my care of the patient were reviewed by me and considered in my medical decision making (see chart for details).   Patient presents to the emergency department, I reviewed the ultrasound showing left lower extremity DVT in the popliteal area.  However in review of systems questioning patient does state pain to the chest with deep inspiration.  Given her known DVT with pleuritic pain we will proceed with CT imaging of the chest.  Patient does have slight tachycardia as well as a borderline temperature.  Regardless patient will need anticoagulation if she is able to be discharged home.  Labs and CTA pending.  Patient care signed out to oncoming physician.  Kimberly Nolan was evaluated in Emergency Department on 10/18/2019 for the symptoms described in the history of present illness. She was  evaluated in the context of the global COVID-19 pandemic, which necessitated consideration that the patient might be at risk for infection with the SARS-CoV-2 virus that causes COVID-19. Institutional protocols and algorithms that pertain to the evaluation of patients at risk for COVID-19 are in a state of rapid change based on information released by regulatory bodies including the CDC and federal and state organizations. These policies and algorithms were followed during the patient's care in the ED.  ____________________________________________   FINAL CLINICAL IMPRESSION(S) / ED DIAGNOSES  Deep venous thrombosis   Minna Antis, MD 10/18/19 2320

## 2019-10-19 ENCOUNTER — Inpatient Hospital Stay
Admit: 2019-10-19 | Discharge: 2019-10-19 | Disposition: A | Discharge status: Home / Self Care | Payer: Medicaid Other | Attending: Family Medicine | Admitting: Family Medicine

## 2019-10-19 DIAGNOSIS — Z880 Allergy status to penicillin: Secondary | ICD-10-CM | POA: Diagnosis not present

## 2019-10-19 DIAGNOSIS — E039 Hypothyroidism, unspecified: Secondary | ICD-10-CM | POA: Diagnosis not present

## 2019-10-19 DIAGNOSIS — Z79890 Hormone replacement therapy (postmenopausal): Secondary | ICD-10-CM | POA: Diagnosis not present

## 2019-10-19 DIAGNOSIS — Z66 Do not resuscitate: Secondary | ICD-10-CM

## 2019-10-19 DIAGNOSIS — D649 Anemia, unspecified: Secondary | ICD-10-CM | POA: Diagnosis present

## 2019-10-19 DIAGNOSIS — I2699 Other pulmonary embolism without acute cor pulmonale: Secondary | ICD-10-CM | POA: Diagnosis present

## 2019-10-19 DIAGNOSIS — Z6841 Body Mass Index (BMI) 40.0 and over, adult: Secondary | ICD-10-CM

## 2019-10-19 DIAGNOSIS — I10 Essential (primary) hypertension: Secondary | ICD-10-CM | POA: Diagnosis present

## 2019-10-19 DIAGNOSIS — Z8349 Family history of other endocrine, nutritional and metabolic diseases: Secondary | ICD-10-CM | POA: Diagnosis not present

## 2019-10-19 DIAGNOSIS — Z79899 Other long term (current) drug therapy: Secondary | ICD-10-CM | POA: Diagnosis not present

## 2019-10-19 DIAGNOSIS — I82409 Acute embolism and thrombosis of unspecified deep veins of unspecified lower extremity: Secondary | ICD-10-CM

## 2019-10-19 DIAGNOSIS — M79662 Pain in left lower leg: Secondary | ICD-10-CM | POA: Diagnosis present

## 2019-10-19 DIAGNOSIS — I2609 Other pulmonary embolism with acute cor pulmonale: Secondary | ICD-10-CM | POA: Diagnosis not present

## 2019-10-19 DIAGNOSIS — E890 Postprocedural hypothyroidism: Secondary | ICD-10-CM | POA: Diagnosis present

## 2019-10-19 DIAGNOSIS — E0500 Thyrotoxicosis with diffuse goiter without thyrotoxic crisis or storm: Secondary | ICD-10-CM | POA: Diagnosis present

## 2019-10-19 DIAGNOSIS — I82432 Acute embolism and thrombosis of left popliteal vein: Secondary | ICD-10-CM | POA: Diagnosis present

## 2019-10-19 DIAGNOSIS — D6859 Other primary thrombophilia: Secondary | ICD-10-CM | POA: Diagnosis present

## 2019-10-19 DIAGNOSIS — F17210 Nicotine dependence, cigarettes, uncomplicated: Secondary | ICD-10-CM | POA: Diagnosis present

## 2019-10-19 DIAGNOSIS — Z823 Family history of stroke: Secondary | ICD-10-CM | POA: Diagnosis not present

## 2019-10-19 DIAGNOSIS — G8929 Other chronic pain: Secondary | ICD-10-CM | POA: Diagnosis present

## 2019-10-19 DIAGNOSIS — Z9114 Patient's other noncompliance with medication regimen: Secondary | ICD-10-CM | POA: Diagnosis not present

## 2019-10-19 DIAGNOSIS — Z23 Encounter for immunization: Secondary | ICD-10-CM | POA: Diagnosis not present

## 2019-10-19 DIAGNOSIS — Z833 Family history of diabetes mellitus: Secondary | ICD-10-CM | POA: Diagnosis not present

## 2019-10-19 DIAGNOSIS — Z20828 Contact with and (suspected) exposure to other viral communicable diseases: Secondary | ICD-10-CM | POA: Diagnosis present

## 2019-10-19 DIAGNOSIS — Z8249 Family history of ischemic heart disease and other diseases of the circulatory system: Secondary | ICD-10-CM | POA: Diagnosis not present

## 2019-10-19 DIAGNOSIS — Z791 Long term (current) use of non-steroidal anti-inflammatories (NSAID): Secondary | ICD-10-CM | POA: Diagnosis not present

## 2019-10-19 LAB — CBC
HCT: 36.5 % (ref 36.0–46.0)
HCT: 36.9 % (ref 36.0–46.0)
HCT: 33.6 % — ABNORMAL LOW (ref 36.0–46.0)
Hemoglobin: 11.5 g/dL — ABNORMAL LOW (ref 12.0–15.0)
Hemoglobin: 12 g/dL (ref 12.0–15.0)
Hemoglobin: 10.8 g/dL — ABNORMAL LOW (ref 12.0–15.0)
MCH: 26.9 pg (ref 26.0–34.0)
MCH: 27 pg (ref 26.0–34.0)
MCH: 27.1 pg (ref 26.0–34.0)
MCHC: 31.5 g/dL (ref 30.0–36.0)
MCHC: 32.5 g/dL (ref 30.0–36.0)
MCHC: 32.1 g/dL (ref 30.0–36.0)
MCV: 85.3 fL (ref 80.0–100.0)
MCV: 83.1 fL (ref 80.0–100.0)
MCV: 84.2 fL (ref 80.0–100.0)
Platelets: 233 10*3/uL (ref 150–400)
Platelets: 227 10*3/uL (ref 150–400)
Platelets: 236 10*3/uL (ref 150–400)
RBC: 4.28 MIL/uL (ref 3.87–5.11)
RBC: 4.44 MIL/uL (ref 3.87–5.11)
RBC: 3.99 MIL/uL (ref 3.87–5.11)
RDW: 14.2 % (ref 11.5–15.5)
RDW: 14.2 % (ref 11.5–15.5)
RDW: 14.3 % (ref 11.5–15.5)
WBC: 8.4 10*3/uL (ref 4.0–10.5)
WBC: 8.5 10*3/uL (ref 4.0–10.5)
WBC: 7.8 10*3/uL (ref 4.0–10.5)
nRBC: 0 % (ref 0.0–0.2)
nRBC: 0 % (ref 0.0–0.2)
nRBC: 0 % (ref 0.0–0.2)

## 2019-10-19 LAB — BASIC METABOLIC PANEL
Anion gap: 10 (ref 5–15)
BUN: 10 mg/dL (ref 6–20)
CO2: 25 mmol/L (ref 22–32)
Calcium: 8.2 mg/dL — ABNORMAL LOW (ref 8.9–10.3)
Chloride: 104 mmol/L (ref 98–111)
Creatinine, Ser: 0.93 mg/dL (ref 0.44–1.00)
GFR calc Af Amer: >60 mL/min (ref >60)
GFR calc non Af Amer: >60 mL/min (ref >60)
Glucose, Bld: 142 mg/dL — ABNORMAL HIGH (ref 70–99)
Potassium: 3.4 mmol/L — ABNORMAL LOW (ref 3.5–5.1)
Sodium: 139 mmol/L (ref 135–145)

## 2019-10-19 LAB — HCG, QUANTITATIVE, PREGNANCY: hCG, Beta Chain, Quant, S: <1 m[IU]/mL (ref <5)

## 2019-10-19 LAB — BRAIN NATRIURETIC PEPTIDE: B Natriuretic Peptide: 23 pg/mL (ref 0.0–100.0)

## 2019-10-19 LAB — APTT: aPTT: 31 seconds (ref 24–36)

## 2019-10-19 LAB — PROTIME-INR
INR: 1.1 (ref 0.8–1.2)
Prothrombin Time: 13.8 seconds (ref 11.4–15.2)

## 2019-10-19 LAB — HEPARIN LEVEL (UNFRACTIONATED)
Heparin Unfractionated: 0.26 IU/mL — ABNORMAL LOW (ref 0.30–0.70)
Heparin Unfractionated: 0.29 IU/mL — ABNORMAL LOW (ref 0.30–0.70)
Heparin Unfractionated: 0.32 IU/mL (ref 0.30–0.70)

## 2019-10-19 LAB — SARS CORONAVIRUS 2 (TAT 6-24 HRS): SARS Coronavirus 2: NEGATIVE

## 2019-10-19 LAB — TROPONIN I (HIGH SENSITIVITY): Troponin I (High Sensitivity): 12 ng/L (ref <18)

## 2019-10-19 LAB — T4, FREE: Free T4: 0.56 ng/dL — ABNORMAL LOW (ref 0.61–1.12)

## 2019-10-19 LAB — TSH: TSH: 14.222 u[IU]/mL — ABNORMAL HIGH (ref 0.350–4.500)

## 2019-10-19 MED ORDER — HEPARIN BOLUS VIA INFUSION
6000.0000 [IU] | Freq: Once | INTRAVENOUS | Status: AC
  Administered 2019-10-19: 6000 [IU] via INTRAVENOUS
  Filled 2019-10-19: qty 6000

## 2019-10-19 MED ORDER — HYDROMORPHONE HCL 1 MG/ML IJ SOLN
0.5000 mg | INTRAMUSCULAR | Status: DC | PRN
  Administered 2019-10-19 – 2019-10-20 (×3): 0.5 mg via INTRAVENOUS
  Filled 2019-10-19 (×3): qty 1

## 2019-10-19 MED ORDER — HEPARIN BOLUS VIA INFUSION
1600.0000 [IU] | Freq: Once | INTRAVENOUS | Status: AC
  Administered 2019-10-19: 1600 [IU] via INTRAVENOUS
  Filled 2019-10-19: qty 1600

## 2019-10-19 MED ORDER — INFLUENZA VAC SPLIT QUAD 0.5 ML IM SUSY
0.5000 mL | PREFILLED_SYRINGE | INTRAMUSCULAR | Status: AC
  Administered 2019-10-20: 0.5 mL via INTRAMUSCULAR
  Filled 2019-10-19: qty 0.5

## 2019-10-19 MED ORDER — MORPHINE SULFATE (PF) 2 MG/ML IV SOLN
2.0000 mg | INTRAVENOUS | Status: DC | PRN
  Administered 2019-10-19: 2 mg via INTRAVENOUS
  Filled 2019-10-19 (×2): qty 1

## 2019-10-19 MED ORDER — LEVOTHYROXINE SODIUM 50 MCG PO TABS
250.0000 ug | ORAL_TABLET | Freq: Every day | ORAL | Status: DC
  Administered 2019-10-19 – 2019-10-21 (×3): 250 ug via ORAL
  Filled 2019-10-19 (×4): qty 1

## 2019-10-19 MED ORDER — PNEUMOCOCCAL VAC POLYVALENT 25 MCG/0.5ML IJ INJ
0.5000 mL | INJECTION | INTRAMUSCULAR | Status: AC
  Administered 2019-10-20: 0.5 mL via INTRAMUSCULAR
  Filled 2019-10-19: qty 0.5

## 2019-10-19 MED ORDER — SODIUM CHLORIDE 0.9 % IV BOLUS
1000.0000 mL | Freq: Once | INTRAVENOUS | Status: AC
  Administered 2019-10-19: 1000 mL via INTRAVENOUS

## 2019-10-19 MED ORDER — HEPARIN (PORCINE) 25000 UT/250ML-% IV SOLN
2500.0000 [IU]/h | INTRAVENOUS | Status: DC
  Administered 2019-10-19: 2250 [IU]/h via INTRAVENOUS
  Administered 2019-10-19: 2050 [IU]/h via INTRAVENOUS
  Administered 2019-10-19: 1850 [IU]/h via INTRAVENOUS
  Filled 2019-10-19 (×3): qty 250

## 2019-10-19 MED ORDER — OXYCODONE HCL 5 MG PO TABS
5.0000 mg | ORAL_TABLET | ORAL | Status: DC | PRN
  Administered 2019-10-19 – 2019-10-20 (×3): 5 mg via ORAL
  Filled 2019-10-19 (×3): qty 1

## 2019-10-19 NOTE — ED Provider Notes (Addendum)
CT concerning for several submassive PEs with right heart strain.  Patient still with significant pain after 2 rounds of morphine.  Remains tachycardic with good blood pressure.  Will give IV fluids for increased preload.  Patient will be started on heparin and be admitted to the hospitalist service.    CRITICAL CARE Performed by: Rudene Re  ?  Total critical care time: 30 min  Critical care time was exclusive of separately billable procedures and treating other patients.  Critical care was necessary to treat or prevent imminent or life-threatening deterioration.  Critical care was time spent personally by me on the following activities: development of treatment plan with patient and/or surrogate as well as nursing, discussions with consultants, evaluation of patient's response to treatment, examination of patient, obtaining history from patient or surrogate, ordering and performing treatments and interventions, ordering and review of laboratory studies, ordering and review of radiographic studies, pulse oximetry and re-evaluation of patient's condition.    I have personally reviewed the images performed during this visit and I agree with the Radiologist's read.   Interpretation by Radiologist:  Ct Angio Chest Pe W And/or Wo Contrast  Result Date: 10/19/2019 CLINICAL DATA:  Left leg pain EXAM: CT ANGIOGRAPHY CHEST WITH CONTRAST TECHNIQUE: Multidetector CT imaging of the chest was performed using the standard protocol during bolus administration of intravenous contrast. Multiplanar CT image reconstructions and MIPs were obtained to evaluate the vascular anatomy. CONTRAST:  157mL OMNIPAQUE IOHEXOL 350 MG/ML SOLN COMPARISON:  09/13/2012 FINDINGS: Cardiovascular: There are bilateral pulmonary emboli noted in all lobes of both lungs. The largest are lobar and segmental emboli in the lower lobes. The RV: LV ratio is elevated at 1.25 suggesting right heart strain. Aorta is normal caliber.  Mediastinum/Nodes: No mediastinal, hilar, or axillary adenopathy. Lungs/Pleura: Airspace opacities in the lingula and left lower lobe could reflect early pulmonary infarcts. Cannot completely exclude infection. Recommend clinical correlation. No effusions. Upper Abdomen: Imaging into the upper abdomen shows no acute findings. Musculoskeletal: Chest wall soft tissues are unremarkable. No acute bony abnormality. Review of the MIP images confirms the above findings. IMPRESSION: Extensive bilateral pulmonary emboli. CT evidence of right heart strain (RV/LV Ratio = 1.25) consistent with at least submassive (intermediate risk) PE. The presence of right heart strain has been associated with an increased risk of morbidity and mortality. Please activate Code PE by paging [phone]. Critical Value/emergent results were called by telephone at the time of interpretation on 10/19/2019 at 12:13 am to the ER physician, who verbally acknowledged these results. Electronically Signed   By: Rolm Baptise M.D.   On: 10/19/2019 00:15      Rudene Re, MD 10/19/19 [place], [place], MD 10/19/19 [phone]

## 2019-10-19 NOTE — Progress Notes (Signed)
Brief updated progress note.  Please see same day H&P for full details This is a nonbillable note.  Patient is a 33 year old female with history significant for obesity with a BMI of 67.82 who presents for evaluation of left leg tenderness and swelling and inability to ambulate and was found to have a left lower extremity popliteal DVT as well as extensive bilateral pulmonary emboli.  She was started on heparin infusion.  On my arrival evaluation she remained hemodynamically stable.  She reports some of the pain in her leg has decreased however she has not attempted to ambulate.  She remains on a heparin infusion.  We will plan to maintain heparin infusion overnight and transition to a novel oral anticoagulant in the morning, likely apixaban.  Pending 2D echo.  Ralene Muskrat MD

## 2019-10-19 NOTE — Consult Note (Signed)
[facility] for Heparin Infusion  Indication: pulmonary embolus  Allergies  Allergen Reactions  . Penicillins Itching and Rash    Did it involve swelling of the face/tongue/throat, SOB, or low BP? No Did it involve sudden or severe rash/hives, skin peeling, or any reaction on the inside of your mouth or nose? No Did you need to seek medical attention at a hospital or doctor's office? No When did it last happen?10+ years ago If all above answers are "NO", may proceed with cephalosporin use.     Patient Measurements: Height: 5\' 7"  (170.2 cm) Weight: (!) 433 lb (196.4 kg) IBW/kg (Calculated) : 61.6 Heparin Dosing Weight: 111 kg  Vital Signs: Temp: 98.1 F (36.7 C) (12/04 0733) Temp Source: Oral (12/04 0733) BP: 149/85 (12/04 0733) Pulse Rate: 97 (12/04 0733)  Labs: Recent Labs    10/18/19 2153 10/19/19 0109 10/19/19 0518 10/19/19 0600 10/19/19 1521  HGB 11.5* 12.0 10.8*  --   --   HCT 36.5 36.9 33.6*  --   --   PLT 233 227 236  --   --   APTT  --  31  --   --   --   LABPROT  --  13.8  --   --   --   INR  --  1.1  --   --   --   HEPARINUNFRC  --   --   --  0.26* 0.29*  CREATININE 0.82 0.93  --   --   --   TROPONINIHS 12 12  --   --   --     Estimated Creatinine Clearance: 159.8 mL/min (by C-G formula based on SCr of 0.93 mg/dL).   Medical History: Past Medical History:  Diagnosis Date  . Anginal pain ([place])   . Dyspnea   . Essential hypertension   . Family history of adverse reaction to anesthesia    Mother slow to wake up after surgery  . Graves disease   . Lower extremity edema   . Morbid obesity ([place])   . Smoker     Assessment: Pharmacy consulted for heparin infusion dosing and monitoring for 33 yo female for submassive PEs with right heart strain.   12/4 heparin infusion started @ 1850 units/hr  12/4 0600 HL: 0.26 Level subtherapeutic.  Will order 1600 unit bolus and increase infusion rate to 2050 units/hr.  Recheck anti-Xa level in 6 hours  Goal of Therapy:  Heparin level 0.3-0.7 units/ml Monitor platelets by anticoagulation protocol: Yes   Plan:  12/4 1521 HL: 0.29 Level subtherapeutic. Will order bolus 1600 units and increase drip to 2250 units/hr. Recheck HL in 6 hrs Continue to monitor H&H and platelets  Noralee Space, PharmD, BCPS Clinical Pharmacist 10/19/2019 4:21 PM

## 2019-10-19 NOTE — H&P (Addendum)
History and Physical    Kimberly Nolan XAJ:[id number] DOB: November 07, 1986 DOA: 10/18/2019  PCP: Trey Sailors, PA-C  Patient coming from: Home, husband is at bedside  I have personally briefly reviewed patient's old medical records in [facility]  Chief Complaint: Left LE DVT and shortness of breath  HPI: Kimberly Nolan is a 33 y.o. female with medical history significant of hypertension, history of Graves' disease s/p thyroidectomy, and morbid obesity who presents with concerns of left lower extremity DVT and increasing shortness of breath.  For the past 3 to 4 days she has noticed increased cramping pain of her left lower extremity and presented to the ED yesterday.  However, she left prior to obtaining the results of her left lower extremity venous ultrasound because she felt the ED was too crowded.  Today, she was notified of the results of a DVT within the left popliteal vein prompting her to return to the ED today for evaluation.   Given her symptoms of shortness of breath and she was mildly tachycardic on exam, a CTA of the chest was obtained which showed bilateral pulmonary emboli in all lobes of both lungs and findings suggestive of RV strain. Patient endorsed tobacco use of half pack per day.  She denies any recent travel but notes that she has been more sedentary because of arthritic pain.  Denies recent surgeries.  No personal history of clots.  She thinks that her father might have had some kind of blood disorder but is unsure.  CBC showed no leukocytosis and had mild anemia of 11.5.  CMP showed glucose of 128, creatinine of 0.82.  Troponin of 12.  Heparin infusion was started.  She was also given a total of 8 mg of morphine for her left calf pain but feels that the pain is still the same and has not resolved.  Endorse tobacco use of half a pack per day.  Denies tobacco or illicit drug use   Review of Systems:  Constitutional: No Weight Change, No Fever ENT/Mouth: No  sore throat, No Rhinorrhea Eyes: No Eye Pain, No Vision Changes Cardiovascular: No Chest Pain, + SOB Respiratory: No Cough, No Sputum, No Wheezing, Gastrointestinal: No Nausea, No Vomiting, No Diarrhea, Genitourinary: no Urinary Incontinence, No Urgency, No Flank Pain Musculoskeletal:+ Arthralgias, No Myalgias Skin: No Skin Lesions, No Pruritus, Neuro: no Weakness, No Numbness,   Psych: No Anxiety/Panic, No Depression, no decrease appetite Heme/Lymph: No Bruising, No Bleeding  Past Medical History:  Diagnosis Date  . Anginal pain (HCC)   . Dyspnea   . Essential hypertension   . Family history of adverse reaction to anesthesia    Mother slow to wake up after surgery  . Graves disease   . Lower extremity edema   . Morbid obesity (HCC)   . Smoker     Past Surgical History:  Procedure Laterality Date  . CESAREAN SECTION    . PARATHYROIDECTOMY  06/27/2019   Procedure: PARATHYROIDECTOMY AUTOTRANSPLANT;  Surgeon: Duanne Guess, MD;  Location: [facility] ORS;  Service: General;;  . THYROIDECTOMY N/A 06/27/2019   Procedure: TOTAL THYROIDECTOMY ;  Surgeon: Duanne Guess, MD;  Location: [facility] ORS;  Service: General;  Laterality: N/A;     reports that she has been smoking cigarettes. She has a 1.50 pack-year smoking history. She has never used smokeless tobacco. She reports previous alcohol use. She reports that she does not use drugs.  Allergies  Allergen Reactions  . Penicillins Itching and Rash    Did  it involve swelling of the face/tongue/throat, SOB, or low BP? No Did it involve sudden or severe rash/hives, skin peeling, or any reaction on the inside of your mouth or nose? No Did you need to seek medical attention at a hospital or doctor's office? No When did it last happen?10+ years ago If all above answers are "NO", may proceed with cephalosporin use.     Family History  Problem Relation Age of Onset  . Thyroid disease Mother   . Hypertension Father   . Stroke Father    . Diabetes Brother   . Hypertension Brother      Prior to Admission medications   Medication Sig Start Date End Date Taking? Authorizing Provider  baclofen (LIORESAL) 10 MG tablet Take 10 mg by mouth every 8 (eight) hours as needed. 10/03/19   [provider]  levothyroxine (SYNTHROID) 125 MCG tablet Take 2 tablets (250 mcg total) by mouth daily before breakfast. 07/12/19 10/10/19  Duanne Guess, MD  meloxicam (MOBIC) 7.5 MG tablet Take 1 tablet (7.5 mg total) by mouth daily. 09/05/19   Trey Sailors, PA-C  oxyCODONE-acetaminophen (PERCOCET) 5-325 MG tablet Take 1 tablet by mouth every 4 (four) hours as needed for severe pain. 10/18/19   Minna Antis, MD  propranolol (INDERAL) 20 MG tablet TAKE 1 TABLET BY MOUTH TWICE A DAY Patient not taking: No sig reported 04/11/19   Trey Sailors, PA-C  Rivaroxaban 15 & 20 MG TBPK Follow package directions: Take one  tablet by mouth twice a day. On day 22, switch to one  tablet once a day. Take with food. 10/18/19   Minna Antis, MD  topiramate (TOPAMAX) 25 MG tablet Take 25 mg before bed for one week. Then take 50 mg before bed for one week. Then 75 mg before bed for one week. Then 1000 mg before bed onward. 10/08/19   Trey Sailors, PA-C    Physical Exam: Vitals:   10/18/19 2150 10/18/19 2204 10/18/19 2230 10/18/19 2245  BP:  113/78 129/86   Pulse: (!) 105 100  (!) 105  Resp: (!) 22     Temp:      TempSrc:      SpO2: 98% 99%  98%  Weight:      Height:        Constitutional: Well appearing comfortable morbidly obese female sitting upright in bed doing work on laptop. Vitals:   10/18/19 2150 10/18/19 2204 10/18/19 2230 10/18/19 2245  BP:  113/78 129/86   Pulse: (!) 105 100  (!) 105  Resp: (!) 22     Temp:      TempSrc:      SpO2: 98% 99%  98%  Weight:      Height:       Eyes:  lids and conjunctivae normal, wears glasses ENMT: Mucous membranes are moist. Normal dentition.  Neck: normal, supple,  no masses Respiratory: clear to auscultation bilaterally, no wheezing, no crackles. Normal respiratory effort on room air  cardiovascular: Sinus tachycardia up to 102 on telemetry monitor, no murmurs / rubs / gallops.  Nonpitting edema bilateral lower extremity.  Difficult to assess for dorsalis pedis pulse secondary to body habitus. Abdomen: no tenderness, Bowel sounds positive.  Musculoskeletal: no clubbing / cyanosis. No joint deformity upper and lower extremities.  Skin: no rashes, lesions, ulcers. No induration Neurologic: CN 2-12 grossly intact.  2 out of 5 strength of bilateral lower extremity and had difficulties lifting leg due to arthritic pain.  Good grip  strength.   Psychiatric: Normal judgment and insight. Alert and oriented x 3. Normal mood.     Labs on Admission: I have personally reviewed following labs and imaging studies  CBC: Recent Labs  Lab 10/18/19 2153  WBC 8.4  HGB 11.5*  HCT 36.5  MCV 85.3  PLT 426   Basic Metabolic Panel: Recent Labs  Lab 10/18/19 2153  NA 136  K 3.5  CL 103  CO2 24  GLUCOSE 129*  BUN 11  CREATININE 0.82  CALCIUM 8.3*   GFR: Estimated Creatinine Clearance: 177.6 mL/min (by C-G formula based on SCr of 0.82 mg/dL). Liver Function Tests: Recent Labs  Lab 10/18/19 2153  AST 13*  ALT 13  ALKPHOS 85  BILITOT 0.7  PROT 8.1  ALBUMIN 3.3*   No results for input(s): LIPASE, AMYLASE in the last 168 hours. No results for input(s): AMMONIA in the last 168 hours. Coagulation Profile: No results for input(s): INR, PROTIME in the last 168 hours. Cardiac Enzymes: No results for input(s): CKTOTAL, CKMB, CKMBINDEX, TROPONINI in the last 168 hours. BNP (last 3 results) No results for input(s): PROBNP in the last 8760 hours. HbA1C: No results for input(s): HGBA1C in the last 72 hours. CBG: No results for input(s): GLUCAP in the last 168 hours. Lipid Profile: No results for input(s): CHOL, HDL, LDLCALC, TRIG, CHOLHDL, LDLDIRECT in the  last 72 hours. Thyroid Function Tests: No results for input(s): TSH, T4TOTAL, FREET4, T3FREE, THYROIDAB in the last 72 hours. Anemia Panel: No results for input(s): VITAMINB12, FOLATE, FERRITIN, TIBC, IRON, RETICCTPCT in the last 72 hours. Urine analysis:    Component Value Date/Time   COLORURINE YELLOW (A) 02/16/2017 2009   APPEARANCEUR HAZY (A) 02/16/2017 2009   APPEARANCEUR Clear 07/16/2013 1339   LABSPEC 1.020 02/16/2017 2009   LABSPEC 1.014 07/16/2013 1339   PHURINE 8.0 02/16/2017 2009   GLUCOSEU NEGATIVE 02/16/2017 2009   GLUCOSEU Negative 07/16/2013 1339   HGBUR NEGATIVE 02/16/2017 2009   BILIRUBINUR NEGATIVE 02/16/2017 2009   BILIRUBINUR Negative 07/16/2013 1339   [place] NEGATIVE 02/16/2017 2009   PROTEINUR 30 (A) 02/16/2017 2009   NITRITE NEGATIVE 02/16/2017 2009   LEUKOCYTESUR NEGATIVE 02/16/2017 2009   LEUKOCYTESUR Negative 07/16/2013 1339    Radiological Exams on Admission: Ct Angio Chest Pe W And/or Wo Contrast  Result Date: 10/19/2019 CLINICAL DATA:  Left leg pain EXAM: CT ANGIOGRAPHY CHEST WITH CONTRAST TECHNIQUE: Multidetector CT imaging of the chest was performed using the standard protocol during bolus administration of intravenous contrast. Multiplanar CT image reconstructions and MIPs were obtained to evaluate the vascular anatomy. CONTRAST:  182mL OMNIPAQUE IOHEXOL 350 MG/ML SOLN COMPARISON:  09/13/2012 FINDINGS: Cardiovascular: There are bilateral pulmonary emboli noted in all lobes of both lungs. The largest are lobar and segmental emboli in the lower lobes. The RV: LV ratio is elevated at 1.25 suggesting right heart strain. Aorta is normal caliber. Mediastinum/Nodes: No mediastinal, hilar, or axillary adenopathy. Lungs/Pleura: Airspace opacities in the lingula and left lower lobe could reflect early pulmonary infarcts. Cannot completely exclude infection. Recommend clinical correlation. No effusions. Upper Abdomen: Imaging into the upper abdomen shows no acute  findings. Musculoskeletal: Chest wall soft tissues are unremarkable. No acute bony abnormality. Review of the MIP images confirms the above findings. IMPRESSION: Extensive bilateral pulmonary emboli. CT evidence of right heart strain (RV/LV Ratio = 1.25) consistent with at least submassive (intermediate risk) PE. The presence of right heart strain has been associated with an increased risk of morbidity and mortality. Please activate Code  PE by paging [phone]. Critical Value/emergent results were called by telephone at the time of interpretation on 10/19/2019 at 12:13 am to the ER physician, who verbally acknowledged these results. Electronically Signed   By: Charlett NoseKevin  Dover M.D.   On: 10/19/2019 00:15   [place] Venous Img Lower Unilateral Left  Result Date: 10/17/2019 CLINICAL DATA:  Left leg pain EXAM: LEFT LOWER EXTREMITY VENOUS DOPPLER ULTRASOUND TECHNIQUE: Gray-scale sonography with graded compression, as well as color Doppler and duplex ultrasound were performed to evaluate the lower extremity deep venous systems from the level of the common femoral vein and including the common femoral, femoral, profunda femoral, popliteal and calf veins including the posterior tibial, peroneal and gastrocnemius veins when visible. The superficial great saphenous vein was also interrogated. Spectral Doppler was utilized to evaluate flow at rest and with distal augmentation maneuvers in the common femoral, femoral and popliteal veins. COMPARISON:  None. FINDINGS: Contralateral Common Femoral Vein: Respiratory phasicity is normal and symmetric with the symptomatic side. No evidence of thrombus. Normal compressibility. Common Femoral Vein: No evidence of thrombus. Normal compressibility, respiratory phasicity and response to augmentation. Saphenofemoral Junction: No evidence of thrombus. Normal compressibility and flow on color Doppler imaging. Profunda Femoral Vein: No evidence of thrombus. Normal compressibility and flow on  color Doppler imaging. Femoral Vein: No evidence of thrombus. Normal compressibility, respiratory phasicity and response to augmentation. Popliteal Vein: Occlusive thrombus noted in the left popliteal vein. No flow visualized. Calf Veins: Not well visualized. Superficial Great Saphenous Vein: No evidence of thrombus. Normal compressibility. Venous Reflux:  None. Other Findings:  None. IMPRESSION: Acute deep venous thrombosis within the left popliteal vein. Electronically Signed   By: Charlett NoseKevin  Dover M.D.   On: 10/17/2019 23:53      Assessment/Plan  Submassive PE with right heart strain -Extensive bilateral pulmonary emboli in all lobes of the lung with right heart strain.  Currently hemodynamically stable and normotensive. -Continue heparin drip -Obtain echocardiogram  DVT of the left popliteal vein -Continue heparin drip as above -As needed morphine for moderate pain and Dilaudid for severe pain  Hypothyroidism in the setting of thyroidectomy -Continue levothyroxine - will check TSH and free T4. Patient has outpatient documentation of non-compliance at times with medication.  Morbid obesity -BMI greater than 65. Likely contributing factor to hypercoagulability.  Chronic pain - noted PRN baclofen and Topamax that was recently started by PCP on medication list.  However, reconciliation was not completed at the time mission.  Will need to verify with patient.  DVT prophylaxis: Heparin  Code Status: DNR Family Communication: Plan discussed with patient and husband at bedside  disposition Plan: Home with observation Consults called:  Admission status: Observation   Tanielle Emigh T Valera Vallas DO [facility]   If 7PM-7AM, please contact night-coverage [email or web address] Password [email or web address]  10/19/2019, 1:07 AM

## 2019-10-19 NOTE — Plan of Care (Signed)

## 2019-10-19 NOTE — Consult Note (Signed)
[place] for Heparin Infusion  Indication: pulmonary embolus  Allergies  Allergen Reactions  . Penicillins Itching and Rash    Did it involve swelling of the face/tongue/throat, SOB, or low BP? No Did it involve sudden or severe rash/hives, skin peeling, or any reaction on the inside of your mouth or nose? No Did you need to seek medical attention at a hospital or doctor's office? No When did it last happen?10+ years ago If all above answers are "NO", may proceed with cephalosporin use.     Patient Measurements: Height: 5\' 7"  (170.2 cm) Weight: (!) 433 lb (196.4 kg) IBW/kg (Calculated) : 61.6 Heparin Dosing Weight: 111 kg  Vital Signs: Temp: 98.9 F (37.2 C) (12/04 1924) Temp Source: Oral (12/04 1924) BP: 136/74 (12/04 1924) Pulse Rate: 102 (12/04 1924)  Labs: Recent Labs    10/18/19 2153 10/19/19 0109 10/19/19 0518 10/19/19 0600 10/19/19 1521 10/19/19 2237  HGB 11.5* 12.0 10.8*  --   --   --   HCT 36.5 36.9 33.6*  --   --   --   PLT 233 227 236  --   --   --   APTT  --  31  --   --   --   --   LABPROT  --  13.8  --   --   --   --   INR  --  1.1  --   --   --   --   HEPARINUNFRC  --   --   --  0.26* 0.29* 0.32  CREATININE 0.82 0.93  --   --   --   --   TROPONINIHS 12 12  --   --   --   --     Estimated Creatinine Clearance: 159.8 mL/min (by C-G formula based on SCr of 0.93 mg/dL).   Medical History: Past Medical History:  Diagnosis Date  . Anginal pain ([place])   . Dyspnea   . Essential hypertension   . Family history of adverse reaction to anesthesia    Mother slow to wake up after surgery  . Graves disease   . Lower extremity edema   . Morbid obesity ([place])   . Smoker     Assessment: Pharmacy consulted for heparin infusion dosing and monitoring for 33 yo female for submassive PEs with right heart strain.   12/4 heparin infusion started @ 1850 units/hr  12/4 0600 HL: 0.26 Level subtherapeutic.  Will order  1600 unit bolus and increase infusion rate to 2050 units/hr. Recheck anti-Xa level in 6 hours 12/4 1521 HL: 0.29 Level subtherapeutic. Infusion rate increased to 2250.    Goal of Therapy:  Heparin level 0.3-0.7 units/ml Monitor platelets by anticoagulation protocol: Yes   Plan:  12/4 2237 HL: 0.32 Level therapeutic x1. Due to level being just barely therapeutic, will increase to 2300 units/hr. Will recheck confirmatory level in 6 hours.   Continue to monitor H&H and platelets. CBCs daily with AM labs per protocol.   Pernell Dupre, PharmD, BCPS Clinical Pharmacist 10/19/2019 11:05 PM

## 2019-10-19 NOTE — ED Notes (Signed)
Pt assisted to restroom.  

## 2019-10-19 NOTE — Progress Notes (Signed)
*  PRELIMINARY RESULTS* Echocardiogram 2D Echocardiogram has been performed.  Sherrie Sport 10/19/2019, 9:51 AM

## 2019-10-19 NOTE — Consult Note (Signed)
ANTICOAGULATION CONSULT NOTE - Initial Consult  Pharmacy Consult for Heparin Infusion  Indication: pulmonary embolus  Allergies  Allergen Reactions  . Penicillins Itching and Rash    Did it involve swelling of the face/tongue/throat, SOB, or low BP? No Did it involve sudden or severe rash/hives, skin peeling, or any reaction on the inside of your mouth or nose? No Did you need to seek medical attention at a hospital or doctor's office? No When did it last happen?10+ years ago If all above answers are "NO", may proceed with cephalosporin use.     Patient Measurements: Height: 5\' 7"  (170.2 cm) Weight: (!) 433 lb (196.4 kg) IBW/kg (Calculated) : 61.6 Heparin Dosing Weight: 111 kg  Vital Signs: Temp: 97.5 F (36.4 C) (12/04 0420) Temp Source: Oral (12/04 0420) BP: 133/90 (12/04 0420) Pulse Rate: 108 (12/04 0420)  Labs: Recent Labs    10/18/19 2153 10/19/19 0109 10/19/19 0518 10/19/19 0600  HGB 11.5* 12.0 10.8*  --   HCT 36.5 36.9 33.6*  --   PLT 233 227 236  --   APTT  --  31  --   --   LABPROT  --  13.8  --   --   INR  --  1.1  --   --   HEPARINUNFRC  --   --   --  0.26*  CREATININE 0.82 0.93  --   --   TROPONINIHS 12 12  --   --     Estimated Creatinine Clearance: 159.8 mL/min (by C-G formula based on SCr of 0.93 mg/dL).   Medical History: Past Medical History:  Diagnosis Date  . Anginal pain ([place])   . Dyspnea   . Essential hypertension   . Family history of adverse reaction to anesthesia    Mother slow to wake up after surgery  . Graves disease   . Lower extremity edema   . Morbid obesity ([place])   . Smoker     Assessment: Pharmacy consulted for heparin infusion dosing and monitoring for 33 yo female for submassive PEs with right heart strain.   12/4 heparin infusion started @ 1850 units/hr   Goal of Therapy:  Heparin level 0.3-0.7 units/ml Monitor platelets by anticoagulation protocol: Yes   Plan:  12/4 0600 HL: 0.26 Level subtherapeutic.   Will order 1600 unit bolus and increase infusion rate to 2050 units/hr Recheck anti-Xa level in 6 hours and daily while on heparin Continue to monitor H&H and platelets  Pernell Dupre, PharmD, BCPS Clinical Pharmacist 10/19/2019 7:12 AM

## 2019-10-19 NOTE — Consult Note (Signed)
ANTICOAGULATION CONSULT NOTE - Initial Consult  Pharmacy Consult for Heparin Infusion  Indication: pulmonary embolus  Allergies  Allergen Reactions  . Penicillins Itching and Rash    Did it involve swelling of the face/tongue/throat, SOB, or low BP? No Did it involve sudden or severe rash/hives, skin peeling, or any reaction on the inside of your mouth or nose? No Did you need to seek medical attention at a hospital or doctor's office? No When did it last happen?10+ years ago If all above answers are "NO", may proceed with cephalosporin use.     Patient Measurements: Height: 5\' 7"  (170.2 cm) Weight: (!) 420 lb (190.5 kg) IBW/kg (Calculated) : 61.6 Heparin Dosing Weight: 111 kg  Vital Signs: Temp: 100 F (37.8 C) (12/03 1853) Temp Source: Oral (12/03 1853) BP: 129/86 (12/03 2230) Pulse Rate: 105 (12/03 2245)  Labs: Recent Labs    10/18/19 2153  HGB 11.5*  HCT 36.5  PLT 233  CREATININE 0.82  TROPONINIHS 12    Estimated Creatinine Clearance: 177.6 mL/min (by C-G formula based on SCr of 0.82 mg/dL).   Medical History: Past Medical History:  Diagnosis Date  . Anginal pain ([place])   . Dyspnea   . Essential hypertension   . Family history of adverse reaction to anesthesia    Mother slow to wake up after surgery  . Graves disease   . Lower extremity edema   . Morbid obesity ([place])   . Smoker     Assessment: Pharmacy consulted for heparin infusion dosing and monitoring for 33 yo female for submassive PEs with right heart strain.   Goal of Therapy:  Heparin level 0.3-0.7 units/ml Monitor platelets by anticoagulation protocol: Yes   Plan:  Baseline labs ordered  Give 6000 units bolus x 1 Start heparin infusion at 1850 units/hr Check anti-Xa level in 6 hours and daily while on heparin Continue to monitor H&H and platelets  Pernell Dupre, PharmD, BCPS Clinical Pharmacist 10/19/2019 12:43 AM

## 2019-10-20 DIAGNOSIS — I2699 Other pulmonary embolism without acute cor pulmonale: Principal | ICD-10-CM

## 2019-10-20 DIAGNOSIS — I82432 Acute embolism and thrombosis of left popliteal vein: Secondary | ICD-10-CM

## 2019-10-20 DIAGNOSIS — Z66 Do not resuscitate: Secondary | ICD-10-CM

## 2019-10-20 LAB — CBC
HCT: 26.4 % — ABNORMAL LOW (ref 36.0–46.0)
Hemoglobin: 8.6 g/dL — ABNORMAL LOW (ref 12.0–15.0)
MCH: 27 pg (ref 26.0–34.0)
MCHC: 32.6 g/dL (ref 30.0–36.0)
MCV: 82.8 fL (ref 80.0–100.0)
Platelets: 271 10*3/uL (ref 150–400)
RBC: 3.19 MIL/uL — ABNORMAL LOW (ref 3.87–5.11)
RDW: 14.1 % (ref 11.5–15.5)
WBC: 8.8 10*3/uL (ref 4.0–10.5)
nRBC: 0 % (ref 0.0–0.2)

## 2019-10-20 LAB — HEPARIN LEVEL (UNFRACTIONATED): Heparin Unfractionated: 0.27 IU/mL — ABNORMAL LOW (ref 0.30–0.70)

## 2019-10-20 MED ORDER — HYDROMORPHONE HCL 1 MG/ML IJ SOLN
1.0000 mg | Freq: Once | INTRAMUSCULAR | Status: AC
  Administered 2019-10-20: 1 mg via INTRAVENOUS
  Filled 2019-10-20: qty 1

## 2019-10-20 MED ORDER — KETOROLAC TROMETHAMINE 15 MG/ML IJ SOLN
15.0000 mg | Freq: Four times a day (QID) | INTRAMUSCULAR | Status: DC
  Administered 2019-10-20 – 2019-10-21 (×4): 15 mg via INTRAVENOUS
  Filled 2019-10-20 (×4): qty 1

## 2019-10-20 MED ORDER — HEPARIN (PORCINE) 25000 UT/250ML-% IV SOLN
INTRAVENOUS | Status: AC
  Filled 2019-10-20: qty 250

## 2019-10-20 MED ORDER — APIXABAN 5 MG PO TABS
10.0000 mg | ORAL_TABLET | Freq: Two times a day (BID) | ORAL | Status: DC
  Administered 2019-10-20 – 2019-10-21 (×3): 10 mg via ORAL
  Filled 2019-10-20 (×3): qty 2

## 2019-10-20 MED ORDER — GABAPENTIN 600 MG PO TABS
300.0000 mg | ORAL_TABLET | Freq: Three times a day (TID) | ORAL | Status: DC
  Administered 2019-10-20 – 2019-10-21 (×4): 300 mg via ORAL
  Filled 2019-10-20 (×4): qty 1

## 2019-10-20 MED ORDER — HEPARIN BOLUS VIA INFUSION
1600.0000 [IU] | Freq: Once | INTRAVENOUS | Status: AC
  Administered 2019-10-20: 1600 [IU] via INTRAVENOUS
  Filled 2019-10-20: qty 1600

## 2019-10-20 MED ORDER — APIXABAN 5 MG PO TABS: 5.0000 mg | ORAL_TABLET | Freq: Two times a day (BID) | ORAL | Status: DC

## 2019-10-20 NOTE — Plan of Care (Signed)

## 2019-10-20 NOTE — Progress Notes (Signed)
PT Cancellation Note  Patient Details Name: Kimberly Nolan MRN: [id number] DOB: 29-Aug-1986   Cancelled Treatment:    Reason Eval/Treat Not Completed: Medical issues which prohibited therapy(Consult received and chart reviewed. Patient noted with acute submassive PE with R heart strain; started on hep drip 12/4 at 0125. Per guidelines, patient to be on anti-coag x48 hours prior to initiation of exertional activity.  Will hold at this time and plan evaluation after  0125 on 12/6.)   Raedyn Klinck H. Owens Shark, PT, DPT, NCS 10/20/19, 9:59 AM [phone]

## 2019-10-20 NOTE — Progress Notes (Signed)
Patient is having 10/10 left leg pain which is unrelieved by oxycodone and dilaudid. Provider on call-[place] notified. Will continue to monitor.

## 2019-10-20 NOTE — Consult Note (Addendum)
[place] for Heparin Infusion  Indication: pulmonary embolus  Allergies  Allergen Reactions  . Penicillins Itching and Rash    Did it involve swelling of the face/tongue/throat, SOB, or low BP? No Did it involve sudden or severe rash/hives, skin peeling, or any reaction on the inside of your mouth or nose? No Did you need to seek medical attention at a hospital or doctor's office? No When did it last happen?10+ years ago If all above answers are "NO", may proceed with cephalosporin use.     Patient Measurements: Height: 5\' 7"  (170.2 cm) Weight: (!) 434 lb (196.9 kg) IBW/kg (Calculated) : 61.6 Heparin Dosing Weight: 111 kg  Vital Signs: Temp: 98.6 F (37 C) (12/05 0351) Temp Source: Oral (12/05 0351) BP: 93/67 (12/05 0351) Pulse Rate: 99 (12/05 0351)  Labs: Recent Labs    10/18/19 2153 10/19/19 0109 10/19/19 0518  10/19/19 1521 10/19/19 2237 10/20/19 0509  HGB 11.5* 12.0 10.8*  --   --   --  8.6*  HCT 36.5 36.9 33.6*  --   --   --  26.4*  PLT 233 227 236  --   --   --  271  APTT  --  31  --   --   --   --   --   LABPROT  --  13.8  --   --   --   --   --   INR  --  1.1  --   --   --   --   --   HEPARINUNFRC  --   --   --    < > 0.29* 0.32 0.27*  CREATININE 0.82 0.93  --   --   --   --   --   TROPONINIHS 12 12  --   --   --   --   --    < > = values in this interval not displayed.    Estimated Creatinine Clearance: 160.1 mL/min (by C-G formula based on SCr of 0.93 mg/dL).   Medical History: Past Medical History:  Diagnosis Date  . Anginal pain ([place])   . Dyspnea   . Essential hypertension   . Family history of adverse reaction to anesthesia    Mother slow to wake up after surgery  . Graves disease   . Lower extremity edema   . Morbid obesity ([place])   . Smoker     Assessment: Pharmacy consulted for heparin infusion dosing and monitoring for 33 yo female for submassive PEs with right heart strain.   12/4 heparin  infusion started @ 1850 units/hr  12/4 0600 HL: 0.26 Level subtherapeutic.  Will order 1600 unit bolus and increase infusion rate to 2050 units/hr. Recheck anti-Xa level in 6 hours 12/4 1521 HL: 0.29 Level subtherapeutic. Infusion rate increased to 2250.  12/4 2237 HL: 0.32 Level therapeutic x1.   Goal of Therapy:  Heparin level 0.3-0.7 units/ml Monitor platelets by anticoagulation protocol: Yes   Plan:  12/5 2237 HL: 0.27 Level is subtherapeutic.  Hgb: 10.8>> 8.6 - continue to monitor.  Will order 1600 unit bolus and increase infusion rate to 2500 units/hr. Will recheck confirmatory level in 6 hours.   Continue to monitor H&H and platelets. CBCs daily with AM labs per protocol.   Pernell Dupre, PharmD, BCPS Clinical Pharmacist 10/20/2019 5:52 AM

## 2019-10-20 NOTE — Consult Note (Signed)
[facility] for Apixaban  Indication: pulmonary embolus  Allergies  Allergen Reactions  . Penicillins Itching and Rash    Did it involve swelling of the face/tongue/throat, SOB, or low BP? No Did it involve sudden or severe rash/hives, skin peeling, or any reaction on the inside of your mouth or nose? No Did you need to seek medical attention at a hospital or doctor's office? No When did it last happen?10+ years ago If all above answers are "NO", may proceed with cephalosporin use.     Patient Measurements: Height: 5\' 7"  (170.2 cm) Weight: (!) 434 lb (196.9 kg) IBW/kg (Calculated) : 61.6 Heparin Dosing Weight: 111 kg  Vital Signs: Temp: 98.5 F (36.9 C) (12/05 0913) Temp Source: Oral (12/05 0913) BP: 107/68 (12/05 0913) Pulse Rate: 97 (12/05 0913)  Labs: Recent Labs    10/18/19 2153 10/19/19 0109 10/19/19 0518  10/19/19 1521 10/19/19 2237 10/20/19 0509  HGB 11.5* 12.0 10.8*  --   --   --  8.6*  HCT 36.5 36.9 33.6*  --   --   --  26.4*  PLT 233 227 236  --   --   --  271  APTT  --  31  --   --   --   --   --   LABPROT  --  13.8  --   --   --   --   --   INR  --  1.1  --   --   --   --   --   HEPARINUNFRC  --   --   --    < > 0.29* 0.32 0.27*  CREATININE 0.82 0.93  --   --   --   --   --   TROPONINIHS 12 12  --   --   --   --   --    < > = values in this interval not displayed.    Estimated Creatinine Clearance: 160.1 mL/min (by C-G formula based on SCr of 0.93 mg/dL).   Medical History: Past Medical History:  Diagnosis Date  . Anginal pain ([place])   . Dyspnea   . Essential hypertension   . Family history of adverse reaction to anesthesia    Mother slow to wake up after surgery  . Graves disease   . Lower extremity edema   . Morbid obesity ([place])   . Smoker     Assessment: Pharmacy consulted for heparin infusion dosing and monitoring for 33 yo female for submassive PEs with right heart strain. Patient now being  transitioned to Apixaban.  Goal of Therapy:  Heparin level 0.3-0.7 units/ml Monitor platelets by anticoagulation protocol: Yes   Plan:  Heparin has been discontinued. Will order Apixaban 10mg  PO BID times 7 days followed by 5mg  PO BID. Will continue to monitor per protocol.  Paulina Fusi, PharmD, BCPS 10/20/2019 9:57 AM

## 2019-10-20 NOTE — Progress Notes (Signed)
PROGRESS NOTE    Kimberly DownsChandrike K Nolan  ZOX:[id number] DOB: 27-Nov-1985 DOA: 10/18/2019 PCP: Trey SailorsPollak, Adriana M, PA-C  Brief Narrative:  Per HPI:  Kimberly DownsChandrike K Hornik is a 33 y.o. female with medical history significant of hypertension, history of Graves' disease s/p thyroidectomy, and morbid obesity who presents with concerns of left lower extremity DVT and increasing shortness of breath.  For the past 3 to 4 days she has noticed increased cramping pain of her left lower extremity and presented to the ED yesterday.  However, she left prior to obtaining the results of her left lower extremity venous ultrasound because she felt the ED was too crowded.  Today, she was notified of the results of a DVT within the left popliteal vein prompting her to return to the ED today for evaluation.   Given her symptoms of shortness of breath and she was mildly tachycardic on exam, a CTA of the chest was obtained which showed bilateral pulmonary emboli in all lobes of both lungs and findings suggestive of RV strain. Patient endorsed tobacco use of half pack per day.  She denies any recent travel but notes that she has been more sedentary because of arthritic pain.  Denies recent surgeries.  No personal history of clots.  She thinks that her father might have had some kind of blood disorder but is unsure.  CBC showed no leukocytosis and had mild anemia of 11.5.  CMP showed glucose of 128, creatinine of 0.82.  Troponin of 12.  Heparin infusion was started.  She was also given a total of 8 mg of morphine for her left calf pain but feels that the pain is still the same and has not resolved.  Endorse tobacco use of half a pack per day.  Denies tobacco or illicit drug use  12/4: Continued heparin infusion.  Patient endorsing pain in left leg.  Multimodal pain regimen  12/5: Continues to endorse pain and left leg.  Remains on heparin infusion.  Assessment & Plan:   Principal Problem:   Pulmonary embolism (HCC) Active  Problems:   Morbid obesity (HCC)   S/P total thyroidectomy   DVT (deep venous thrombosis) (HCC)   Do not resuscitate status   Pulmonary emboli (HCC)  Submassive PE with right heart strain -Extensive bilateral pulmonary emboli in all lobes of the lung with right heart strain.   - Currently hemodynamically stable and normotensive. - DC heparin drip - Start Eliquis treatment regimen (10 mg twice daily x7 days followed by 5 mg twice daily for 3 to 6 months)  DVT of the left popliteal vein -See above for management -Multimodal pain regimen  Hypothyroidism in the setting of thyroidectomy -Continue levothyroxine - will check TSH and free T4 (indicates undertreated hypothyroidism) - Patient has outpatient documentation of non-compliance at times with medication.  Morbid obesity -BMI greater than 65. Likely contributing factor to hypercoagulability.  Chronic pain - noted PRN baclofen and Topamax that was recently started by PCP on medication list. -Can resume post discharge, see inpatient med list for current pain regimen    DVT prophylaxis: Eliquis Code Status: Full Family Communication: None today  disposition Plan: Home, anticipate 24 hours, pending PT eval  Consultants:   None   Procedures:   None   Antimicrobials:  None   Subjective: Seen and examined Continues to endorse severe left leg pain, unresponsive to narcotic therapy No other complaints Vital signs stable  Objective: Vitals:   10/19/19 0733 10/19/19 1924 10/20/19 0351 10/20/19 0913  BP: Marland Kitchen(!)  149/85 136/74 93/67 107/68  Pulse: 97 (!) 102 99 97  Resp:  20 20 (!) 22  Temp: 98.1 F (36.7 C) 98.9 F (37.2 C) 98.6 F (37 C) 98.5 F (36.9 C)  TempSrc: Oral Oral Oral Oral  SpO2: 97% 99% 95% 90%  Weight:   (!) 196.9 kg   Height:        Intake/Output Summary (Last 24 hours) at 10/20/2019 1302 Last data filed at 10/20/2019 1145 Gross per 24 hour  Intake 929.37 ml  Output 0 ml  Net 929.37 ml    Filed Weights   10/18/19 1853 10/19/19 0420 10/20/19 0351  Weight: (!) 190.5 kg (!) 196.4 kg (!) 196.9 kg    Examination:  General exam: Appears calm and comfortable, obese Respiratory system: Clear to auscultation. Respiratory effort normal. Cardiovascular system: S1 & S2 heard, RRR. No JVD, murmurs, rubs, gallops or clicks. No pedal edema. Gastrointestinal system: Abdomen is nondistended, soft and nontender. No organomegaly or masses felt. Normal bowel sounds heard. Central nervous system: Alert and oriented. No focal neurological deficits. Extremities: Tender to palpation and decreased range of motion of left lower extremity Skin: No rashes, lesions or ulcers Psychiatry: Judgement and insight appear normal. Mood & affect appropriate.     Data Reviewed: I have personally reviewed following labs and imaging studies  CBC: Recent Labs  Lab 10/18/19 2153 10/19/19 0109 10/19/19 0518 10/20/19 0509  WBC 8.4 8.5 7.8 8.8  HGB 11.5* 12.0 10.8* 8.6*  HCT 36.5 36.9 33.6* 26.4*  MCV 85.3 83.1 84.2 82.8  PLT 233 227 236 271   Basic Metabolic Panel: Recent Labs  Lab 10/18/19 2153 10/19/19 0109  NA 136 139  K 3.5 3.4*  CL 103 104  CO2 24 25  GLUCOSE 129* 142*  BUN 11 10  CREATININE 0.82 0.93  CALCIUM 8.3* 8.2*   GFR: Estimated Creatinine Clearance: 160.1 mL/min (by C-G formula based on SCr of 0.93 mg/dL). Liver Function Tests: Recent Labs  Lab 10/18/19 2153  AST 13*  ALT 13  ALKPHOS 85  BILITOT 0.7  PROT 8.1  ALBUMIN 3.3*   No results for input(s): LIPASE, AMYLASE in the last 168 hours. No results for input(s): AMMONIA in the last 168 hours. Coagulation Profile: Recent Labs  Lab 10/19/19 0109  INR 1.1   Cardiac Enzymes: No results for input(s): CKTOTAL, CKMB, CKMBINDEX, TROPONINI in the last 168 hours. BNP (last 3 results) No results for input(s): PROBNP in the last 8760 hours. HbA1C: No results for input(s): HGBA1C in the last 72 hours. CBG: No results  for input(s): GLUCAP in the last 168 hours. Lipid Profile: No results for input(s): CHOL, HDL, LDLCALC, TRIG, CHOLHDL, LDLDIRECT in the last 72 hours. Thyroid Function Tests: Recent Labs    10/19/19 0109  TSH 14.222*  FREET4 0.56*   Anemia Panel: No results for input(s): VITAMINB12, FOLATE, FERRITIN, TIBC, IRON, RETICCTPCT in the last 72 hours. Sepsis Labs: No results for input(s): PROCALCITON, LATICACIDVEN in the last 168 hours.  Recent Results (from the past 240 hour(s))  SARS CORONAVIRUS 2 (TAT 6-24 HRS) Nasopharyngeal Nasopharyngeal Swab     Status: None   Collection Time: 10/19/19 12:44 AM   Specimen: Nasopharyngeal Swab  Result Value Ref Range Status   SARS Coronavirus 2 NEGATIVE NEGATIVE Final    Comment: (NOTE) SARS-CoV-2 target nucleic acids are NOT DETECTED. The SARS-CoV-2 RNA is generally detectable in upper and lower respiratory specimens during the acute phase of infection. Negative results do not preclude SARS-CoV-2 infection,  do not rule out co-infections with other pathogens, and should not be used as the sole basis for treatment or other patient management decisions. Negative results must be combined with clinical observations, patient history, and epidemiological information. The expected result is Negative. Fact Sheet for Patients: SugarRoll.be Fact Sheet for Healthcare Providers: https://www.woods-mathews.com/ This test is not yet approved or cleared by the [place] FDA and  has been authorized for detection and/or diagnosis of SARS-CoV-2 by FDA under an Emergency Use Authorization (EUA). This EUA will remain  in effect (meaning this test can be used) for the duration of the COVID-19 declaration under Section 56 4(b)(1) of the Act, 21 U.S.C. section 360bbb-3(b)(1), unless the authorization is terminated or revoked sooner. Performed at [facility], [place] [address], [place], [place] [address]           Radiology Studies: Ct Angio Chest Pe W And/or Wo Contrast  Result Date: 10/19/2019 CLINICAL DATA:  Left leg pain EXAM: CT ANGIOGRAPHY CHEST WITH CONTRAST TECHNIQUE: Multidetector CT imaging of the chest was performed using the standard protocol during bolus administration of intravenous contrast. Multiplanar CT image reconstructions and MIPs were obtained to evaluate the vascular anatomy. CONTRAST:  172mL OMNIPAQUE IOHEXOL 350 MG/ML SOLN COMPARISON:  09/13/2012 FINDINGS: Cardiovascular: There are bilateral pulmonary emboli noted in all lobes of both lungs. The largest are lobar and segmental emboli in the lower lobes. The RV: LV ratio is elevated at 1.25 suggesting right heart strain. Aorta is normal caliber. Mediastinum/Nodes: No mediastinal, hilar, or axillary adenopathy. Lungs/Pleura: Airspace opacities in the lingula and left lower lobe could reflect early pulmonary infarcts. Cannot completely exclude infection. Recommend clinical correlation. No effusions. Upper Abdomen: Imaging into the upper abdomen shows no acute findings. Musculoskeletal: Chest wall soft tissues are unremarkable. No acute bony abnormality. Review of the MIP images confirms the above findings. IMPRESSION: Extensive bilateral pulmonary emboli. CT evidence of right heart strain (RV/LV Ratio = 1.25) consistent with at least submassive (intermediate risk) PE. The presence of right heart strain has been associated with an increased risk of morbidity and mortality. Please activate Code PE by paging [phone]. Critical Value/emergent results were called by telephone at the time of interpretation on 10/19/2019 at 12:13 am to the ER physician, who verbally acknowledged these results. Electronically Signed   By: Rolm Baptise M.D.   On: 10/19/2019 00:15        Scheduled Meds: . apixaban  10 mg Oral BID  . [START ON 10/27/2019] apixaban  5 mg Oral BID  . gabapentin  300 mg Oral TID  . ketorolac  15 mg Intravenous Q6H  .  levothyroxine  250 mcg Oral QAC breakfast  . sodium chloride flush  10-40 mL Intracatheter Q12H   Continuous Infusions: . heparin       LOS: 1 day    Time spent: 35 minutes    Sidney Ace, MD [facility] Pager [phone]  If 7PM-7AM, please contact night-coverage [email or web address] Password [email or web address] 10/20/2019, 1:02 PM

## 2019-10-21 DIAGNOSIS — I2609 Other pulmonary embolism with acute cor pulmonale: Secondary | ICD-10-CM

## 2019-10-21 LAB — CBC
HCT: 31 % — ABNORMAL LOW (ref 36.0–46.0)
Hemoglobin: 10.2 g/dL — ABNORMAL LOW (ref 12.0–15.0)
MCH: 27.1 pg (ref 26.0–34.0)
MCHC: 32.9 g/dL (ref 30.0–36.0)
MCV: 82.2 fL (ref 80.0–100.0)
Platelets: 242 10*3/uL (ref 150–400)
RBC: 3.77 MIL/uL — ABNORMAL LOW (ref 3.87–5.11)
RDW: 13.6 % (ref 11.5–15.5)
WBC: 5.3 10*3/uL (ref 4.0–10.5)
nRBC: 0 % (ref 0.0–0.2)

## 2019-10-21 LAB — ECHOCARDIOGRAM COMPLETE
Height: 67 in
Weight: 6928 oz

## 2019-10-21 MED ORDER — GABAPENTIN 600 MG PO TABS: 300.0000 mg | ORAL_TABLET | Freq: Three times a day (TID) | ORAL | 0 refills | Status: DC

## 2019-10-21 MED ORDER — APIXABAN 5 MG PO TABS: ORAL_TABLET | ORAL | 0 refills | Status: DC

## 2019-10-21 NOTE — Progress Notes (Signed)
Spoke with RN Soumoungoun and made her aware that the patient has a midline and that she can d/c the patient's line.

## 2019-10-21 NOTE — Progress Notes (Signed)
Midline IV removed , no active bleeding noted, patient discharge home as per order, all discharge instruction reviewed with patient prior discharge.

## 2019-10-21 NOTE — Discharge Summary (Signed)
Physician Discharge Summary  Kimberly Nolan FMB:[id number] DOB: 1986-02-07 DOA: 10/18/2019  PCP: Trey Sailors, PA-C  Admit date: 10/18/2019 Discharge date: 10/21/2019  Admitted From: Home Disposition:  Home  Recommendations for Outpatient Follow-up:  1. Follow up with PCP in 1-2 weeks 2. Discuss treatment length and medication refills for Eliquis 3. Discuss possible referral to hematology  Home Health:NO Equipment/Devices: None  Discharge Condition: Stable CODE STATUS: DNR Diet recommendation: Regular  Brief/Interim Summary:Kimberly K Foustis a 33 y.o.femalewith medical history significant ofhypertension, history of Graves' disease s/p thyroidectomy, and morbid obesity who presents with concerns of left lower extremity DVT and increasing shortness of breath. For the past 3 to 4 days she has noticed increased cramping pain of her left lower extremity and presented to the ED yesterday. However, she left prior to obtaining the results of her left lower extremity venous ultrasound because she felt the ED was too crowded. Today, she was notified of the results of a DVT within the left popliteal vein prompting her to return to the ED today for evaluation.  Given her symptoms of shortness of breath and she was mildly tachycardic on exam, a CTAof the chest was obtained which showed bilateral pulmonary emboli in all lobes of both lungs and findings suggestive of RV strain. Patient endorsed tobacco use of half pack per day. She denies any recent travel but notes that she has been more sedentary because of arthritic pain. Denies recent surgeries. No personal history of clots. She thinks that her father might have had some kind of blood disorder but is unsure.  CBC showed no leukocytosis and had mild anemia of 11.5. CMP showed glucose of 128, creatinine of 0.82. Troponin of 12.  Heparin infusion was started. She was also given a total of 8 mg of morphine for her left calf  pain but feels that the pain is still the same and has not resolved.  Endorse tobacco use of half a pack per day. Denies tobacco or illicit drug use  12/4: Continued heparin infusion.  Patient endorsing pain in left leg.  Multimodal pain regimen  12/5: Continues to endorse pain and left leg.  Remains on heparin infusion.  12/6: Patient taken off heparin infusion.  Eliquis started at treatment dose of 10 mg twice daily x7 days followed by 5 mg twice daily for 3 to 6 months.  Lengthy conversation with patient regarding the importance medication adherence to which she agreed.  Patient is also recommended to seek consultation with her primary care physician and possibly inquire about referral to hematology to discuss different treatment consideration considering her extensive clot burden.  She also has a pain management doctor and I encouraged her to discuss medication changes with the pain management physician at the next visit.  She agreed to all of my post discharge instructions.  All questions were answered.  Patient was discharged in stable condition.   Discharge Diagnoses:  Principal Problem:   Pulmonary embolism (HCC) Active Problems:   Morbid obesity (HCC)   S/P total thyroidectomy   DVT (deep venous thrombosis) (HCC)   Do not resuscitate status   Pulmonary emboli [facility])    Discharge Instructions  Discharge Instructions    Diet - low sodium heart healthy   Complete by: As directed    Discharge instructions   Complete by: As directed    See your primary care within 7-10 days   Increase activity slowly   Complete by: As directed      Allergies as  of 10/21/2019      Reactions   Penicillins Itching, Rash   Did it involve swelling of the face/tongue/throat, SOB, or low BP? No Did it involve sudden or severe rash/hives, skin peeling, or any reaction on the inside of your mouth or nose? No Did you need to seek medical attention at a hospital or doctor's office? No When did it  last happen?10+ years ago If all above answers are "NO", may proceed with cephalosporin use.      Medication List    TAKE these medications   apixaban 5 MG Tabs tablet Commonly known as: Eliquis Take 2 tablets ( ) twice daily for 7 days, then 1 tablet ( ) twice daily   baclofen 10 MG tablet Commonly known as: LIORESAL Take 10 mg by mouth every 8 (eight) hours as needed.   gabapentin 600 MG tablet Commonly known as: NEURONTIN Take 0.5 tablets (300 mg total) by mouth 3 (three) times daily for 7 days.   levothyroxine 200 MCG tablet Commonly known as: SYNTHROID Take 200 mcg by mouth daily.   meloxicam 7.5 MG tablet Commonly known as: MOBIC Take 1 tablet (7.5 mg total) by mouth daily.   oxyCODONE-acetaminophen 5-325 MG tablet Commonly known as: Percocet Take 1 tablet by mouth every 4 (four) hours as needed for severe pain.   topiramate 25 MG tablet Commonly known as: Topamax Take 25 mg before bed for one week. Then take 50 mg before bed for one week. Then 75 mg before bed for one week. Then 1000 mg before bed onward. What changed:   how much to take  how to take this  when to take this  additional instructions      Follow-up Information    Schedule an appointment as soon as possible for a visit  with Trey Sailors, PA-C.   Specialty: Physician Assistant Contact information: [address] [place] [address] [phone]          Allergies  Allergen Reactions  . Penicillins Itching and Rash    Did it involve swelling of the face/tongue/throat, SOB, or low BP? No Did it involve sudden or severe rash/hives, skin peeling, or any reaction on the inside of your mouth or nose? No Did you need to seek medical attention at a hospital or doctor's office? No When did it last happen?10+ years ago If all above answers are "NO", may proceed with cephalosporin use.     Consultations:  None   Procedures/Studies: Ct Angio Chest  Pe W And/or Wo Contrast  Result Date: 10/19/2019 CLINICAL DATA:  Left leg pain EXAM: CT ANGIOGRAPHY CHEST WITH CONTRAST TECHNIQUE: Multidetector CT imaging of the chest was performed using the standard protocol during bolus administration of intravenous contrast. Multiplanar CT image reconstructions and MIPs were obtained to evaluate the vascular anatomy. CONTRAST:  OMNIPAQUE IOHEXOL 350 MG/ML SOLN COMPARISON:  09/13/2012 FINDINGS: Cardiovascular: There are bilateral pulmonary emboli noted in all lobes of both lungs. The largest are lobar and segmental emboli in the lower lobes. The RV: LV ratio is elevated at 1.25 suggesting right heart strain. Aorta is normal caliber. Mediastinum/Nodes: No mediastinal, hilar, or axillary adenopathy. Lungs/Pleura: Airspace opacities in the lingula and left lower lobe could reflect early pulmonary infarcts. Cannot completely exclude infection. Recommend clinical correlation. No effusions. Upper Abdomen: Imaging into the upper abdomen shows no acute findings. Musculoskeletal: Chest wall soft tissues are unremarkable. No acute bony abnormality. Review of the MIP images confirms the above findings. IMPRESSION: Extensive bilateral  pulmonary emboli. CT evidence of right heart strain (RV/LV Ratio = 1.25) consistent with at least submassive (intermediate risk) PE. The presence of right heart strain has been associated with an increased risk of morbidity and mortality. Please activate Code PE by paging [phone]. Critical Value/emergent results were called by telephone at the time of interpretation on 10/19/2019 at 12:13 am to the ER physician, who verbally acknowledged these results. Electronically Signed   By: Rolm Baptise M.D.   On: 10/19/2019 00:15   US Venous Img Lower Unilateral Left  Result Date: 10/17/2019 CLINICAL DATA:  Left leg pain EXAM: LEFT LOWER EXTREMITY VENOUS DOPPLER ULTRASOUND TECHNIQUE: Gray-scale sonography with graded compression, as well as color Doppler  and duplex ultrasound were performed to evaluate the lower extremity deep venous systems from the level of the common femoral vein and including the common femoral, femoral, profunda femoral, popliteal and calf veins including the posterior tibial, peroneal and gastrocnemius veins when visible. The superficial great saphenous vein was also interrogated. Spectral Doppler was utilized to evaluate flow at rest and with distal augmentation maneuvers in the common femoral, femoral and popliteal veins. COMPARISON:  None. FINDINGS: Contralateral Common Femoral Vein: Respiratory phasicity is normal and symmetric with the symptomatic side. No evidence of thrombus. Normal compressibility. Common Femoral Vein: No evidence of thrombus. Normal compressibility, respiratory phasicity and response to augmentation. Saphenofemoral Junction: No evidence of thrombus. Normal compressibility and flow on color Doppler imaging. Profunda Femoral Vein: No evidence of thrombus. Normal compressibility and flow on color Doppler imaging. Femoral Vein: No evidence of thrombus. Normal compressibility, respiratory phasicity and response to augmentation. Popliteal Vein: Occlusive thrombus noted in the left popliteal vein. No flow visualized. Calf Veins: Not well visualized. Superficial Great Saphenous Vein: No evidence of thrombus. Normal compressibility. Venous Reflux:  None. Other Findings:  None. IMPRESSION: Acute deep venous thrombosis within the left popliteal vein. Electronically Signed   By: Rolm Baptise M.D.   On: 10/17/2019 23:53       Subjective: Seen and examined at the time of discharge Pain improved No new complaints All vital signs stable  Discharge Exam: Vitals:   10/20/19 2216 10/21/19 0354  BP:  116/67  Pulse:  79  Resp:  20  Temp:  97.7 F (36.5 C)  SpO2: 97% 98%   Vitals:   10/20/19 1500 10/20/19 2026 10/20/19 2216 10/21/19 0354  BP: 127/75 124/69  116/67  Pulse: 87 90  79  Resp: 20 20  20   Temp: 98.6 F  (37 C) 98.4 F (36.9 C)  97.7 F (36.5 C)  TempSrc: Oral Oral  Oral  SpO2: 100% 96% 97% 98%  Weight:    (!) 197.6 kg  Height:       General exam: Appears calm and comfortable, obese Respiratory system: Clear to auscultation. Respiratory effort normal. Cardiovascular system: S1 & S2 heard, RRR. No JVD, murmurs, rubs, gallops or clicks. No pedal edema. Gastrointestinal system: Abdomen is nondistended, soft and nontender. No organomegaly or masses felt. Normal bowel sounds heard. Central nervous system: Alert and oriented. No focal neurological deficits. Extremities: Tender to palpation and decreased range of motion of left lower extremity Skin: No rashes, lesions or ulcers Psychiatry: Judgement and insight appear normal. Mood & affect appropriate.       The results of significant diagnostics from this hospitalization (including imaging, microbiology, ancillary and laboratory) are listed below for reference.     Microbiology: Recent Results (from the past 240 hour(s))  SARS CORONAVIRUS 2 (TAT 6-24 HRS)  Nasopharyngeal Nasopharyngeal Swab     Status: None   Collection Time: 10/19/19 12:44 AM   Specimen: Nasopharyngeal Swab  Result Value Ref Range Status   SARS Coronavirus 2 NEGATIVE NEGATIVE Final    Comment: (NOTE) SARS-CoV-2 target nucleic acids are NOT DETECTED. The SARS-CoV-2 RNA is generally detectable in upper and lower respiratory specimens during the acute phase of infection. Negative results do not preclude SARS-CoV-2 infection, do not rule out co-infections with other pathogens, and should not be used as the sole basis for treatment or other patient management decisions. Negative results must be combined with clinical observations, patient history, and epidemiological information. The expected result is Negative. Fact Sheet for Patients: HairSlick.no Fact Sheet for Healthcare Providers: quierodirigir.com This  test is not yet approved or cleared by the [place] FDA and  has been authorized for detection and/or diagnosis of SARS-CoV-2 by FDA under an Emergency Use Authorization (EUA). This EUA will remain  in effect (meaning this test can be used) for the duration of the COVID-19 declaration under Section 56 4(b)(1) of the Act, 21 U.S.C. section 360bbb-3(b)(1), unless the authorization is terminated or revoked sooner. Performed at [facility], [address], [place], [place] [address]      Labs: BNP (last 3 results) Recent Labs    10/18/19 2153  BNP 23.0   Basic Metabolic Panel: Recent Labs  Lab 10/18/19 2153 10/19/19 0109  NA 136 139  K 3.5 3.4*  CL 103 104  CO2 24 25  GLUCOSE 129* 142*  BUN 11 10  CREATININE 0.82 0.93  CALCIUM 8.3* 8.2*   Liver Function Tests: Recent Labs  Lab 10/18/19 2153  AST 13*  ALT 13  ALKPHOS 85  BILITOT 0.7  PROT 8.1  ALBUMIN 3.3*   No results for input(s): LIPASE, AMYLASE in the last 168 hours. No results for input(s): AMMONIA in the last 168 hours. CBC: Recent Labs  Lab 10/18/19 2153 10/19/19 0109 10/19/19 0518 10/20/19 0509 10/21/19 0553  WBC 8.4 8.5 7.8 8.8 5.3  HGB 11.5* 12.0 10.8* 8.6* 10.2*  HCT 36.5 36.9 33.6* 26.4* 31.0*  MCV 85.3 83.1 84.2 82.8 82.2  PLT 233 227 236 271 242   Cardiac Enzymes: No results for input(s): CKTOTAL, CKMB, CKMBINDEX, TROPONINI in the last 168 hours. BNP: Invalid input(s): POCBNP CBG: No results for input(s): GLUCAP in the last 168 hours. D-Dimer No results for input(s): DDIMER in the last 72 hours. Hgb A1c No results for input(s): HGBA1C in the last 72 hours. Lipid Profile No results for input(s): CHOL, HDL, LDLCALC, TRIG, CHOLHDL, LDLDIRECT in the last 72 hours. Thyroid function studies Recent Labs    10/19/19 0109  TSH 14.222*   Anemia work up No results for input(s): VITAMINB12, FOLATE, FERRITIN, TIBC, IRON, RETICCTPCT in the last 72 hours. Urinalysis    Component  Value Date/Time   COLORURINE YELLOW (A) 02/16/2017 2009   APPEARANCEUR HAZY (A) 02/16/2017 2009   APPEARANCEUR Clear 07/16/2013 1339   LABSPEC 1.020 02/16/2017 2009   LABSPEC 1.014 07/16/2013 1339   PHURINE 8.0 02/16/2017 2009   GLUCOSEU NEGATIVE 02/16/2017 2009   GLUCOSEU Negative 07/16/2013 1339   HGBUR NEGATIVE 02/16/2017 2009   BILIRUBINUR NEGATIVE 02/16/2017 2009   BILIRUBINUR Negative 07/16/2013 1339   KETONESUR NEGATIVE 02/16/2017 2009   PROTEINUR 30 (A) 02/16/2017 2009   NITRITE NEGATIVE 02/16/2017 2009   LEUKOCYTESUR NEGATIVE 02/16/2017 2009   LEUKOCYTESUR Negative 07/16/2013 1339   Sepsis Labs Invalid input(s): PROCALCITONIN,  WBC,  LACTICIDVEN  Microbiology Recent Results (from the past 240 hour(s))  SARS CORONAVIRUS 2 (TAT 6-24 HRS) Nasopharyngeal Nasopharyngeal Swab     Status: None   Collection Time: 10/19/19 12:44 AM   Specimen: Nasopharyngeal Swab  Result Value Ref Range Status   SARS Coronavirus 2 NEGATIVE NEGATIVE Final    Comment: (NOTE) SARS-CoV-2 target nucleic acids are NOT DETECTED. The SARS-CoV-2 RNA is generally detectable in upper and lower respiratory specimens during the acute phase of infection. Negative results do not preclude SARS-CoV-2 infection, do not rule out co-infections with other pathogens, and should not be used as the sole basis for treatment or other patient management decisions. Negative results must be combined with clinical observations, patient history, and epidemiological information. The expected result is Negative. Fact Sheet for Patients: HairSlick.nohttps://www.fda.gov/media/138098/download Fact Sheet for Healthcare Providers: quierodirigir.comhttps://www.fda.gov/media/138095/download This test is not yet approved or cleared by the [place] FDA and  has been authorized for detection and/or diagnosis of SARS-CoV-2 by FDA under an Emergency Use Authorization (EUA). This EUA will remain  in effect (meaning this test can be used) for the duration of  the COVID-19 declaration under Section 56 4(b)(1) of the Act, 21 U.S.C. section 360bbb-3(b)(1), unless the authorization is terminated or revoked sooner. Performed at [facility], [address] [place], [place] [address]      Time coordinating discharge: Over 30 minutes  SIGNED:   Tresa MooreSudheer B Jorey Dollard, MD  [facility] 10/21/2019, 1:10 PM Pager [phone]  If 7PM-7AM, please contact night-coverage [email or web address] Password [email or web address]

## 2019-10-22 ENCOUNTER — Telehealth: Payer: Self-pay

## 2019-10-22 NOTE — Telephone Encounter (Signed)
Transition Care Management Follow-up Telephone Call  Date of discharge and from where: [facility] on 10/21/19  How have you been since you were released from the hospital? Doing ok but still has pain in the left leg and chest. Pain level is currently around a 7 or 8. Pt is taking pain medication as prescribed. The left leg is still swollen, tight and warm to touch. Pt is still having SOB with movement. Declines chest tightness, wound drainage, weakness fatigue or n/v/d.   Any questions or concerns? No   Items Reviewed:  Did the pt receive and understand the discharge instructions provided? Yes   Medications obtained and verified? No, pt declined reviewing all medications at this time. Did verify the newly prescribed medications.   Any new allergies since your discharge? No   Dietary orders reviewed? Yes  Do you have support at home? Yes   Other (ie: DME, Home Health, etc) N/A  Functional Questionnaire: (I = Independent and D = Dependent)  Bathing/Dressing- D, significant other helps dress and bath due to pain and arthritis.    Meal Prep- I  Eating- I  Maintaining continence- I  Transferring/Ambulation- I currently, pt is looking into getting a cane for assistance walking.   Managing Meds- I   Follow up appointments reviewed:    PCP Hospital f/u appt confirmed? Yes  Scheduled to see Carles Collet on 10/24/19 @ 8:20 AM.  [facility] f/u appt confirmed? N/A   Are transportation arrangements needed? No   If their condition worsens, is the pt aware to call  their PCP or go to the ED? Yes  Was the patient provided with contact information for the PCP's office or ED? Yes  Was the pt encouraged to call back with questions or concerns? Yes

## 2019-10-22 NOTE — Telephone Encounter (Signed)
No HFU scheduled.  

## 2019-10-23 NOTE — Progress Notes (Signed)
Patient: Kimberly Nolan Female    DOB: 1986/07/18   33 y.o.   MRN: [id number] Visit Date: 10/24/2019  Today's Provider: Trey Sailors, PA-C   Chief Complaint  Patient presents with  . Follow-up   Subjective:    I, Sulibeya S. Dimas, CMA, am acting as a Neurosurgeon for [employer], PA-C.   HPI  Follow up Hospitalization  Patient was admitted to [facility] on 10/18/2019 and discharged on 10/21/2019. Initially presented to the ER on 10/17/2019 and underwent left leg ultrasound which showed DVT. Patient had left due to overcrowding and went to a walk in orthopedic clinic at [facility]. The provider there sent her back to the hospital.  She was treated for DVT and pulmonary embolism. Treatment for this included labs heparin in the hospital which was then transitioned to Eliquis upon discharge.  Telephone follow up was done on 10/22/2019. She reports excellent compliance with treatment. She reports this condition is Unchanged. Patient reports she is still having some chest pain, leg pain and shortness of breath with activity. She reports she has picked up eliquis and is taking this as instructed. She reports she is not smoking.   Wt Readings from Last 3 Encounters:  10/24/19 (!) 440 lb (199.6 kg)  10/21/19 (!) 435 lb 9.6 oz (197.6 kg)  10/17/19 (!) 420 lb (190.5 kg)   BP Readings from Last 3 Encounters:  10/24/19 (!) 146/85  10/21/19 116/67  10/17/19 (!) 160/104   Pulse Readings from Last 3 Encounters:  10/24/19 82  10/21/19 79  10/17/19 (!) 107    ------------------------------------------------------------------------------------     Allergies  Allergen Reactions  . Penicillins Itching and Rash    Did it involve swelling of the face/tongue/throat, SOB, or low BP? No Did it involve sudden or severe rash/hives, skin peeling, or any reaction on the inside of your mouth or nose? No Did you need to seek medical attention at a hospital or doctor's office? No When did  it last happen?10+ years ago If all above answers are "NO", may proceed with cephalosporin use.      Current Outpatient Medications:  .  apixaban (ELIQUIS) 5 MG TABS tablet, Take 2 tablets (10mg ) twice daily for 7 days, then 1 tablet (5mg ) twice daily, Disp: 60 tablet, Rfl: 0 .  baclofen (LIORESAL) 10 MG tablet, Take 10 mg by mouth every 8 (eight) hours as needed., Disp: , Rfl:  .  gabapentin (NEURONTIN) 600 MG tablet, Take 0.5 tablets (300 mg total) by mouth 3 (three) times daily for 7 days. (Patient taking differently: Take 300 mg by mouth 2 (two) times daily. ), Disp: 10.5 tablet, Rfl: 0 .  levothyroxine (SYNTHROID) 200 MCG tablet, Take 200 mcg by mouth daily., Disp: , Rfl:  .  meloxicam (MOBIC) 7.5 MG tablet, Take 1 tablet (7.5 mg total) by mouth daily., Disp: 30 tablet, Rfl: 0 .  oxyCODONE-acetaminophen (PERCOCET) 5-325 MG tablet, Take 1 tablet by mouth every 4 (four) hours as needed for severe pain., Disp: 20 tablet, Rfl: 0 .  topiramate (TOPAMAX) 25 MG tablet, Take 25 mg before bed for one week. Then take 50 mg before bed for one week. Then 75 mg before bed for one week. Then 1000 mg before bed onward. (Patient not taking: Reported on 10/24/2019), Disp: 180 tablet, Rfl: 0  Review of Systems  Constitutional: Negative.   Respiratory: Positive for cough and shortness of breath.   Cardiovascular: Positive for leg swelling.  Neurological: Negative.  Social History   Tobacco Use  . Smoking status: Current Every Day Smoker    Packs/day: 0.50    Years: 3.00    Pack years: 1.50    Types: Cigarettes  . Smokeless tobacco: Never Used  Substance Use Topics  . Alcohol use: Not Currently    Comment: Occasionally      Objective:   BP (!) 146/85 (BP Location: Left Arm, Patient Position: Sitting, Cuff Size: Large)   Pulse 82   Temp (!) 97.5 F (36.4 C) (Temporal)   Resp 16   Ht 5\' 7"  (1.702 m)   Wt (!) 440 lb (199.6 kg)   LMP 10/17/2019   SpO2 97%   BMI 68.91 kg/m   Vitals:   10/24/19 0825  BP: (!) 146/85  Pulse: 82  Resp: 16  Temp: (!) 97.5 F (36.4 C)  TempSrc: Temporal  SpO2: 97%  Weight: (!) 440 lb (199.6 kg)  Height: 5\' 7"  (1.702 m)  Body mass index is 68.91 kg/m.   Physical Exam Constitutional:      Appearance: Normal appearance. She is obese.     Comments: Severely obese patient in a wheelchair pushed by her mother.   Cardiovascular:     Rate and Rhythm: Normal rate and regular rhythm.     Heart sounds: Normal heart sounds.  Pulmonary:     Effort: Pulmonary effort is normal.     Breath sounds: Normal breath sounds.  Musculoskeletal:     Right lower leg: No edema.     Left lower leg: No edema.  Skin:    General: Skin is warm and dry.  Neurological:     Mental Status: She is alert and oriented to person, place, and time. Mental status is at baseline.  Psychiatric:        Mood and Affect: Mood normal.        Behavior: Behavior normal.      No results found for any visits on 10/24/19.     Assessment & Plan    1. Acute deep vein thrombosis (DVT) of proximal vein of left lower extremity (HCC)  She is currently on Eliquis. I have explained that she will likely be on this medication for at least 3-6 months for a provoked clot or longer if unprovoked or workup shows clotting disorder. I am inclined to think this is provoked as patient is morbidly obese, smokes, and has had reduced mobility. The past two visits the patient has presented in a wheelchair which she states is due to pain. Review of her notes from [facility] shows she reported she cannot hardly walk to the treating physician.   Patient expresses anger and disbelief when I discuss this today. She adamantly denies she is sedentary at home and states she is on her feet nearly all day and she does not think this is the cause of her blood clots. This seems to be in direct contradiction to how she has presented in clinic as well as what she has reported to other providers.  She states she has future visits with pain management at [facility], whom she states has told her nothing of relevance on her previous visits. She is receiving narcotic pain medication from [facility].   In any event, she will need to continue the Eliquis which I have reviewed with her. I have placed a hematology referral today. If there are no appointments before her Eliquis runs out, I will refill it.   - Ambulatory referral to Hematology /  Oncology  2. Acute pulmonary embolism with acute cor pulmonale, unspecified pulmonary embolism type [facility])  - Ambulatory referral to Hematology / Oncology  The entirety of the information documented in the History of Present Illness, Review of Systems and Physical Exam were personally obtained by me. Portions of this information were initially documented by Lynford Humphrey, CMA and reviewed by me for thoroughness and accuracy.   F/u PRN    Trinna Post, PA-C  [facility]

## 2019-10-24 ENCOUNTER — Other Ambulatory Visit: Payer: Self-pay

## 2019-10-24 ENCOUNTER — Ambulatory Visit: Payer: Medicaid Other | Admitting: Physician Assistant

## 2019-10-24 ENCOUNTER — Encounter: Payer: Self-pay | Admitting: Physician Assistant

## 2019-10-24 VITALS — BP 146/85 | HR 82 | Temp 97.5°F | Resp 16 | Ht 67.0 in | Wt >= 6400 oz

## 2019-10-24 DIAGNOSIS — I2609 Other pulmonary embolism with acute cor pulmonale: Secondary | ICD-10-CM

## 2019-10-24 DIAGNOSIS — I824Y2 Acute embolism and thrombosis of unspecified deep veins of left proximal lower extremity: Secondary | ICD-10-CM

## 2019-10-24 NOTE — Patient Instructions (Signed)

## 2019-10-30 ENCOUNTER — Other Ambulatory Visit: Payer: Self-pay

## 2019-10-30 ENCOUNTER — Inpatient Hospital Stay: Payer: Medicaid Other | Attending: Oncology | Admitting: Oncology

## 2019-10-30 ENCOUNTER — Encounter: Payer: Self-pay | Admitting: Oncology

## 2019-10-30 VITALS — BP 142/79 | HR 74 | Temp 97.3°F | Resp 20 | Ht 67.0 in | Wt >= 6400 oz

## 2019-10-30 DIAGNOSIS — R5383 Other fatigue: Secondary | ICD-10-CM | POA: Diagnosis not present

## 2019-10-30 DIAGNOSIS — Z87891 Personal history of nicotine dependence: Secondary | ICD-10-CM

## 2019-10-30 DIAGNOSIS — I82462 Acute embolism and thrombosis of left calf muscular vein: Secondary | ICD-10-CM | POA: Insufficient documentation

## 2019-10-30 DIAGNOSIS — D649 Anemia, unspecified: Secondary | ICD-10-CM

## 2019-10-30 DIAGNOSIS — E039 Hypothyroidism, unspecified: Secondary | ICD-10-CM

## 2019-10-30 DIAGNOSIS — I2699 Other pulmonary embolism without acute cor pulmonale: Secondary | ICD-10-CM | POA: Insufficient documentation

## 2019-10-30 DIAGNOSIS — Z79899 Other long term (current) drug therapy: Secondary | ICD-10-CM

## 2019-10-30 DIAGNOSIS — R600 Localized edema: Secondary | ICD-10-CM

## 2019-10-30 DIAGNOSIS — Z8049 Family history of malignant neoplasm of other genital organs: Secondary | ICD-10-CM

## 2019-10-30 DIAGNOSIS — Z7901 Long term (current) use of anticoagulants: Secondary | ICD-10-CM

## 2019-10-30 NOTE — Progress Notes (Signed)
Patient here for initial visit. Pt states she has a lot of body cramps. Currenlty on xarelto 15 mg BID.

## 2019-10-30 NOTE — Progress Notes (Signed)
Hematology/Oncology Consult note [facility] Telephone:([phone] [phone]   Patient Care Team: Maryella Shivers as PCP - General (Physician Assistant) Alleen Borne, MD as Attending Physician (Cardiothoracic Surgery) Jolaine Click, MD (Inactive) as Attending Physician (Radiology)  REFERRING PROVIDER: Trey Sailors, PA-C  CHIEF COMPLAINTS/REASON FOR VISIT:  Evaluation of DVT/PE  HISTORY OF PRESENTING ILLNESS:   Kimberly Nolan is a  33 y.o.  female with PMH listed below was seen in consultation at the request of  Jodi Marble, Adriana M, PA-C  for evaluation of DVT/PE. Patient presented to ED on 10/18/2019 due increased cramping pain of left lower extremity and left before ultrasound result did.  Patient was notified about DVT results which prompted her to return to ER for evaluation of leg cramping and progressively worsening shortness of breath.  Patient endorses chronic bilateral lower extremity edema, shortness of breath with exertion. Lower extremity venous Doppler ultrasound showed DVT within the left popliteal vein. CT angiogram of the chest showed bilateral pulmonary emboli in all lobes of both lungs and the findings suggestive of RV strain.  Patient was admitted and anticoagulation with heparin was given.  Patient was discharged on Xarelto starting.  Patient currently is on Xarelto 15 mg twice daily. There was no immobilization factors which may contribute to her events. Patient has morbid obesity and has sedentary lifestyle. Patient recently stopped smoking. Today she reports that left lower extremity cramp has improved.  Continues to have bilateral lower extremity edema which is chronic for her.  Shortness of breath has improved.  Denies any bleeding events. Review of Systems  Constitutional: Positive for fatigue. Negative for appetite change, chills and fever.  HENT:   Negative for hearing loss and voice change.   Eyes: Negative for  eye problems.  Respiratory: Negative for chest tightness and cough.   Cardiovascular: Positive for leg swelling. Negative for chest pain.  Gastrointestinal: Negative for abdominal distention, abdominal pain and blood in stool.  Endocrine: Negative for hot flashes.  Genitourinary: Negative for difficulty urinating and frequency.   Musculoskeletal: Negative for arthralgias.  Skin: Negative for itching and rash.  Neurological: Negative for extremity weakness.  Hematological: Negative for adenopathy.  Psychiatric/Behavioral: Negative for confusion.    MEDICAL HISTORY:  Past Medical History:  Diagnosis Date  . Anginal pain (HCC)   . Dyspnea   . Essential hypertension   . Family history of adverse reaction to anesthesia    Mother slow to wake up after surgery  . Graves disease   . Lower extremity edema   . Morbid obesity (HCC)   . Smoker     SURGICAL HISTORY: Past Surgical History:  Procedure Laterality Date  . CESAREAN SECTION    . PARATHYROIDECTOMY  06/27/2019   Procedure: PARATHYROIDECTOMY AUTOTRANSPLANT;  Surgeon: Duanne Guess, MD;  Location: [facility] ORS;  Service: General;;  . THYROIDECTOMY N/A 06/27/2019   Procedure: TOTAL THYROIDECTOMY ;  Surgeon: Duanne Guess, MD;  Location: [facility] ORS;  Service: General;  Laterality: N/A;    SOCIAL HISTORY: Social History   Socioeconomic History  . Marital status: Married    Spouse name: Not on file  . Number of children: Not on file  . Years of education: Not on file  . Highest education level: Not on file  Occupational History  . Not on file  Tobacco Use  . Smoking status: Former Smoker    Packs/day: 0.50    Years: 3.00    Pack years: 1.50    Types:  Cigarettes    Quit date: 10/16/2019    Years since quitting: 0.0  . Smokeless tobacco: Never Used  Substance and Sexual Activity  . Alcohol use: Not Currently    Comment: Occasionally  . Drug use: No  . Sexual activity: Yes    Partners: Male  Other Topics Concern  . Not  on file  Social History Narrative  . Not on file   Social Determinants of Health   Financial Resource Strain: Unknown  . Difficulty of Paying Living Expenses: Patient refused  Food Insecurity: Unknown  . Worried About Charity fundraiser in the Last Year: Patient refused  . Ran Out of Food in the Last Year: Patient refused  Transportation Needs:   . Film/video editor (Medical): Not on file  . Lack of Transportation (Non-Medical): Not on file  Physical Activity: Unknown  . Days of Exercise per Week: Patient refused  . Minutes of Exercise per Session: Patient refused  Stress: Unknown  . Feeling of Stress : Patient refused  Social Connections: Unknown  . Frequency of Communication with Friends and Family: Patient refused  . Frequency of Social Gatherings with Friends and Family: Patient refused  . Attends Religious Services: Patient refused  . Active Member of Clubs or Organizations: Patient refused  . Attends Archivist Meetings: Patient refused  . Marital Status: Patient refused  Intimate Partner Violence: Unknown  . Fear of Current or Ex-Partner: Not asked  . Emotionally Abused: Not asked  . Physically Abused: Not asked  . Sexually Abused: Not asked    FAMILY HISTORY: Family History  Problem Relation Age of Onset  . Thyroid disease Mother   . Hypertension Father   . Diabetes Brother   . Hypertension Brother   . Brain cancer Maternal Aunt   . Prostate cancer Maternal Grandfather     ALLERGIES:  is allergic to penicillins.  MEDICATIONS:  Current Outpatient Medications  Medication Sig Dispense Refill  . baclofen (LIORESAL) 10 MG tablet Take 10 mg by mouth every 8 (eight) hours as needed.    Marland Kitchen levothyroxine (SYNTHROID) 200 MCG tablet Take 200 mcg by mouth daily.    . meloxicam (MOBIC) 7.5 MG tablet Take 1 tablet (7.5 mg total) by mouth daily. 30 tablet 0  . oxyCODONE-acetaminophen (PERCOCET) 5-325 MG tablet Take 1 tablet by mouth every 4 (four) hours as  needed for severe pain. 20 tablet 0  . Rivaroxaban (XARELTO) 15 MG TABS tablet Take 15 mg by mouth 2 (two) times daily with a meal.    . topiramate (TOPAMAX) 25 MG tablet Take 25 mg before bed for one week. Then take 50 mg before bed for one week. Then 75 mg before bed for one week. Then 1000 mg before bed onward. 180 tablet 0  . apixaban (ELIQUIS) 5 MG TABS tablet Take 2 tablets (10mg ) twice daily for 7 days, then 1 tablet (5mg ) twice daily (Patient not taking: Reported on 10/30/2019) 60 tablet 0  . calcium-vitamin D (OSCAL WITH D) 500-200 MG-UNIT TABS tablet Take by mouth.    . gabapentin (NEURONTIN) 600 MG tablet Take 0.5 tablets (300 mg total) by mouth 3 (three) times daily for 7 days. (Patient taking differently: Take 300 mg by mouth 2 (two) times daily. ) 10.5 tablet 0   No current facility-administered medications for this visit.     PHYSICAL EXAMINATION: ECOG PERFORMANCE STATUS: 1 - Symptomatic but completely ambulatory Vitals:   10/30/19 1147  BP: (!) 142/79  Pulse: 74  Resp: 20  Temp: (!) 97.3 F (36.3 C)   Filed Weights   10/30/19 1147  Weight: (!) 430 lb 3.2 oz (195.1 kg)    Physical Exam Constitutional:      General: She is not in acute distress.    Appearance: She is obese.  HENT:     Head: Normocephalic and atraumatic.  Eyes:     General: No scleral icterus.    Pupils: Pupils are equal, round, and reactive to light.  Cardiovascular:     Rate and Rhythm: Normal rate and regular rhythm.     Heart sounds: Normal heart sounds.  Pulmonary:     Effort: Pulmonary effort is normal. No respiratory distress.     Breath sounds: No wheezing.     Comments: Decreased breath sound bilaterally Abdominal:     General: Bowel sounds are normal. There is no distension.     Palpations: Abdomen is soft. There is no mass.     Tenderness: There is no abdominal tenderness.  Musculoskeletal:        General: Swelling present. No deformity. Normal range of motion.     Cervical  back: Normal range of motion and neck supple.     Comments: Chronic bilateral lower extremity edema  Skin:    General: Skin is warm and dry.     Findings: No erythema or rash.  Neurological:     Mental Status: She is alert and oriented to person, place, and time.     Cranial Nerves: No cranial nerve deficit.     Coordination: Coordination normal.  Psychiatric:        Behavior: Behavior normal.        Thought Content: Thought content normal.     LABORATORY DATA:  I have reviewed the data as listed Lab Results  Component Value Date   WBC 5.3 10/21/2019   HGB 10.2 (L) 10/21/2019   HCT 31.0 (L) 10/21/2019   MCV 82.2 10/21/2019   PLT 242 10/21/2019   Recent Labs    06/04/19 1455 06/27/19 2144 06/28/19 0443 10/18/19 2153 10/19/19 0109  NA 138  --   --  136 139  K 4.1  --   --  3.5 3.4*  CL 104  --   --  103 104  CO2 26  --   --  24 25  GLUCOSE 95  --   --  129* 142*  BUN 10  --   --  11 10  CREATININE 0.65  --   --  0.82 0.93  CALCIUM 8.9 8.3* 8.4* 8.3* 8.2*  GFRNONAA >60  --   --  >60 >60  GFRAA >60  --   --  >60 >60  PROT  --   --   --  8.1  --   ALBUMIN  --  3.0* 2.8* 3.3*  --   AST  --   --   --  13*  --   ALT  --   --   --  13  --   ALKPHOS  --   --   --  85  --   BILITOT  --   --   --  0.7  --    Iron/TIBC/Ferritin/ %Sat No results found for: IRON, TIBC, FERRITIN, IRONPCTSAT    RADIOGRAPHIC STUDIES: I have personally reviewed the radiological images as listed and agreed with the findings in the report.  CT Angio Chest PE W and/or Wo Contrast  Result Date: 10/19/2019 CLINICAL DATA:  Left leg pain EXAM: CT ANGIOGRAPHY CHEST WITH CONTRAST TECHNIQUE: Multidetector CT imaging of the chest was performed using the standard protocol during bolus administration of intravenous contrast. Multiplanar CT image reconstructions and MIPs were obtained to evaluate the vascular anatomy. CONTRAST:  OMNIPAQUE IOHEXOL 350 MG/ML SOLN COMPARISON:  09/13/2012 FINDINGS:  Cardiovascular: There are bilateral pulmonary emboli noted in all lobes of both lungs. The largest are lobar and segmental emboli in the lower lobes. The RV: LV ratio is elevated at 1.25 suggesting right heart strain. Aorta is normal caliber. Mediastinum/Nodes: No mediastinal, hilar, or axillary adenopathy. Lungs/Pleura: Airspace opacities in the lingula and left lower lobe could reflect early pulmonary infarcts. Cannot completely exclude infection. Recommend clinical correlation. No effusions. Upper Abdomen: Imaging into the upper abdomen shows no acute findings. Musculoskeletal: Chest wall soft tissues are unremarkable. No acute bony abnormality. Review of the MIP images confirms the above findings. IMPRESSION: Extensive bilateral pulmonary emboli. CT evidence of right heart strain (RV/LV Ratio = 1.25) consistent with at least submassive (intermediate risk) PE. The presence of right heart strain has been associated with an increased risk of morbidity and mortality. Please activate Code PE by paging [phone]. Critical Value/emergent results were called by telephone at the time of interpretation on 10/19/2019 at 12:13 am to the ER physician, who verbally acknowledged these results. Electronically Signed   By: Charlett Nose M.D.   On: 10/19/2019 00:15   US Venous Img Lower Unilateral Left  Result Date: 10/17/2019 CLINICAL DATA:  Left leg pain EXAM: LEFT LOWER EXTREMITY VENOUS DOPPLER ULTRASOUND TECHNIQUE: Gray-scale sonography with graded compression, as well as color Doppler and duplex ultrasound were performed to evaluate the lower extremity deep venous systems from the level of the common femoral vein and including the common femoral, femoral, profunda femoral, popliteal and calf veins including the posterior tibial, peroneal and gastrocnemius veins when visible. The superficial great saphenous vein was also interrogated. Spectral Doppler was utilized to evaluate flow at rest and with distal augmentation  maneuvers in the common femoral, femoral and popliteal veins. COMPARISON:  None. FINDINGS: Contralateral Common Femoral Vein: Respiratory phasicity is normal and symmetric with the symptomatic side. No evidence of thrombus. Normal compressibility. Common Femoral Vein: No evidence of thrombus. Normal compressibility, respiratory phasicity and response to augmentation. Saphenofemoral Junction: No evidence of thrombus. Normal compressibility and flow on color Doppler imaging. Profunda Femoral Vein: No evidence of thrombus. Normal compressibility and flow on color Doppler imaging. Femoral Vein: No evidence of thrombus. Normal compressibility, respiratory phasicity and response to augmentation. Popliteal Vein: Occlusive thrombus noted in the left popliteal vein. No flow visualized. Calf Veins: Not well visualized. Superficial Great Saphenous Vein: No evidence of thrombus. Normal compressibility. Venous Reflux:  None. Other Findings:  None. IMPRESSION: Acute deep venous thrombosis within the left popliteal vein. Electronically Signed   By: Charlett Nose M.D.   On: 10/17/2019 23:53   ECHOCARDIOGRAM COMPLETE  Result Date: 10/21/2019   ECHOCARDIOGRAM REPORT   Patient Name:   KELIS PLASSE Date of Exam: 10/19/2019 Medical Rec #:  [id number]         Height:       67.0 in Accession #:    [id number]        Weight:       433.0 lb Date of Birth:  1986-09-02        BSA:          2.81 m Patient Age:    32 years  BP:           149/85 mmHg Patient Gender: F                 HR:           97 bpm. Exam Location:  [facility] Procedure: Cardiac Doppler and Color Doppler Indications:     Pulmonary embolus 415.19  History:         Patient has no prior history of Echocardiogram examinations.                  Signs/Symptoms:Dyspnea; Risk Factors:Hypertension. Anginal                  pain.  Sonographer:     Cristela BlueJerry Hege RDCS (AE) Referring Phys:  [id number] CHING T TU Diagnosing Phys: Alwyn Peawayne D Callwood MD IMPRESSIONS  1. Left ventricular  ejection fraction, by visual estimation, is 55 to 60%. The left ventricle has normal function. Left ventricular septal wall thickness was normal. There is no left ventricular hypertrophy.  2. Global right ventricle has normal systolic function.The right ventricular size is normal. No increase in right ventricular wall thickness.  3. Left atrial size was not well visualized.  4. Right atrial size was not well visualized.  5. The mitral valve was not well visualized. No evidence of mitral valve regurgitation.  6. The tricuspid valve is not well visualized. Tricuspid valve regurgitation is not demonstrated.  7. The aortic valve was not well visualized. Aortic valve regurgitation is not visualized.  8. The pulmonic valve was not well visualized. Pulmonic valve regurgitation is not visualized.  9. The aortic root was not well visualized. 10. The interatrial septum was not well visualized. FINDINGS  Left Ventricle: Left ventricular ejection fraction, by visual estimation, is 55 to 60%. The left ventricle has normal function. The left ventricle is not well visualized. There is no left ventricular hypertrophy. Right Ventricle: The right ventricular size is normal. No increase in right ventricular wall thickness. Global RV systolic function is has normal systolic function. Left Atrium: Left atrial size was not well visualized. Right Atrium: Right atrial size was not well visualized Pericardium: There is no evidence of pericardial effusion. Mitral Valve: The mitral valve was not well visualized. No evidence of mitral valve regurgitation. Tricuspid Valve: The tricuspid valve is not well visualized. Tricuspid valve regurgitation is not demonstrated. Aortic Valve: The aortic valve was not well visualized. Aortic valve regurgitation is not visualized. Pulmonic Valve: The pulmonic valve was not well visualized. Pulmonic valve regurgitation is not visualized. Aorta: The aortic root was not well visualized. IAS/Shunts: The  interatrial septum was not well visualized.  LEFT VENTRICLE PLAX 2D LVIDd:         4.49 cm LVIDs:         3.13 cm LV PW:         1.33 cm LV IVS:        1.35 cm LVOT diam:     2.00 cm LV SV:         53 ml LV SV Index:   16.71 LVOT Area:     3.14 cm  LEFT ATRIUM         Index LA diam:    4.60 cm 1.64 cm/m                        PULMONIC VALVE AORTA  PV Vmax:        0.76 m/s Ao Root diam: 3.00 cm PV Peak grad:   2.3 mmHg                       RVOT Peak grad: 2 mmHg   SHUNTS Systemic Diam: 2.00 cm  Dwayne Salome Arnt MD Electronically signed by Alwyn Pea MD Signature Date/Time: 10/21/2019/8:50:46 AM    Final       ASSESSMENT & PLAN:  1. Acute pulmonary embolism, unspecified pulmonary embolism type, unspecified whether acute cor pulmonale present (HCC)   2. Acute deep vein thrombosis (DVT) of calf muscle vein of left lower extremity (HCC)   3. Normocytic anemia   4. History of tobacco use in past year   5. Morbid obesity (HCC)    Images were independently reviewed by me discussed with patient.  Unprovoked lower extremity DVT and bilateral PE recommend.  Long-term anticoagulation. Patient is currently on Xarelto 15 mg twice daily after she finished the introductory phase, she will be switched to Xarelto 20 mg daily. Discussed with patient that there has been some uncertainty regarding the best antithrombotic strategies in patient with morbid obesity.  We discussed that obesity itself is a known risk factor for thrombosis. Obesity can affect drug pharmacokinetics by increasing volume of distribution and altering drug clearance as well as other pharmacodynamic effects. Given her young age, large clot burden, I would recommend hypercoagulable work-up as well.  Would prefer to check work-up in the next few weeks.  Discussed about potential switching of antithrombotic strategies if confirmed to have antiphospholipid syndrome. After discussion about the available evidence, for now,  patient prefers to continue Xarelto.  Encourage patient his effort of smoke cessation. Normocytic anemia, repeat CBC, check iron, TIBC, ferritin Morbid obesity, lifestyle modification discussed. Orders Placed This Encounter  Procedures  . Factor V leiden    Standing Status:   Future    Standing Expiration Date:   10/29/2020  . Prothrombin gene mutation    Standing Status:   Future    Standing Expiration Date:   10/29/2020  . CBC with Differential/Platelet    Standing Status:   Future    Standing Expiration Date:   10/29/2020  . Iron and TIBC    Standing Status:   Future    Standing Expiration Date:   10/29/2020  . Ferritin    Standing Status:   Future    Standing Expiration Date:   10/29/2020  . ANTIPHOSPHOLIPID SYNDROME PROF    Standing Status:   Future    Standing Expiration Date:   10/29/2020    All questions were answered. The patient knows to call the clinic with any problems questions or concerns.  cc Trey Sailors, PA-C    Return of visit: Follow-up in 4 weeks to discuss work-up results. Thank you for this kind referral and the opportunity to participate in the care of this patient. A copy of today's note is routed to referring provider  Rickard Patience, MD, PhD [facility] at [facility] Pager- [phone] 10/30/2019

## 2019-11-12 ENCOUNTER — Other Ambulatory Visit: Payer: Self-pay

## 2019-11-13 ENCOUNTER — Other Ambulatory Visit: Payer: Self-pay

## 2019-11-13 ENCOUNTER — Inpatient Hospital Stay: Payer: Medicaid Other

## 2019-11-13 DIAGNOSIS — I82462 Acute embolism and thrombosis of left calf muscular vein: Secondary | ICD-10-CM | POA: Diagnosis present

## 2019-11-13 DIAGNOSIS — I2699 Other pulmonary embolism without acute cor pulmonale: Secondary | ICD-10-CM

## 2019-11-13 LAB — CBC WITH DIFFERENTIAL/PLATELET
Abs Immature Granulocytes: 0.01 10*3/uL (ref 0.00–0.07)
Basophils Absolute: 0 10*3/uL (ref 0.0–0.1)
Basophils Relative: 0 %
Eosinophils Absolute: 0.1 10*3/uL (ref 0.0–0.5)
Eosinophils Relative: 2 %
HCT: 39.3 % (ref 36.0–46.0)
Hemoglobin: 11.8 g/dL — ABNORMAL LOW (ref 12.0–15.0)
Immature Granulocytes: 0 %
Lymphocytes Relative: 32 %
Lymphs Abs: 1.8 10*3/uL (ref 0.7–4.0)
MCH: 26.5 pg (ref 26.0–34.0)
MCHC: 30 g/dL (ref 30.0–36.0)
MCV: 88.1 fL (ref 80.0–100.0)
Monocytes Absolute: 0.5 10*3/uL (ref 0.1–1.0)
Monocytes Relative: 8 %
Neutro Abs: 3.3 10*3/uL (ref 1.7–7.7)
Neutrophils Relative %: 58 %
Platelets: 267 10*3/uL (ref 150–400)
RBC: 4.46 MIL/uL (ref 3.87–5.11)
RDW: 14.3 % (ref 11.5–15.5)
WBC: 5.7 10*3/uL (ref 4.0–10.5)
nRBC: 0 % (ref 0.0–0.2)

## 2019-11-13 LAB — IRON AND TIBC
Iron: 33 ug/dL (ref 28–170)
Saturation Ratios: 8 % — ABNORMAL LOW (ref 10.4–31.8)
TIBC: 399 ug/dL (ref 250–450)
UIBC: 366 ug/dL

## 2019-11-13 LAB — FERRITIN: Ferritin: 25 ng/mL (ref 11–307)

## 2019-11-15 LAB — FACTOR 5 LEIDEN

## 2019-11-17 LAB — ANTIPHOSPHOLIPID SYNDROME PROF
Anticardiolipin IgG: <9 GPL U/mL (ref 0–14)
Anticardiolipin IgM: 10 MPL U/mL (ref 0–12)
DRVVT: 47.2 s — ABNORMAL HIGH (ref 0.0–47.0)
PTT Lupus Anticoagulant: 39.6 s (ref 0.0–51.9)

## 2019-11-17 LAB — DRVVT CONFIRM: dRVVT Confirm: 1.1 ratio (ref 0.8–1.2)

## 2019-11-17 LAB — DRVVT MIX: dRVVT Mix: 43.4 s — ABNORMAL HIGH (ref 0.0–40.4)

## 2019-11-19 ENCOUNTER — Telehealth: Payer: Self-pay | Admitting: Oncology

## 2019-11-19 ENCOUNTER — Other Ambulatory Visit: Payer: Self-pay | Admitting: Oncology

## 2019-11-19 ENCOUNTER — Encounter: Payer: Self-pay | Admitting: Oncology

## 2019-11-19 LAB — PROTHROMBIN GENE MUTATION

## 2019-11-19 MED ORDER — RIVAROXABAN 20 MG PO TABS: 20.0000 mg | ORAL_TABLET | Freq: Every day | ORAL | 1 refills | Status: DC

## 2019-11-19 NOTE — Telephone Encounter (Signed)
Called patient and communicated with her about lab results. Patient is currently on Xarelto 20 mg daily and she has finished her supply.  She was initially discharged on Eliquis and she did not know which doctor switched her to Xarelto. She has finished the introductory phase of Xarelto 50 mg twice daily and has been taking Xarelto 20 mg daily.  Refill of Xarelto 20 mg was sent to pharmacy.  Patient has a virtual visit with me will discuss further details about her lab results.

## 2019-11-21 ENCOUNTER — Encounter: Payer: Self-pay | Admitting: Physician Assistant

## 2019-11-21 DIAGNOSIS — G8929 Other chronic pain: Secondary | ICD-10-CM

## 2019-11-21 DIAGNOSIS — M545 Low back pain, unspecified: Secondary | ICD-10-CM

## 2019-11-26 ENCOUNTER — Other Ambulatory Visit: Payer: Self-pay

## 2019-11-26 ENCOUNTER — Encounter: Payer: Self-pay | Admitting: Oncology

## 2019-11-26 ENCOUNTER — Inpatient Hospital Stay: Payer: Medicaid Other | Attending: Oncology | Admitting: Oncology

## 2019-11-26 DIAGNOSIS — Z86711 Personal history of pulmonary embolism: Secondary | ICD-10-CM | POA: Insufficient documentation

## 2019-11-26 DIAGNOSIS — I10 Essential (primary) hypertension: Secondary | ICD-10-CM | POA: Insufficient documentation

## 2019-11-26 DIAGNOSIS — I82462 Acute embolism and thrombosis of left calf muscular vein: Secondary | ICD-10-CM

## 2019-11-26 DIAGNOSIS — Z8349 Family history of other endocrine, nutritional and metabolic diseases: Secondary | ICD-10-CM | POA: Insufficient documentation

## 2019-11-26 DIAGNOSIS — D649 Anemia, unspecified: Secondary | ICD-10-CM | POA: Insufficient documentation

## 2019-11-26 DIAGNOSIS — Z79899 Other long term (current) drug therapy: Secondary | ICD-10-CM | POA: Insufficient documentation

## 2019-11-26 DIAGNOSIS — Z7901 Long term (current) use of anticoagulants: Secondary | ICD-10-CM | POA: Insufficient documentation

## 2019-11-26 DIAGNOSIS — Z8042 Family history of malignant neoplasm of prostate: Secondary | ICD-10-CM | POA: Insufficient documentation

## 2019-11-26 DIAGNOSIS — I2699 Other pulmonary embolism without acute cor pulmonale: Secondary | ICD-10-CM

## 2019-11-26 DIAGNOSIS — Z87891 Personal history of nicotine dependence: Secondary | ICD-10-CM | POA: Insufficient documentation

## 2019-11-26 DIAGNOSIS — Z833 Family history of diabetes mellitus: Secondary | ICD-10-CM | POA: Insufficient documentation

## 2019-11-26 DIAGNOSIS — Z8249 Family history of ischemic heart disease and other diseases of the circulatory system: Secondary | ICD-10-CM | POA: Insufficient documentation

## 2019-11-26 DIAGNOSIS — Z86718 Personal history of other venous thrombosis and embolism: Secondary | ICD-10-CM | POA: Insufficient documentation

## 2019-11-26 MED ORDER — VITRON-C 65-125 MG PO TABS: 1.0000 | ORAL_TABLET | Freq: Every day | ORAL | 0 refills | Status: DC

## 2019-11-26 NOTE — Progress Notes (Signed)
Patient verified using two identifiers for virtual visit via telephone today.  Patient does not offer any problems today.  

## 2019-11-27 NOTE — Progress Notes (Signed)
HEMATOLOGY-ONCOLOGY TeleHEALTH VISIT PROGRESS NOTE  I connected with Kimberly Nolan on 11/27/19 at  2:00 PM EST by video enabled telemedicine visit and verified that I am speaking with the correct person using two identifiers. I discussed the limitations, risks, security and privacy concerns of performing an evaluation and management service by telemedicine and the availability of in-person appointments. I also discussed with the patient that there may be a patient responsible charge related to this service. The patient expressed understanding and agreed to proceed.   Other persons participating in the visit and their role in the encounter:  None  Patient's location: Home  Provider's location: office Chief Complaint: Pulmonary embolism   INTERVAL HISTORY Kimberly Nolan is a 34 y.o. female who has above history reviewed by me today presents for follow up visit for management of pulmonary embolism Problems and complaints are listed below:  Patient has had hypercoagulable work-up done during interval and present virtually to discuss results and anticoagulation management plan.  Breathing has improved.   Patient reports feeling well.  Denies any bleeding events.  No new complaints today.   Review of Systems  Constitutional: Positive for fatigue. Negative for appetite change, chills and fever.  HENT:   Negative for hearing loss and voice change.   Eyes: Negative for eye problems.  Respiratory: Negative for chest tightness and cough.   Cardiovascular: Positive for leg swelling. Negative for chest pain.  Gastrointestinal: Negative for abdominal distention, abdominal pain and blood in stool.  Endocrine: Negative for hot flashes.  Genitourinary: Negative for difficulty urinating and frequency.   Musculoskeletal: Negative for arthralgias.  Skin: Negative for itching and rash.  Neurological: Negative for extremity weakness.  Hematological: Negative for adenopathy.  Psychiatric/Behavioral:  Negative for confusion.    Past Medical History:  Diagnosis Date  . Anginal pain ([place])   . Dyspnea   . Essential hypertension   . Family history of adverse reaction to anesthesia    Mother slow to wake up after surgery  . Graves disease   . Lower extremity edema   . Morbid obesity ([place])   . Smoker    Past Surgical History:  Procedure Laterality Date  . CESAREAN SECTION    . PARATHYROIDECTOMY  06/27/2019   Procedure: PARATHYROIDECTOMY AUTOTRANSPLANT;  Surgeon: Fredirick Maudlin, MD;  Location: [facility] ORS;  Service: General;;  . THYROIDECTOMY N/A 06/27/2019   Procedure: TOTAL THYROIDECTOMY ;  Surgeon: Fredirick Maudlin, MD;  Location: [facility] ORS;  Service: General;  Laterality: N/A;    Family History  Problem Relation Age of Onset  . Thyroid disease Mother   . Hypertension Father   . Diabetes Brother   . Hypertension Brother   . Brain cancer Maternal Aunt   . Prostate cancer Maternal Grandfather     Social History   Socioeconomic History  . Marital status: Married    Spouse name: Not on file  . Number of children: Not on file  . Years of education: Not on file  . Highest education level: Not on file  Occupational History  . Not on file  Tobacco Use  . Smoking status: Former Smoker    Packs/day: 0.50    Years: 3.00    Pack years: 1.50    Types: Cigarettes    Quit date: 10/16/2019    Years since quitting: 0.1  . Smokeless tobacco: Never Used  Substance and Sexual Activity  . Alcohol use: Not Currently    Comment: Occasionally  . Drug use: No  . Sexual  activity: Yes    Partners: Male  Other Topics Concern  . Not on file  Social History Narrative  . Not on file   Social Determinants of Health   Financial Resource Strain: Unknown  . Difficulty of Paying Living Expenses: Patient refused  Food Insecurity: Unknown  . Worried About Programme researcher, broadcasting/film/video in the Last Year: Patient refused  . Ran Out of Food in the Last Year: Patient refused  Transportation Needs:   . Sales promotion account executive (Medical): Not on file  . Lack of Transportation (Non-Medical): Not on file  Physical Activity: Unknown  . Days of Exercise per Week: Patient refused  . Minutes of Exercise per Session: Patient refused  Stress: Unknown  . Feeling of Stress : Patient refused  Social Connections: Unknown  . Frequency of Communication with Friends and Family: Patient refused  . Frequency of Social Gatherings with Friends and Family: Patient refused  . Attends Religious Services: Patient refused  . Active Member of Clubs or Organizations: Patient refused  . Attends Banker Meetings: Patient refused  . Marital Status: Patient refused  Intimate Partner Violence: Unknown  . Fear of Current or Ex-Partner: Not asked  . Emotionally Abused: Not asked  . Physically Abused: Not asked  . Sexually Abused: Not asked    Current Outpatient Medications on File Prior to Visit  Medication Sig Dispense Refill  . baclofen (LIORESAL) 10 MG tablet Take 10 mg by mouth every 8 (eight) hours as needed.    Marland Kitchen HYDROcodone-acetaminophen (NORCO/VICODIN) 5-325 MG tablet Take 1 tablet by mouth every 6 (six) hours as needed for moderate pain.    Marland Kitchen levothyroxine (SYNTHROID) 200 MCG tablet Take 200 mcg by mouth daily.    . rivaroxaban (XARELTO) 20 MG TABS tablet Take 1 tablet (20 mg total) by mouth daily with supper. 90 tablet 1  . topiramate (TOPAMAX) 25 MG tablet Take 25 mg before bed for one week. Then take 50 mg before bed for one week. Then 75 mg before bed for one week. Then 1000 mg before bed onward. 180 tablet 0   No current facility-administered medications on file prior to visit.    Allergies  Allergen Reactions  . Penicillins Itching and Rash    Did it involve swelling of the face/tongue/throat, SOB, or low BP? No Did it involve sudden or severe rash/hives, skin peeling, or any reaction on the inside of your mouth or nose? No Did you need to seek medical attention at a hospital or doctor's  office? No When did it last happen?10+ years ago If all above answers are "NO", may proceed with cephalosporin use.        Observations/Objective: Today's Vitals   11/26/19 1330  PainSc: 0-No pain   There is no height or weight on file to calculate BMI.  Physical Exam  Constitutional: No distress.  Neurological: She is alert.  Psychiatric: Mood normal.    CBC    Component Value Date/Time   WBC 5.7 11/13/2019 1411   RBC 4.46 11/13/2019 1411   HGB 11.8 (L) 11/13/2019 1411   HGB 11.9 (L) 07/02/2014 2254   HCT 39.3 11/13/2019 1411   HCT 36.9 07/02/2014 2254   PLT 267 11/13/2019 1411   PLT 165 07/02/2014 2254   MCV 88.1 11/13/2019 1411   MCV 80 07/02/2014 2254   MCH 26.5 11/13/2019 1411   MCHC 30.0 11/13/2019 1411   RDW 14.3 11/13/2019 1411   RDW 12.7 07/02/2014 2254   LYMPHSABS 1.8  11/13/2019 1411   LYMPHSABS 2.3 07/02/2014 2254   MONOABS 0.5 11/13/2019 1411   MONOABS 0.7 07/02/2014 2254   EOSABS 0.1 11/13/2019 1411   EOSABS 0.2 07/02/2014 2254   BASOSABS 0.0 11/13/2019 1411   BASOSABS 0.0 07/02/2014 2254    CMP     Component Value Date/Time   NA 139 10/19/2019 0109   NA 140 07/02/2014 2254   K 3.4 (L) 10/19/2019 0109   K 3.7 07/02/2014 2254   CL 104 10/19/2019 0109   CL 106 07/02/2014 2254   CO2 25 10/19/2019 0109   CO2 27 07/02/2014 2254   GLUCOSE 142 (H) 10/19/2019 0109   GLUCOSE 128 (H) 07/02/2014 2254   BUN 10 10/19/2019 0109   BUN 9 07/02/2014 2254   CREATININE 0.93 10/19/2019 0109   CREATININE 0.50 (L) 07/02/2014 2254   CALCIUM 8.2 (L) 10/19/2019 0109   CALCIUM 8.4 (L) 07/02/2014 2254   PROT 8.1 10/18/2019 2153   PROT 8.1 07/02/2014 2254   ALBUMIN 3.3 (L) 10/18/2019 2153   ALBUMIN 2.8 (L) 07/02/2014 2254   AST 13 (L) 10/18/2019 2153   AST 25 07/02/2014 2254   ALT 13 10/18/2019 2153   ALT 24 07/02/2014 2254   ALKPHOS 85 10/18/2019 2153   ALKPHOS 229 (H) 07/02/2014 2254   BILITOT 0.7 10/18/2019 2153   BILITOT 0.4 07/02/2014 2254    GFRNONAA >60 10/19/2019 0109   GFRNONAA >60 07/02/2014 2254   GFRAA >60 10/19/2019 0109   GFRAA >60 07/02/2014 2254     Assessment and Plan: 1. Acute pulmonary embolism, unspecified pulmonary embolism type, unspecified whether acute cor pulmonale present (HCC)   2. Acute deep vein thrombosis (DVT) of calf muscle vein of left lower extremity (HCC)   3. Normocytic anemia   4. 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  No bleeding events.  Recommend patient to continue on Xarelto 20 mg. Hypercoagulable work-up showed a negative factor V Leiden and the prothrombin gene mutation Antiphospholipid antibody panel negative except increased DRV VT which is secondary to DOACs use.  No lupus anticoagulant was detected.  I did not check protein C&S given the acute setting of thrombosis. I recommend long-term anticoagulation. We discussed about available evidence of Xarelto use in patient with morbid obesity.  Obesity can affect drug pharmacokinetics by increasing volume of the distribution and altering drug clearance as well as other pharmacodynamic effects.  Patient prefers to stay on Xarelto 20 mg daily.  Normocytic anemia, hemoglobin has improved to 11.8, iron panel shows low iron saturation indicate iron deficiency.  Recommend patient to take oral iron supplementation with Vitron C and repeat blood work at next visit.  If no improvement, may consider IV iron option.  She agrees with the plan.  Morbid obesity, lifestyle modification discussed.  Continue follow-up with primary care provider for management of other chronic medical problems.  Follow Up Instructions:  March 2021  I discussed the assessment and treatment plan with the patient. The patient was provided an opportunity to ask questions and all were answered. The patient agreed with the plan and demonstrated an understanding of the instructions.   The patient was advised to call back or seek an in-person evaluation if the symptoms worsen or if the condition fails to improve as anticipated.    Rickard Patience, MD 11/27/2019 11:50 AM

## 2019-12-10 ENCOUNTER — Encounter: Payer: Self-pay | Admitting: Physician Assistant

## 2019-12-10 NOTE — Telephone Encounter (Signed)
Have reviewed documents. She will need an office visit as it requires an extensive exam regarding ROM, functional capacity etc. Please schedule. Also looks like she might need an eye doctor's exam as well.

## 2019-12-11 NOTE — Telephone Encounter (Signed)
Patient schedule appointment for 12/13/2019 @10 :40 AM.

## 2019-12-13 ENCOUNTER — Ambulatory Visit: Payer: Medicaid Other | Admitting: Physician Assistant

## 2019-12-13 ENCOUNTER — Encounter: Payer: Self-pay | Admitting: Physician Assistant

## 2019-12-13 ENCOUNTER — Other Ambulatory Visit: Payer: Self-pay

## 2019-12-13 VITALS — BP 157/98 | HR 94 | Temp 93.5°F | Ht 67.0 in | Wt >= 6400 oz

## 2019-12-13 DIAGNOSIS — M545 Low back pain, unspecified: Secondary | ICD-10-CM

## 2019-12-13 NOTE — Progress Notes (Signed)
Patient: Kimberly Nolan Female    DOB: 11-Jun-1986   34 y.o.   MRN: [id number] Visit Date: 12/13/2019  Today's Provider: Trey Sailors, PA-C   CC: Form Completion Subjective:     HPI Patient presents today for exam for paperwork. Patient has been placed under review for her driver's license. 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 Reports she has never gotten into an accident.    She has "shared words" with her previous orthopedic provider. Seen by [facility]. [facility] and recommended to see pain management but needs referral placed by PCP.   Wt Readings from Last 3 Encounters:  12/13/19 (!) 455 lb 8 oz (206.6 kg)  10/30/19 (!) 430 lb 3.2 oz (195.1 kg)  10/24/19 (!) 440 lb (199.6 kg)     Allergies  Allergen Reactions  . Penicillins Itching and Rash    Did it involve swelling of the face/tongue/throat, SOB, or low BP? No Did it involve sudden or severe rash/hives, skin peeling, or any reaction on the inside of your mouth or nose? No Did you need to seek medical attention at a hospital or doctor's office? No When did it last happen?10+ years ago If all above answers are "NO", may proceed with cephalosporin use.      Current Outpatient Medications:  .  baclofen (LIORESAL) 10 MG tablet, Take 10 mg by mouth every 8 (eight) hours as needed., Disp: , Rfl:  .  HYDROcodone-acetaminophen (NORCO/VICODIN) 5-325 MG tablet, Take 1 tablet by mouth every 6 (six) hours as needed for moderate pain., Disp: , Rfl:  .  Iron-Vitamin C (VITRON-C) 65-125 MG TABS, Take 1 tablet by mouth daily., Disp: 90 tablet, Rfl: 0 .  levothyroxine (SYNTHROID) 200 MCG tablet, Take 200 mcg by mouth daily., Disp: , Rfl:  .  rivaroxaban (XARELTO) 20 MG TABS tablet, Take 1 tablet (20 mg total) by mouth daily with supper., Disp: 90 tablet, Rfl: 1 .  topiramate (TOPAMAX) 25 MG tablet, Take 25 mg before bed for one week. Then  take 50 mg before bed for one week. Then 75 mg before bed for one week. Then 1000 mg before bed onward., Disp: 180 tablet, Rfl: 0  Review of Systems  Constitutional: Negative.   Respiratory: Negative.   Genitourinary: Negative.   Neurological: Negative.     Social History   Tobacco Use  . Smoking status: Former Smoker    Packs/day: 0.50    Years: 3.00    Pack years: 1.50    Types: Cigarettes    Quit date: 10/16/2019    Years since quitting: 0.1  . Smokeless tobacco: Never Used  Substance Use Topics  . Alcohol use: Not Currently    Comment: Occasionally      Objective:   BP (!) 157/98 (BP Location: Right Wrist, Patient Position: Sitting, Cuff Size: Normal)   Pulse 94   Temp (!) 93.5 F (34.2 C) (Temporal)   Ht 5\' 7"  (1.702 m)   Wt (!) 455 lb 8 oz (206.6 kg)   SpO2 98%   BMI 71.34 kg/m  Vitals:   12/13/19 1050  BP: (!) 157/98  Pulse: 94  Temp: (!) 93.5 F (34.2 C)  TempSrc: Temporal  SpO2: 98%  Weight: (!) 455 lb 8 oz (206.6 kg)  Height: 5\' 7"  (1.702 m)  Body mass index is 71.34 kg/m.   Physical Exam Cardiovascular:  Rate and Rhythm: Normal rate and regular rhythm.     Heart sounds: Normal heart sounds.  Pulmonary:     Effort: Pulmonary effort is normal.     Breath sounds: Normal breath sounds.  Musculoskeletal:        General: Normal range of motion.     Comments: Full ROM in upper and lower extremities, as well as neck. She walks in independently today when previously she was in a wheelchair.   Skin:    General: Skin is warm and dry.  Neurological:     Mental Status: She is oriented to person, place, and time. Mental status is at baseline.  Psychiatric:        Mood and Affect: Mood normal.        Behavior: Behavior normal.      No results found for any visits on 12/13/19.     Assessment & Plan    1. Low back pain, unspecified back pain laterality, unspecified chronicity, unspecified whether sciatica present  Will complete forms and  fax.  - Ambulatory referral to Pain Clinic  I have spent 25 minutes with this patient, >50% of which was spent on counseling and coordination of care.       Trinna Post, PA-C  [facility]

## 2019-12-13 NOTE — Patient Instructions (Signed)

## 2019-12-19 ENCOUNTER — Encounter: Payer: Self-pay | Admitting: Physician Assistant

## 2019-12-25 ENCOUNTER — Telehealth: Payer: Self-pay

## 2019-12-25 DIAGNOSIS — M545 Low back pain, unspecified: Secondary | ICD-10-CM

## 2019-12-25 DIAGNOSIS — M5416 Radiculopathy, lumbar region: Secondary | ICD-10-CM

## 2019-12-25 NOTE — Telephone Encounter (Signed)
Since when? What does that mean? I find it hard to believe medicaid doesn't cover the surgery. Do they mean that clinic does not accept medicaid? If that is the case, please place referral for another clinic.

## 2019-12-25 NOTE — Telephone Encounter (Signed)
Patient was advised that her insurance does cover her for the surgery. Patient states that someone with [facility] pain management clinic was suppose to have reached out to you about placing a referral for the patient to come there. I advised patient that no one has contacted that office regarding this and I will send Ricki Rodriguez a message.

## 2019-12-25 NOTE — Telephone Encounter (Signed)
Copied from CRM [id number]. Topic: General - Other >> Dec 25, 2019 11:33 AM Dalphine Handing A wrote: [facility] called in regards to referral too inform that medicaid is not accepted for bariatric surgery. Please advise

## 2019-12-26 NOTE — Telephone Encounter (Signed)
Yes I am unaware of where the referral was sent. If patient can tell me the clinic I can send the referral or we can call the [facility] pain management clinic.

## 2019-12-27 NOTE — Telephone Encounter (Signed)
Spoke with patient and she states that the referral is for [facility] pain management clinic because the doctor at [facility] denied the referral. Patient states that Ricki Rodriguez can send the referral to any pain management doctor that will accept medicaid.Please advise.

## 2019-12-27 NOTE — Addendum Note (Signed)
Addended by: Trey Sailors on: 12/27/2019 09:46 AM   Modules accepted: Orders

## 2020-01-14 ENCOUNTER — Other Ambulatory Visit: Payer: Self-pay

## 2020-01-14 ENCOUNTER — Encounter: Payer: Self-pay | Admitting: Physician Assistant

## 2020-01-15 ENCOUNTER — Other Ambulatory Visit: Payer: Self-pay

## 2020-01-15 ENCOUNTER — Inpatient Hospital Stay: Payer: Medicaid Other | Attending: Oncology

## 2020-01-15 DIAGNOSIS — Z7901 Long term (current) use of anticoagulants: Secondary | ICD-10-CM | POA: Insufficient documentation

## 2020-01-15 DIAGNOSIS — Z808 Family history of malignant neoplasm of other organs or systems: Secondary | ICD-10-CM | POA: Insufficient documentation

## 2020-01-15 DIAGNOSIS — Z862 Personal history of diseases of the blood and blood-forming organs and certain disorders involving the immune mechanism: Secondary | ICD-10-CM | POA: Insufficient documentation

## 2020-01-15 DIAGNOSIS — Z8042 Family history of malignant neoplasm of prostate: Secondary | ICD-10-CM | POA: Insufficient documentation

## 2020-01-15 DIAGNOSIS — I2699 Other pulmonary embolism without acute cor pulmonale: Secondary | ICD-10-CM

## 2020-01-15 DIAGNOSIS — R0602 Shortness of breath: Secondary | ICD-10-CM | POA: Diagnosis not present

## 2020-01-15 DIAGNOSIS — I82462 Acute embolism and thrombosis of left calf muscular vein: Secondary | ICD-10-CM | POA: Insufficient documentation

## 2020-01-15 DIAGNOSIS — Z87891 Personal history of nicotine dependence: Secondary | ICD-10-CM | POA: Diagnosis not present

## 2020-01-15 DIAGNOSIS — Z86711 Personal history of pulmonary embolism: Secondary | ICD-10-CM | POA: Diagnosis present

## 2020-01-15 LAB — CBC WITH DIFFERENTIAL/PLATELET
Abs Immature Granulocytes: 0.01 10*3/uL (ref 0.00–0.07)
Basophils Absolute: 0 10*3/uL (ref 0.0–0.1)
Basophils Relative: 1 %
Eosinophils Absolute: 0.1 10*3/uL (ref 0.0–0.5)
Eosinophils Relative: 2 %
HCT: 37.3 % (ref 36.0–46.0)
Hemoglobin: 11.2 g/dL — ABNORMAL LOW (ref 12.0–15.0)
Immature Granulocytes: 0 %
Lymphocytes Relative: 28 %
Lymphs Abs: 1.6 10*3/uL (ref 0.7–4.0)
MCH: 26.2 pg (ref 26.0–34.0)
MCHC: 30 g/dL (ref 30.0–36.0)
MCV: 87.4 fL (ref 80.0–100.0)
Monocytes Absolute: 0.4 10*3/uL (ref 0.1–1.0)
Monocytes Relative: 7 %
Neutro Abs: 3.6 10*3/uL (ref 1.7–7.7)
Neutrophils Relative %: 62 %
Platelets: 296 10*3/uL (ref 150–400)
RBC: 4.27 MIL/uL (ref 3.87–5.11)
RDW: 13.5 % (ref 11.5–15.5)
WBC: 5.7 10*3/uL (ref 4.0–10.5)
nRBC: 0 % (ref 0.0–0.2)

## 2020-01-15 LAB — COMPREHENSIVE METABOLIC PANEL
ALT: 16 U/L (ref 0–44)
AST: 12 U/L — ABNORMAL LOW (ref 15–41)
Albumin: 3.5 g/dL (ref 3.5–5.0)
Alkaline Phosphatase: 84 U/L (ref 38–126)
Anion gap: 11 (ref 5–15)
BUN: 9 mg/dL (ref 6–20)
CO2: 23 mmol/L (ref 22–32)
Calcium: 8.4 mg/dL — ABNORMAL LOW (ref 8.9–10.3)
Chloride: 102 mmol/L (ref 98–111)
Creatinine, Ser: 0.73 mg/dL (ref 0.44–1.00)
GFR calc Af Amer: >60 mL/min (ref >60)
GFR calc non Af Amer: >60 mL/min (ref >60)
Glucose, Bld: 107 mg/dL — ABNORMAL HIGH (ref 70–99)
Potassium: 3.9 mmol/L (ref 3.5–5.1)
Sodium: 136 mmol/L (ref 135–145)
Total Bilirubin: 0.4 mg/dL (ref 0.3–1.2)
Total Protein: 8.7 g/dL — ABNORMAL HIGH (ref 6.5–8.1)

## 2020-01-16 ENCOUNTER — Inpatient Hospital Stay (HOSPITAL_BASED_OUTPATIENT_CLINIC_OR_DEPARTMENT_OTHER): Payer: Medicaid Other | Admitting: Oncology

## 2020-01-16 DIAGNOSIS — I2699 Other pulmonary embolism without acute cor pulmonale: Secondary | ICD-10-CM

## 2020-01-16 DIAGNOSIS — I82462 Acute embolism and thrombosis of left calf muscular vein: Secondary | ICD-10-CM | POA: Diagnosis not present

## 2020-01-16 DIAGNOSIS — D649 Anemia, unspecified: Secondary | ICD-10-CM | POA: Diagnosis not present

## 2020-01-16 DIAGNOSIS — R0602 Shortness of breath: Secondary | ICD-10-CM | POA: Diagnosis not present

## 2020-01-16 NOTE — Progress Notes (Signed)
Patient has been having mid chest burning with movement especially when going up steps for the past 1.5 week.  Also having an increase exertional SOBr.  Has noticed gum bleeding after brushing.

## 2020-01-18 ENCOUNTER — Encounter: Payer: Self-pay | Admitting: Oncology

## 2020-01-18 DIAGNOSIS — D649 Anemia, unspecified: Secondary | ICD-10-CM | POA: Insufficient documentation

## 2020-01-18 NOTE — Progress Notes (Signed)
HEMATOLOGY-ONCOLOGY TeleHEALTH VISIT PROGRESS NOTE  I connected with Kimberly Nolan on 01/18/20 at  1:30 PM EST by video enabled telemedicine visit and verified that I am speaking with the correct person using two identifiers. I discussed the limitations, risks, security and privacy concerns of performing an evaluation and management service by telemedicine and the availability of in-person appointments. I also discussed with the patient that there may be a patient responsible charge related to this service. The patient expressed understanding and agreed to proceed.   Other persons participating in the visit and their role in the encounter:  None  Patient's location: Home  Provider's location: office Chief Complaint: History of pulmonary embolism, chronic anticoagulation   INTERVAL HISTORY Kimberly Nolan is a 34 y.o. female who has above history reviewed by me today presents for follow up visit for management of PE and chronic anticoagulation  Problems and complaints are listed below:  Patient reports feeling shortness of breath with exertion, as well as chest burning sensation..  She recently moved to a house with stairs.  After climbing 1 flight of stairs, she feels shortness of breath.  Noticed some gum bleeding compression. Patient has been on Xarelto 20 mg daily.  Review of Systems  Constitutional: Negative for appetite change, chills, fatigue and fever.  HENT:   Negative for hearing loss and voice change.   Eyes: Negative for eye problems.  Respiratory: Positive for shortness of breath. Negative for chest tightness and cough.   Cardiovascular: Negative for chest pain.       Chest burning sensation  Gastrointestinal: Negative for abdominal distention, abdominal pain and blood in stool.  Endocrine: Negative for hot flashes.  Genitourinary: Negative for difficulty urinating and frequency.   Musculoskeletal: Negative for arthralgias.  Skin: Negative for itching and rash.   Neurological: Negative for extremity weakness.  Hematological: Negative for adenopathy.  Psychiatric/Behavioral: Negative for confusion.    Past Medical History:  Diagnosis Date  . Anginal pain (HCC)   . Dyspnea   . Essential hypertension   . Family history of adverse reaction to anesthesia    Mother slow to wake up after surgery  . Graves disease   . Lower extremity edema   . Morbid obesity (HCC)   . Smoker    Past Surgical History:  Procedure Laterality Date  . CESAREAN SECTION    . PARATHYROIDECTOMY  06/27/2019   Procedure: PARATHYROIDECTOMY AUTOTRANSPLANT;  Surgeon: Duanne Guess, MD;  Location: [facility] ORS;  Service: General;;  . THYROIDECTOMY N/A 06/27/2019   Procedure: TOTAL THYROIDECTOMY ;  Surgeon: Duanne Guess, MD;  Location: [facility] ORS;  Service: General;  Laterality: N/A;    Family History  Problem Relation Age of Onset  . Thyroid disease Mother   . Hypertension Father   . Diabetes Brother   . Hypertension Brother   . Brain cancer Maternal Aunt   . Prostate cancer Maternal Grandfather     Social History   Socioeconomic History  . Marital status: Married    Spouse name: Not on file  . Number of children: Not on file  . Years of education: Not on file  . Highest education level: Not on file  Occupational History  . Not on file  Tobacco Use  . Smoking status: Former Smoker    Packs/day: 0.50    Years: 3.00    Pack years: 1.50    Types: Cigarettes    Quit date: 10/16/2019    Years since quitting: 0.2  . Smokeless tobacco: Never  Used  Substance and Sexual Activity  . Alcohol use: Not Currently    Comment: Occasionally  . Drug use: No  . Sexual activity: Yes    Partners: Male  Other Topics Concern  . Not on file  Social History Narrative  . Not on file   Social Determinants of Health   Financial Resource Strain: Unknown  . Difficulty of Paying Living Expenses: Patient refused  Food Insecurity: Unknown  . Worried About Programme researcher, broadcasting/film/video in  the Last Year: Patient refused  . Ran Out of Food in the Last Year: Patient refused  Transportation Needs:   . Freight forwarder (Medical): Not on file  . Lack of Transportation (Non-Medical): Not on file  Physical Activity: Unknown  . Days of Exercise per Week: Patient refused  . Minutes of Exercise per Session: Patient refused  Stress: Unknown  . Feeling of Stress : Patient refused  Social Connections: Unknown  . Frequency of Communication with Friends and Family: Patient refused  . Frequency of Social Gatherings with Friends and Family: Patient refused  . Attends Religious Services: Patient refused  . Active Member of Clubs or Organizations: Patient refused  . Attends Banker Meetings: Patient refused  . Marital Status: Patient refused  Intimate Partner Violence: Unknown  . Fear of Current or Ex-Partner: Not asked  . Emotionally Abused: Not asked  . Physically Abused: Not asked  . Sexually Abused: Not asked    Current Outpatient Medications on File Prior to Visit  Medication Sig Dispense Refill  . baclofen (LIORESAL) 10 MG tablet Take 10 mg by mouth every 8 (eight) hours as needed.    . gabapentin (NEURONTIN) 300 MG capsule Take by mouth.    Marland Kitchen HYDROcodone-acetaminophen (NORCO/VICODIN) 5-325 MG tablet Take 1 tablet by mouth every 6 (six) hours as needed for moderate pain.    . Iron-Vitamin C (VITRON-C) 65-125 MG TABS Take 1 tablet by mouth daily. 90 tablet 0  . levothyroxine (SYNTHROID) 200 MCG tablet Take 200 mcg by mouth daily.    . rivaroxaban (XARELTO) 20 MG TABS tablet Take 1 tablet (20 mg total) by mouth daily with supper. 90 tablet 1  . topiramate (TOPAMAX) 25 MG tablet Take 25 mg before bed for one week. Then take 50 mg before bed for one week. Then 75 mg before bed for one week. Then 1000 mg before bed onward. (Patient not taking: Reported on 01/16/2020) 180 tablet 0   No current facility-administered medications on file prior to visit.    Allergies   Allergen Reactions  . Penicillins Itching and Rash    Did it involve swelling of the face/tongue/throat, SOB, or low BP? No Did it involve sudden or severe rash/hives, skin peeling, or any reaction on the inside of your mouth or nose? No Did you need to seek medical attention at a hospital or doctor's office? No When did it last happen?10+ years ago If all above answers are "NO", may proceed with cephalosporin use.        Observations/Objective: Today's Vitals   01/16/20 1232  PainSc: 0-No pain   There is no height or weight on file to calculate BMI.  Physical Exam  Constitutional: No distress.  Neurological: She is alert.    CBC    Component Value Date/Time   WBC 5.7 01/15/2020 1148   RBC 4.27 01/15/2020 1148   HGB 11.2 (L) 01/15/2020 1148   HGB 11.9 (L) 07/02/2014 2254   HCT 37.3 01/15/2020 1148  HCT 36.9 07/02/2014 2254   PLT 296 01/15/2020 1148   PLT 165 07/02/2014 2254   MCV 87.4 01/15/2020 1148   MCV 80 07/02/2014 2254   MCH 26.2 01/15/2020 1148   MCHC 30.0 01/15/2020 1148   RDW 13.5 01/15/2020 1148   RDW 12.7 07/02/2014 2254   LYMPHSABS 1.6 01/15/2020 1148   LYMPHSABS 2.3 07/02/2014 2254   MONOABS 0.4 01/15/2020 1148   MONOABS 0.7 07/02/2014 2254   EOSABS 0.1 01/15/2020 1148   EOSABS 0.2 07/02/2014 2254   BASOSABS 0.0 01/15/2020 1148   BASOSABS 0.0 07/02/2014 2254    CMP     Component Value Date/Time   NA 136 01/15/2020 1148   NA 140 07/02/2014 2254   K 3.9 01/15/2020 1148   K 3.7 07/02/2014 2254   CL 102 01/15/2020 1148   CL 106 07/02/2014 2254   CO2 23 01/15/2020 1148   CO2 27 07/02/2014 2254   GLUCOSE 107 (H) 01/15/2020 1148   GLUCOSE 128 (H) 07/02/2014 2254   BUN 9 01/15/2020 1148   BUN 9 07/02/2014 2254   CREATININE 0.73 01/15/2020 1148   CREATININE 0.50 (L) 07/02/2014 2254   CALCIUM 8.4 (L) 01/15/2020 1148   CALCIUM 8.4 (L) 07/02/2014 2254   PROT 8.7 (H) 01/15/2020 1148   PROT 8.1 07/02/2014 2254   ALBUMIN 3.5 01/15/2020 1148    ALBUMIN 2.8 (L) 07/02/2014 2254   AST 12 (L) 01/15/2020 1148   AST 25 07/02/2014 2254   ALT 16 01/15/2020 1148   ALT 24 07/02/2014 2254   ALKPHOS 84 01/15/2020 1148   ALKPHOS 229 (H) 07/02/2014 2254   BILITOT 0.4 01/15/2020 1148   BILITOT 0.4 07/02/2014 2254   GFRNONAA >60 01/15/2020 1148   GFRNONAA >60 07/02/2014 2254   GFRAA >60 01/15/2020 1148   GFRAA >60 07/02/2014 2254     Assessment and Plan: 1. Acute pulmonary embolism, unspecified pulmonary embolism type, unspecified whether acute cor pulmonale present ([place])   2. Normocytic anemia   3. Acute deep vein thrombosis (DVT) of calf muscle vein of left lower extremity (HCC)   4. Shortness of breath on exertion   5. Morbid obesity ([place])     #History of acute unprovoked PE and lower extremity DVT Patient has been on Xarelto 20 mg daily, overall tolerates well. Labs are reviewed and discussed with patient. Shortness of breath with exertion, etiology unknown.  Patient has morbid obesity and she confirms with currently weighing 455 pounds, which is a 25 pounds increase. Given her history of acute PE/DVT, multiple comorbidities, I will obtain CT chest angiogram for further evaluation.  Labs are reviewed and discussed with patient. She has hemoglobin 11.2, history of iron deficiency anemia.  I will check iron, TIBC, ferritin.  Follow Up Instructions: To be determined   I discussed the assessment and treatment plan with the patient. The patient was provided an opportunity to ask questions and all were answered. The patient agreed with the plan and demonstrated an understanding of the instructions.  The patient was advised to call back or seek an in-person evaluation if the symptoms worsen or if the condition fails to improve as anticipated.    Earlie Server, MD 01/18/2020 10:09 PM

## 2020-01-23 ENCOUNTER — Encounter: Payer: Self-pay | Admitting: Physician Assistant

## 2020-01-23 NOTE — Telephone Encounter (Signed)
Kimberly Sago do I need to place a new referral? We had placed one previously to [place] [facility]?

## 2020-01-24 ENCOUNTER — Other Ambulatory Visit: Payer: Self-pay

## 2020-01-24 ENCOUNTER — Telehealth: Payer: Self-pay

## 2020-01-24 ENCOUNTER — Inpatient Hospital Stay: Payer: Medicaid Other

## 2020-01-24 ENCOUNTER — Ambulatory Visit
Admission: RE | Admit: 2020-01-24 | Discharge: 2020-01-24 | Disposition: A | Discharge status: Home / Self Care | Payer: Medicaid Other | Source: Ambulatory Visit | Attending: Oncology | Admitting: Oncology

## 2020-01-24 DIAGNOSIS — I2699 Other pulmonary embolism without acute cor pulmonale: Secondary | ICD-10-CM | POA: Insufficient documentation

## 2020-01-24 DIAGNOSIS — D649 Anemia, unspecified: Secondary | ICD-10-CM

## 2020-01-24 DIAGNOSIS — Z86711 Personal history of pulmonary embolism: Secondary | ICD-10-CM | POA: Diagnosis not present

## 2020-01-24 LAB — FERRITIN: Ferritin: 15 ng/mL (ref 11–307)

## 2020-01-24 LAB — IRON AND TIBC
Iron: 38 ug/dL (ref 28–170)
Saturation Ratios: 11 % (ref 10.4–31.8)
TIBC: 363 ug/dL (ref 250–450)
UIBC: 325 ug/dL

## 2020-01-24 MED ORDER — IOHEXOL 350 MG/ML SOLN
75.0000 mL | Freq: Once | INTRAVENOUS | Status: AC | PRN
  Administered 2020-01-24: 75 mL via INTRAVENOUS

## 2020-01-24 NOTE — Telephone Encounter (Signed)
Correction, labs @ 945 MD @ 1015

## 2020-01-24 NOTE — Telephone Encounter (Signed)
-----   Message from Rickard Patience, MD sent at 01/24/2020  1:10 PM EST ----- No PE. Some patchy ground glass opacity which is non specific, but can be seen in early pneumonia if she continues to have SOB symptoms, she should call her PCP for additional work up. Thanks. Follow up as planned. Thanks.

## 2020-01-24 NOTE — Telephone Encounter (Signed)
Done.... Pt has been scheduled as requested  Lab/MD in 3 months. 04/25/20 lab @ 945 MD @ 10am

## 2020-01-24 NOTE — Telephone Encounter (Signed)
-----   Message from Rickard Patience, MD sent at 01/24/2020  1:12 PM EST ----- Sorry, forget to mention her follow up plan. She can follow up in 3 months, please order cbc cmp. Thanks. In person follow up

## 2020-01-24 NOTE — Telephone Encounter (Signed)
Left message to call for results

## 2020-01-28 NOTE — Telephone Encounter (Signed)
Patient informed. 

## 2020-02-14 ENCOUNTER — Ambulatory Visit: Payer: Medicaid Other | Attending: Internal Medicine

## 2020-02-14 ENCOUNTER — Other Ambulatory Visit: Payer: Self-pay

## 2020-02-14 DIAGNOSIS — Z23 Encounter for immunization: Secondary | ICD-10-CM

## 2020-02-14 NOTE — Progress Notes (Signed)
   Covid-19 Vaccination Clinic  Name:  Kimberly Nolan    MRN: [id number] DOB: 26-Nov-1985  02/14/2020  Ms. [name] was observed post Covid-19 immunization for 15 minutes without incident. She was provided with Vaccine Information Sheet and instruction to access the V-Safe system.   Ms. [name] was instructed to call 911 with any severe reactions post vaccine: Marland Kitchen Difficulty breathing  . Swelling of face and throat  . A fast heartbeat  . A bad rash all over body  . Dizziness and weakness   Immunizations Administered    Name Date Dose VIS Date Route   Pfizer COVID-19 Vaccine 02/14/2020 10:58 AM 0.3 mL 10/26/2019 Intramuscular   Manufacturer: [employer], [employer]   Lot: [id number]   NDC: [address]-8

## 2020-03-11 ENCOUNTER — Ambulatory Visit: Payer: Medicaid Other | Attending: Internal Medicine

## 2020-03-11 DIAGNOSIS — Z23 Encounter for immunization: Secondary | ICD-10-CM

## 2020-03-11 NOTE — Progress Notes (Signed)
   Covid-19 Vaccination Clinic  Name:  Kimberly Nolan    MRN: [id number] DOB: [birth year]  03/11/2020  [name] was observed post Covid-19 immunization for 15 minutes without incident. She was provided with Vaccine Information Sheet and instruction to access the V-Safe system.   [name] was instructed to call 911 with any severe reactions post vaccine: Marland Kitchen Difficulty breathing  . Swelling of face and throat  . A fast heartbeat  . A bad rash all over body  . Dizziness and weakness   Immunizations Administered    Name Date Dose VIS Date Route   Pfizer COVID-19 Vaccine 03/11/2020  4:52 PM 0.3 mL 01/09/2019 Intramuscular   Manufacturer: [employer], [employer]   Lot: [id number]   NDC: [address]-9

## 2020-04-25 ENCOUNTER — Encounter: Payer: Self-pay | Admitting: Oncology

## 2020-04-25 ENCOUNTER — Other Ambulatory Visit: Payer: Self-pay

## 2020-04-25 ENCOUNTER — Inpatient Hospital Stay (HOSPITAL_BASED_OUTPATIENT_CLINIC_OR_DEPARTMENT_OTHER): Payer: Medicaid Other | Admitting: Oncology

## 2020-04-25 ENCOUNTER — Inpatient Hospital Stay: Payer: Medicaid Other | Attending: Oncology

## 2020-04-25 VITALS — BP 158/85 | HR 89 | Temp 97.2°F | Wt >= 6400 oz

## 2020-04-25 DIAGNOSIS — Z87891 Personal history of nicotine dependence: Secondary | ICD-10-CM | POA: Diagnosis not present

## 2020-04-25 DIAGNOSIS — D649 Anemia, unspecified: Secondary | ICD-10-CM

## 2020-04-25 DIAGNOSIS — Z86711 Personal history of pulmonary embolism: Secondary | ICD-10-CM

## 2020-04-25 DIAGNOSIS — Z86718 Personal history of other venous thrombosis and embolism: Secondary | ICD-10-CM | POA: Insufficient documentation

## 2020-04-25 DIAGNOSIS — Z808 Family history of malignant neoplasm of other organs or systems: Secondary | ICD-10-CM | POA: Insufficient documentation

## 2020-04-25 DIAGNOSIS — N924 Excessive bleeding in the premenopausal period: Secondary | ICD-10-CM

## 2020-04-25 DIAGNOSIS — Z8042 Family history of malignant neoplasm of prostate: Secondary | ICD-10-CM | POA: Insufficient documentation

## 2020-04-25 DIAGNOSIS — I2699 Other pulmonary embolism without acute cor pulmonale: Secondary | ICD-10-CM

## 2020-04-25 DIAGNOSIS — Z7901 Long term (current) use of anticoagulants: Secondary | ICD-10-CM | POA: Insufficient documentation

## 2020-04-25 LAB — COMPREHENSIVE METABOLIC PANEL
ALT: 17 U/L (ref 0–44)
AST: 13 U/L — ABNORMAL LOW (ref 15–41)
Albumin: 3.1 g/dL — ABNORMAL LOW (ref 3.5–5.0)
Alkaline Phosphatase: 88 U/L (ref 38–126)
Anion gap: 10 (ref 5–15)
BUN: 14 mg/dL (ref 6–20)
CO2: 25 mmol/L (ref 22–32)
Calcium: 8.7 mg/dL — ABNORMAL LOW (ref 8.9–10.3)
Chloride: 103 mmol/L (ref 98–111)
Creatinine, Ser: 0.96 mg/dL (ref 0.44–1.00)
GFR calc Af Amer: >60 mL/min (ref >60)
GFR calc non Af Amer: >60 mL/min (ref >60)
Glucose, Bld: 188 mg/dL — ABNORMAL HIGH (ref 70–99)
Potassium: 4 mmol/L (ref 3.5–5.1)
Sodium: 138 mmol/L (ref 135–145)
Total Bilirubin: 0.5 mg/dL (ref 0.3–1.2)
Total Protein: 7.9 g/dL (ref 6.5–8.1)

## 2020-04-25 LAB — CBC WITH DIFFERENTIAL/PLATELET
Abs Immature Granulocytes: 0.02 10*3/uL (ref 0.00–0.07)
Basophils Absolute: 0 10*3/uL (ref 0.0–0.1)
Basophils Relative: 0 %
Eosinophils Absolute: 0.1 10*3/uL (ref 0.0–0.5)
Eosinophils Relative: 3 %
HCT: 35.3 % — ABNORMAL LOW (ref 36.0–46.0)
Hemoglobin: 10.8 g/dL — ABNORMAL LOW (ref 12.0–15.0)
Immature Granulocytes: 0 %
Lymphocytes Relative: 27 %
Lymphs Abs: 1.4 10*3/uL (ref 0.7–4.0)
MCH: 25.1 pg — ABNORMAL LOW (ref 26.0–34.0)
MCHC: 30.6 g/dL (ref 30.0–36.0)
MCV: 82.1 fL (ref 80.0–100.0)
Monocytes Absolute: 0.3 10*3/uL (ref 0.1–1.0)
Monocytes Relative: 6 %
Neutro Abs: 3.3 10*3/uL (ref 1.7–7.7)
Neutrophils Relative %: 64 %
Platelets: 298 10*3/uL (ref 150–400)
RBC: 4.3 MIL/uL (ref 3.87–5.11)
RDW: 14.6 % (ref 11.5–15.5)
WBC: 5.2 10*3/uL (ref 4.0–10.5)
nRBC: 0 % (ref 0.0–0.2)

## 2020-04-25 LAB — IRON AND TIBC
Iron: 20 ug/dL — ABNORMAL LOW (ref 28–170)
Saturation Ratios: 6 % — ABNORMAL LOW (ref 10.4–31.8)
TIBC: 357 ug/dL (ref 250–450)
UIBC: 337 ug/dL

## 2020-04-25 LAB — FERRITIN: Ferritin: 17 ng/mL (ref 11–307)

## 2020-04-25 MED ORDER — APIXABAN 2.5 MG PO TABS
2.5000 mg | ORAL_TABLET | Freq: Two times a day (BID) | ORAL | 3 refills | Status: DC
Start: 2020-04-25 — End: 2021-03-07

## 2020-04-25 MED ORDER — VITRON-C 65-125 MG PO TABS: 1.0000 | ORAL_TABLET | Freq: Every day | ORAL | 1 refills | Status: DC

## 2020-04-25 NOTE — Progress Notes (Signed)
Patient has chronic low back pain with bilateral hip pain that is followed by ortho and pain clinic.

## 2020-04-25 NOTE — Progress Notes (Signed)
Hematology/Oncology Consult note [facility] [phone] [phone]   Patient Care Team: Maryella Shivers as PCP - General (Physician Assistant) Alleen Borne, MD as Attending Physician (Cardiothoracic Surgery) Jolaine Click, MD (Inactive) as Attending Physician (Radiology)  REFERRING PROVIDER: Trey Sailors, PA-C  CHIEF COMPLAINTS/REASON FOR VISIT:   DVT/PE  HISTORY OF PRESENTING ILLNESS:   Kimberly Nolan is a  34 y.o.  female with PMH listed below was seen in consultation at the request of  Jodi Marble, Adriana M, PA-C  for evaluation of DVT/PE. Patient presented to ED on 10/18/2019 due increased cramping pain of left lower extremity and left before ultrasound result did.  Patient was notified about DVT results which prompted her to return to ER for evaluation of leg cramping and progressively worsening shortness of breath.  Patient endorses chronic bilateral lower extremity edema, shortness of breath with exertion. Lower extremity venous Doppler ultrasound showed DVT within the left popliteal vein. CT angiogram of the chest showed bilateral pulmonary emboli in all lobes of both lungs and the findings suggestive of RV strain.  Patient was admitted and anticoagulation with heparin was given.  Patient was discharged on Xarelto starting.  Patient currently is on Xarelto 15 mg twice daily. There was no immobilization factors which may contribute to her events. Patient has morbid obesity and has sedentary lifestyle. Patient recently stopped smoking. Today she reports that left lower extremity cramp has improved.  Continues to have bilateral lower extremity edema which is chronic for her.  Shortness of breath has improved.  Denies any bleeding events.  INTERVAL HISTORY Kimberly Nolan is a 34 y.o. female who has above history reviewed by me today presents for follow up visit for management of history of DVT/PE.  Problems and complaints are  listed below: Patient takes Xarelto 20mg  daily, tolerates well.  Denies hematochezia, hematuria, hematemesis, epistaxis, black tarry stool or easy bruising.  She is not taking oral iron tablets as she ran out of it. Chronic lower extremities swelling unchanged.  Denies any shortness of breath, chest pain.  menstrual period is heavy.   Review of Systems  Constitutional: Positive for fatigue. Negative for appetite change, chills and fever.  HENT:   Negative for hearing loss and voice change.   Eyes: Negative for eye problems.  Respiratory: Negative for chest tightness and cough.   Cardiovascular: Positive for leg swelling. Negative for chest pain.  Gastrointestinal: Negative for abdominal distention, abdominal pain and blood in stool.  Endocrine: Negative for hot flashes.  Genitourinary: Negative for difficulty urinating and frequency.   Musculoskeletal: Negative for arthralgias.  Skin: Negative for itching and rash.  Neurological: Negative for extremity weakness.  Hematological: Negative for adenopathy.  Psychiatric/Behavioral: Negative for confusion.    MEDICAL HISTORY:  Past Medical History:  Diagnosis Date  . Anginal pain (HCC)   . Dyspnea   . Essential hypertension   . Family history of adverse reaction to anesthesia    Mother slow to wake up after surgery  . Graves disease   . Lower extremity edema   . Morbid obesity (HCC)   . Smoker     SURGICAL HISTORY: Past Surgical History:  Procedure Laterality Date  . CESAREAN SECTION    . PARATHYROIDECTOMY  06/27/2019   Procedure: PARATHYROIDECTOMY AUTOTRANSPLANT;  Surgeon: 08/27/2019, MD;  Location: [facility] ORS;  Service: General;;  . THYROIDECTOMY N/A 06/27/2019   Procedure: TOTAL THYROIDECTOMY ;  Surgeon: 08/27/2019, MD;  Location: [facility] ORS;  Service:  General;  Laterality: N/A;    SOCIAL HISTORY: Social History   Socioeconomic History  . Marital status: Married    Spouse name: Not on file  . Number of  children: Not on file  . Years of education: Not on file  . Highest education level: Not on file  Occupational History  . Not on file  Tobacco Use  . Smoking status: Former Smoker    Packs/day: 0.50    Years: 3.00    Pack years: 1.50    Types: Cigarettes    Quit date: 10/16/2019    Years since quitting: 0.5  . Smokeless tobacco: Never Used  Vaping Use  . Vaping Use: Never used  Substance and Sexual Activity  . Alcohol use: Not Currently    Comment: Occasionally  . Drug use: No  . Sexual activity: Yes    Partners: Male  Other Topics Concern  . Not on file  Social History Narrative  . Not on file   Social Determinants of Health   Financial Resource Strain: Unknown  . Difficulty of Paying Living Expenses: Patient refused  Food Insecurity: Unknown  . Worried About Charity fundraiser in the Last Year: Patient refused  . Ran Out of Food in the Last Year: Patient refused  Transportation Needs:   . Film/video editor (Medical):   Marland Kitchen Lack of Transportation (Non-Medical):   Physical Activity: Unknown  . Days of Exercise per Week: Patient refused  . Minutes of Exercise per Session: Patient refused  Stress: Unknown  . Feeling of Stress : Patient refused  Social Connections: Unknown  . Frequency of Communication with Friends and Family: Patient refused  . Frequency of Social Gatherings with Friends and Family: Patient refused  . Attends Religious Services: Patient refused  . Active Member of Clubs or Organizations: Patient refused  . Attends Archivist Meetings: Patient refused  . Marital Status: Patient refused  Intimate Partner Violence: Unknown  . Fear of Current or Ex-Partner: Not asked  . Emotionally Abused: Not asked  . Physically Abused: Not asked  . Sexually Abused: Not asked    FAMILY HISTORY: Family History  Problem Relation Age of Onset  . Thyroid disease Mother   . Hypertension Father   . Diabetes Brother   . Hypertension Brother   . Brain  cancer Maternal Aunt   . Prostate cancer Maternal Grandfather     ALLERGIES:  is allergic to penicillins.  MEDICATIONS:  Current Outpatient Medications  Medication Sig Dispense Refill  . baclofen (LIORESAL) 10 MG tablet Take 10 mg by mouth every 8 (eight) hours as needed.    Marland Kitchen HYDROcodone-acetaminophen (NORCO/VICODIN) 5-325 MG tablet Take 1 tablet by mouth every 6 (six) hours as needed for moderate pain.    . Iron-Vitamin C (VITRON-C) 65-125 MG TABS Take 1 tablet by mouth daily. 90 tablet 0  . levothyroxine (SYNTHROID) 200 MCG tablet Take 200 mcg by mouth daily.    . rivaroxaban (XARELTO) 20 MG TABS tablet Take 1 tablet (20 mg total) by mouth daily with supper. 90 tablet 1  . topiramate (TOPAMAX) 25 MG tablet Take 25 mg before bed for one week. Then take 50 mg before bed for one week. Then 75 mg before bed for one week. Then 1000 mg before bed onward. (Patient not taking: Reported on 01/16/2020) 180 tablet 0   No current facility-administered medications for this visit.     PHYSICAL EXAMINATION: ECOG PERFORMANCE STATUS: 1 - Symptomatic but completely ambulatory Vitals:  04/25/20 1031  BP: (!) 158/85  Pulse: 89  Temp: (!) 97.2 F (36.2 C)  SpO2: 98%   Filed Weights   04/25/20 1031  Weight: (!) 447 lb 3.2 oz (202.8 kg)    Physical Exam Constitutional:      General: She is not in acute distress.    Appearance: She is obese.  HENT:     Head: Normocephalic and atraumatic.  Eyes:     General: No scleral icterus.    Pupils: Pupils are equal, round, and reactive to light.  Cardiovascular:     Rate and Rhythm: Normal rate and regular rhythm.     Heart sounds: Normal heart sounds.  Pulmonary:     Effort: Pulmonary effort is normal. No respiratory distress.     Breath sounds: No wheezing.     Comments: Decreased breath sound bilaterally Abdominal:     General: Bowel sounds are normal. There is no distension.     Palpations: Abdomen is soft. There is no mass.     Tenderness:  There is no abdominal tenderness.  Musculoskeletal:        General: Swelling present. No deformity. Normal range of motion.     Cervical back: Normal range of motion and neck supple.     Comments: Chronic bilateral lower extremity edema  Skin:    General: Skin is warm and dry.     Findings: No erythema or rash.  Neurological:     Mental Status: She is alert and oriented to person, place, and time.     Cranial Nerves: No cranial nerve deficit.     Coordination: Coordination normal.  Psychiatric:        Behavior: Behavior normal.        Thought Content: Thought content normal.     LABORATORY DATA:  I have reviewed the data as listed Lab Results  Component Value Date   WBC 5.2 04/25/2020   HGB 10.8 (L) 04/25/2020   HCT 35.3 (L) 04/25/2020   MCV 82.1 04/25/2020   PLT 298 04/25/2020   Recent Labs    06/04/19 1455 06/28/19 0443 10/18/19 2153 10/19/19 0109 01/15/20 1148  NA   < >  --  136 139 136  K   < >  --  3.5 3.4* 3.9  CL   < >  --  103 104 102  CO2   < >  --  24 25 23   GLUCOSE   < >  --  129* 142* 107*  BUN   < >  --  11 10 9   CREATININE   < >  --  0.82 0.93 0.73  CALCIUM   < > 8.4* 8.3* 8.2* 8.4*  GFRNONAA   < >  --  >60 >60 >60  GFRAA   < >  --  >60 >60 >60  PROT  --   --  8.1  --  8.7*  ALBUMIN  --  2.8* 3.3*  --  3.5  AST  --   --  13*  --  12*  ALT  --   --  13  --  16  ALKPHOS  --   --  85  --  84  BILITOT  --   --  0.7  --  0.4   < > = values in this interval not displayed.   Iron/TIBC/Ferritin/ %Sat    Component Value Date/Time   IRON 38 01/24/2020 0931   TIBC 363 01/24/2020 0931   FERRITIN 15 01/24/2020  2130   IRONPCTSAT 11 01/24/2020 0931      RADIOGRAPHIC STUDIES: I have personally reviewed the radiological images as listed and agreed with the findings in the report. No results found.   ASSESSMENT & PLAN:  1. History of pulmonary embolism   2. Normocytic anemia   3. History of DVT (deep vein thrombosis)   4. Excessive bleeding in  premenopausal period    # History of PE and DVT She has been on Xarelto 20mg  daily for about 6 months.  Recommend low dose DOAC for anticoagulation prophylaxis.  Recommend switch to Eliquis 2.5mg  BID.   Excessive bleeding, possible due to being on anticoagulation.  Hopefully her symptoms will improve after reduced to low dose DOAC.  I also discussed with her about seeing GYN if her symptom does not improves.   # Iron deficiency anemia Iron panel showed iron saturation 6, ferritin 17. Discussed about option of oral iron supplementation versus IV iron infusions.  Patient prefers to continue oral iron supplementation. Vitron C 1 tablet daily, prescription sent to pharmacy, 24-month supply.  Orders Placed This Encounter  Procedures  . Ferritin    Standing Status:   Future    Number of Occurrences:   1    Standing Expiration Date:   04/25/2021  . Iron and TIBC    Standing Status:   Future    Number of Occurrences:   1    Standing Expiration Date:   04/25/2021  . CBC with Differential/Platelet    Standing Status:   Future    Standing Expiration Date:   04/25/2021  . Comprehensive metabolic panel    Standing Status:   Future    Standing Expiration Date:   04/25/2021  . Ferritin    Standing Status:   Future    Standing Expiration Date:   04/25/2021  . Iron and TIBC    Standing Status:   Future    Standing Expiration Date:   04/25/2021    All questions were answered. The patient knows to call the clinic with any problems questions or concerns.  cc 06/25/2021, PA-C    Return of visit: Follow-up in 4 months. Thank you for this kind referral and the opportunity to participate in the care of this patient. A copy of today's note is routed to referring provider  Trey Sailors, MD, PhD Hematology Oncology [facility] Pager- [facility] 04/25/2020

## 2020-08-19 ENCOUNTER — Telehealth: Payer: Self-pay

## 2020-08-19 NOTE — Telephone Encounter (Signed)
Please review. Thanks!  

## 2020-08-19 NOTE — Addendum Note (Signed)
Addended by: Trey Sailors on: 08/19/2020 04:29 PM   Modules accepted: Orders

## 2020-08-19 NOTE — Telephone Encounter (Signed)
Copied from CRM [id number]. Topic: Referral - Request for Referral >> Aug 19, 2020 11:07 AM Dalphine Handing A wrote: Patient would like a a referral placed to [facility] so that she may book an appointment. Patient stated that she has already spoken to [facility] in regards to weight issues. fax is [phone]

## 2020-08-19 NOTE — Telephone Encounter (Signed)
Referral placed.

## 2020-08-20 ENCOUNTER — Other Ambulatory Visit: Payer: Self-pay

## 2020-08-20 ENCOUNTER — Inpatient Hospital Stay: Payer: Medicaid Other | Attending: Oncology

## 2020-08-20 ENCOUNTER — Encounter: Payer: Self-pay | Admitting: Oncology

## 2020-08-20 DIAGNOSIS — Z86711 Personal history of pulmonary embolism: Secondary | ICD-10-CM | POA: Diagnosis present

## 2020-08-20 DIAGNOSIS — D649 Anemia, unspecified: Secondary | ICD-10-CM | POA: Insufficient documentation

## 2020-08-20 DIAGNOSIS — Z8042 Family history of malignant neoplasm of prostate: Secondary | ICD-10-CM | POA: Diagnosis not present

## 2020-08-20 DIAGNOSIS — Z808 Family history of malignant neoplasm of other organs or systems: Secondary | ICD-10-CM | POA: Diagnosis not present

## 2020-08-20 DIAGNOSIS — Z86718 Personal history of other venous thrombosis and embolism: Secondary | ICD-10-CM | POA: Diagnosis not present

## 2020-08-20 DIAGNOSIS — Z7901 Long term (current) use of anticoagulants: Secondary | ICD-10-CM | POA: Diagnosis not present

## 2020-08-20 DIAGNOSIS — Z87891 Personal history of nicotine dependence: Secondary | ICD-10-CM | POA: Diagnosis not present

## 2020-08-20 LAB — CBC WITH DIFFERENTIAL/PLATELET
Abs Immature Granulocytes: 0.01 10*3/uL (ref 0.00–0.07)
Basophils Absolute: 0 10*3/uL (ref 0.0–0.1)
Basophils Relative: 1 %
Eosinophils Absolute: 0.1 10*3/uL (ref 0.0–0.5)
Eosinophils Relative: 2 %
HCT: 35.9 % — ABNORMAL LOW (ref 36.0–46.0)
Hemoglobin: 11.1 g/dL — ABNORMAL LOW (ref 12.0–15.0)
Immature Granulocytes: 0 %
Lymphocytes Relative: 26 %
Lymphs Abs: 1.6 10*3/uL (ref 0.7–4.0)
MCH: 25 pg — ABNORMAL LOW (ref 26.0–34.0)
MCHC: 30.9 g/dL (ref 30.0–36.0)
MCV: 80.9 fL (ref 80.0–100.0)
Monocytes Absolute: 0.4 10*3/uL (ref 0.1–1.0)
Monocytes Relative: 6 %
Neutro Abs: 3.9 10*3/uL (ref 1.7–7.7)
Neutrophils Relative %: 65 %
Platelets: 362 10*3/uL (ref 150–400)
RBC: 4.44 MIL/uL (ref 3.87–5.11)
RDW: 15.9 % — ABNORMAL HIGH (ref 11.5–15.5)
WBC: 6 10*3/uL (ref 4.0–10.5)
nRBC: 0 % (ref 0.0–0.2)

## 2020-08-20 LAB — COMPREHENSIVE METABOLIC PANEL
ALT: 15 U/L (ref 0–44)
AST: 12 U/L — ABNORMAL LOW (ref 15–41)
Albumin: 3.3 g/dL — ABNORMAL LOW (ref 3.5–5.0)
Alkaline Phosphatase: 73 U/L (ref 38–126)
Anion gap: 8 (ref 5–15)
BUN: 12 mg/dL (ref 6–20)
CO2: 28 mmol/L (ref 22–32)
Calcium: 8.1 mg/dL — ABNORMAL LOW (ref 8.9–10.3)
Chloride: 101 mmol/L (ref 98–111)
Creatinine, Ser: 0.85 mg/dL (ref 0.44–1.00)
GFR calc non Af Amer: >60 mL/min (ref >60)
Glucose, Bld: 116 mg/dL — ABNORMAL HIGH (ref 70–99)
Potassium: 3.6 mmol/L (ref 3.5–5.1)
Sodium: 137 mmol/L (ref 135–145)
Total Bilirubin: 0.5 mg/dL (ref 0.3–1.2)
Total Protein: 8.1 g/dL (ref 6.5–8.1)

## 2020-08-20 LAB — IRON AND TIBC
Iron: 22 ug/dL — ABNORMAL LOW (ref 28–170)
Saturation Ratios: 6 % — ABNORMAL LOW (ref 10.4–31.8)
TIBC: 370 ug/dL (ref 250–450)
UIBC: 348 ug/dL

## 2020-08-20 LAB — FERRITIN: Ferritin: 19 ng/mL (ref 11–307)

## 2020-08-22 ENCOUNTER — Encounter: Payer: Self-pay | Admitting: Oncology

## 2020-08-22 ENCOUNTER — Inpatient Hospital Stay (HOSPITAL_BASED_OUTPATIENT_CLINIC_OR_DEPARTMENT_OTHER): Payer: Medicaid Other | Admitting: Oncology

## 2020-08-22 ENCOUNTER — Other Ambulatory Visit: Payer: Self-pay

## 2020-08-22 ENCOUNTER — Inpatient Hospital Stay: Payer: Medicaid Other | Admitting: Oncology

## 2020-08-22 DIAGNOSIS — D649 Anemia, unspecified: Secondary | ICD-10-CM

## 2020-08-22 DIAGNOSIS — Z86711 Personal history of pulmonary embolism: Secondary | ICD-10-CM

## 2020-08-22 DIAGNOSIS — Z86718 Personal history of other venous thrombosis and embolism: Secondary | ICD-10-CM | POA: Diagnosis not present

## 2020-08-22 NOTE — Progress Notes (Signed)
Patient denies new problems/concerns today.   °

## 2020-08-22 NOTE — Progress Notes (Signed)
HEMATOLOGY-ONCOLOGY TeleHEALTH VISIT PROGRESS NOTE  I connected with Kimberly Nolan on 08/22/20 at  2:30 PM EDT by video enabled telemedicine visit and verified that I am speaking with the correct person using two identifiers. I discussed the limitations, risks, security and privacy concerns of performing an evaluation and management service by telemedicine and the availability of in-person appointments. I also discussed with the patient that there may be a patient responsible charge related to this service. The patient expressed understanding and agreed to proceed.   Other persons participating in the visit and their role in the encounter:  None  Patient's location: Home  Provider's location: office Chief Complaint: History of pulmonary embolism, chronic anticoagulation   INTERVAL HISTORY Kimberly Nolan is a 34 y.o. female who has above history reviewed by me today presents for follow up visit for management of PE and chronic anticoagulation, iron deficiency Problems and complaints are listed below:  Today patient reports no new complaints.  Chronic fatigue, same.  No shortness of breath. Patient has been on Eliquis 2.5 mg twice daily for anticoagulation prophylaxis.  She tolerates well.  No bleeding.  Menorrhagia has improved.  She takes oral iron supplementation.   Review of Systems  Constitutional: Negative for appetite change, chills, fatigue and fever.  HENT:   Negative for hearing loss and voice change.   Eyes: Negative for eye problems.  Respiratory: Negative for chest tightness, cough and shortness of breath.   Cardiovascular: Negative for chest pain.  Gastrointestinal: Negative for abdominal distention, abdominal pain and blood in stool.  Endocrine: Negative for hot flashes.  Genitourinary: Negative for difficulty urinating and frequency.   Musculoskeletal: Negative for arthralgias.  Skin: Negative for itching and rash.  Neurological: Negative for extremity weakness.   Hematological: Negative for adenopathy.  Psychiatric/Behavioral: Negative for confusion.    Past Medical History:  Diagnosis Date  . Anginal pain (HCC)   . Dyspnea   . Essential hypertension   . Family history of adverse reaction to anesthesia    Mother slow to wake up after surgery  . Graves disease   . Lower extremity edema   . Morbid obesity (HCC)   . Smoker    Past Surgical History:  Procedure Laterality Date  . CESAREAN SECTION    . PARATHYROIDECTOMY  06/27/2019   Procedure: PARATHYROIDECTOMY AUTOTRANSPLANT;  Surgeon: Duanne Guess, MD;  Location: [facility] ORS;  Service: General;;  . THYROIDECTOMY N/A 06/27/2019   Procedure: TOTAL THYROIDECTOMY ;  Surgeon: Duanne Guess, MD;  Location: [facility] ORS;  Service: General;  Laterality: N/A;    Family History  Problem Relation Age of Onset  . Thyroid disease Mother   . Hypertension Father   . Diabetes Brother   . Hypertension Brother   . Brain cancer Maternal Aunt   . Prostate cancer Maternal Grandfather     Social History   Socioeconomic History  . Marital status: Married    Spouse name: Not on file  . Number of children: Not on file  . Years of education: Not on file  . Highest education level: Not on file  Occupational History  . Not on file  Tobacco Use  . Smoking status: Former Smoker    Packs/day: 0.50    Years: 3.00    Pack years: 1.50    Types: Cigarettes    Quit date: 10/16/2019    Years since quitting: 0.8  . Smokeless tobacco: Never Used  Vaping Use  . Vaping Use: Never used  Substance and Sexual  Activity  . Alcohol use: Not Currently    Comment: Occasionally  . Drug use: No  . Sexual activity: Yes    Partners: Male  Other Topics Concern  . Not on file  Social History Narrative  . Not on file   Social Determinants of Health   Financial Resource Strain:   . Difficulty of Paying Living Expenses: Not on file  Food Insecurity:   . Worried About Programme researcher, broadcasting/film/video in the Last Year: Not on file   . Ran Out of Food in the Last Year: Not on file  Transportation Needs:   . Lack of Transportation (Medical): Not on file  . Lack of Transportation (Non-Medical): Not on file  Physical Activity:   . Days of Exercise per Week: Not on file  . Minutes of Exercise per Session: Not on file  Stress:   . Feeling of Stress : Not on file  Social Connections:   . Frequency of Communication with Friends and Family: Not on file  . Frequency of Social Gatherings with Friends and Family: Not on file  . Attends Religious Services: Not on file  . Active Member of Clubs or Organizations: Not on file  . Attends Banker Meetings: Not on file  . Marital Status: Not on file  Intimate Partner Violence:   . Fear of Current or Ex-Partner: Not on file  . Emotionally Abused: Not on file  . Physically Abused: Not on file  . Sexually Abused: Not on file    Current Outpatient Medications on File Prior to Visit  Medication Sig Dispense Refill  . apixaban (ELIQUIS) 2.5 MG TABS tablet Take 1 tablet (2.5 mg total) by mouth 2 (two) times daily. 60 tablet 3  . baclofen (LIORESAL) 10 MG tablet Take 10 mg by mouth every 8 (eight) hours as needed.    . gabapentin (NEURONTIN) 300 MG capsule gabapentin 300 mg capsule  PLEASE SEE ATTACHED FOR DETAILED DIRECTIONS    . Iron-Vitamin C (VITRON-C) 65-125 MG TABS Take 1 tablet by mouth daily. 90 tablet 1  . levothyroxine (SYNTHROID) 200 MCG tablet Take 200 mcg by mouth daily.    Marland Kitchen HYDROcodone-acetaminophen (NORCO/VICODIN) 5-325 MG tablet Take 1 tablet by mouth every 6 (six) hours as needed for moderate pain. (Patient not taking: Reported on 04/25/2020)    . topiramate (TOPAMAX) 25 MG tablet Take 25 mg before bed for one week. Then take 50 mg before bed for one week. Then 75 mg before bed for one week. Then 1000 mg before bed onward. (Patient not taking: Reported on 01/16/2020) 180 tablet 0   No current facility-administered medications on file prior to visit.     Allergies  Allergen Reactions  . Penicillins Itching and Rash    Did it involve swelling of the face/tongue/throat, SOB, or low BP? No Did it involve sudden or severe rash/hives, skin peeling, or any reaction on the inside of your mouth or nose? No Did you need to seek medical attention at a hospital or doctor's office? No When did it last happen?10+ years ago If all above answers are "NO", may proceed with cephalosporin use.        Observations/Objective: Today's Vitals   08/22/20 0945  PainSc: 0-No pain   There is no height or weight on file to calculate BMI.  Physical Exam Constitutional:      General: She is not in acute distress. Neurological:     Mental Status: She is alert.  CBC    Component Value Date/Time   WBC 6.0 08/20/2020 1349   RBC 4.44 08/20/2020 1349   HGB 11.1 (L) 08/20/2020 1349   HGB 11.9 (L) 07/02/2014 2254   HCT 35.9 (L) 08/20/2020 1349   HCT 36.9 07/02/2014 2254   PLT 362 08/20/2020 1349   PLT 165 07/02/2014 2254   MCV 80.9 08/20/2020 1349   MCV 80 07/02/2014 2254   MCH 25.0 (L) 08/20/2020 1349   MCHC 30.9 08/20/2020 1349   RDW 15.9 (H) 08/20/2020 1349   RDW 12.7 07/02/2014 2254   LYMPHSABS 1.6 08/20/2020 1349   LYMPHSABS 2.3 07/02/2014 2254   MONOABS 0.4 08/20/2020 1349   MONOABS 0.7 07/02/2014 2254   EOSABS 0.1 08/20/2020 1349   EOSABS 0.2 07/02/2014 2254   BASOSABS 0.0 08/20/2020 1349   BASOSABS 0.0 07/02/2014 2254    CMP     Component Value Date/Time   NA 137 08/20/2020 1349   NA 140 07/02/2014 2254   K 3.6 08/20/2020 1349   K 3.7 07/02/2014 2254   CL 101 08/20/2020 1349   CL 106 07/02/2014 2254   CO2 28 08/20/2020 1349   CO2 27 07/02/2014 2254   GLUCOSE 116 (H) 08/20/2020 1349   GLUCOSE 128 (H) 07/02/2014 2254   BUN 12 08/20/2020 1349   BUN 9 07/02/2014 2254   CREATININE 0.85 08/20/2020 1349   CREATININE 0.50 (L) 07/02/2014 2254   CALCIUM 8.1 (L) 08/20/2020 1349   CALCIUM 8.4 (L) 07/02/2014 2254   PROT 8.1  08/20/2020 1349   PROT 8.1 07/02/2014 2254   ALBUMIN 3.3 (L) 08/20/2020 1349   ALBUMIN 2.8 (L) 07/02/2014 2254   AST 12 (L) 08/20/2020 1349   AST 25 07/02/2014 2254   ALT 15 08/20/2020 1349   ALT 24 07/02/2014 2254   ALKPHOS 73 08/20/2020 1349   ALKPHOS 229 (H) 07/02/2014 2254   BILITOT 0.5 08/20/2020 1349   BILITOT 0.4 07/02/2014 2254   GFRNONAA >60 08/20/2020 1349   GFRNONAA >60 07/02/2014 2254   GFRAA >60 04/25/2020 1008   GFRAA >60 07/02/2014 2254     Assessment and Plan: 1. History of DVT (deep vein thrombosis)   2. Normocytic anemia   3. History of pulmonary embolism     #History of acute unprovoked PE and lower extremity DVT Continue Eliquis 2.5 mg twice daily for anticoagulation prophylaxis.  She tolerates well.  #Iron deficiency anemia, patient has been on oral iron supplementation Vitron C once daily and hemoglobin and iron panel slightly improved but not yet normalized.  Patient prefers to continue with oral iron supplementation.  She is aware about the IV Venofer option.  Follow Up Instructions: 6 months   I discussed the assessment and treatment plan with the patient. The patient was provided an opportunity to ask questions and all were answered. The patient agreed with the plan and demonstrated an understanding of the instructions.  The patient was advised to call back or seek an in-person evaluation if the symptoms worsen or if the condition fails to improve as anticipated.    Rickard Patience, MD 08/22/2020 9:53 AM

## 2020-10-24 ENCOUNTER — Other Ambulatory Visit: Payer: Self-pay

## 2020-10-24 ENCOUNTER — Emergency Department
Admission: EM | Admit: 2020-10-24 | Discharge: 2020-10-24 | Discharge status: Against Medical Advice | Payer: Medicaid Other

## 2021-02-20 ENCOUNTER — Inpatient Hospital Stay: Payer: Medicaid Other | Attending: Oncology

## 2021-02-20 ENCOUNTER — Other Ambulatory Visit: Payer: Self-pay

## 2021-02-20 DIAGNOSIS — Z86711 Personal history of pulmonary embolism: Secondary | ICD-10-CM | POA: Insufficient documentation

## 2021-02-20 DIAGNOSIS — Z8042 Family history of malignant neoplasm of prostate: Secondary | ICD-10-CM | POA: Insufficient documentation

## 2021-02-20 DIAGNOSIS — Z808 Family history of malignant neoplasm of other organs or systems: Secondary | ICD-10-CM | POA: Insufficient documentation

## 2021-02-20 DIAGNOSIS — Z7901 Long term (current) use of anticoagulants: Secondary | ICD-10-CM | POA: Insufficient documentation

## 2021-02-20 DIAGNOSIS — Z86718 Personal history of other venous thrombosis and embolism: Secondary | ICD-10-CM | POA: Insufficient documentation

## 2021-02-20 DIAGNOSIS — D649 Anemia, unspecified: Secondary | ICD-10-CM | POA: Insufficient documentation

## 2021-02-20 DIAGNOSIS — Z87891 Personal history of nicotine dependence: Secondary | ICD-10-CM | POA: Insufficient documentation

## 2021-02-20 LAB — CBC WITH DIFFERENTIAL/PLATELET
Abs Immature Granulocytes: 0.02 10*3/uL (ref 0.00–0.07)
Basophils Absolute: 0 10*3/uL (ref 0.0–0.1)
Basophils Relative: 1 %
Eosinophils Absolute: 0.1 10*3/uL (ref 0.0–0.5)
Eosinophils Relative: 2 %
HCT: 35.3 % — ABNORMAL LOW (ref 36.0–46.0)
Hemoglobin: 10.6 g/dL — ABNORMAL LOW (ref 12.0–15.0)
Immature Granulocytes: 0 %
Lymphocytes Relative: 27 %
Lymphs Abs: 1.6 10*3/uL (ref 0.7–4.0)
MCH: 24 pg — ABNORMAL LOW (ref 26.0–34.0)
MCHC: 30 g/dL (ref 30.0–36.0)
MCV: 80 fL (ref 80.0–100.0)
Monocytes Absolute: 0.5 10*3/uL (ref 0.1–1.0)
Monocytes Relative: 8 %
Neutro Abs: 3.8 10*3/uL (ref 1.7–7.7)
Neutrophils Relative %: 62 %
Platelets: 341 10*3/uL (ref 150–400)
RBC: 4.41 MIL/uL (ref 3.87–5.11)
RDW: 14.8 % (ref 11.5–15.5)
WBC: 6.1 10*3/uL (ref 4.0–10.5)
nRBC: 0 % (ref 0.0–0.2)

## 2021-02-20 LAB — IRON AND TIBC
Iron: 25 ug/dL — ABNORMAL LOW (ref 28–170)
Saturation Ratios: 6 % — ABNORMAL LOW (ref 10.4–31.8)
TIBC: 416 ug/dL (ref 250–450)
UIBC: 391 ug/dL

## 2021-02-20 LAB — FERRITIN: Ferritin: 12 ng/mL (ref 11–307)

## 2021-02-24 ENCOUNTER — Inpatient Hospital Stay: Payer: Medicaid Other | Admitting: Oncology

## 2021-02-24 NOTE — Progress Notes (Signed)
Unsuccessful attempts at contacting patient for virtual visit today.

## 2021-03-06 ENCOUNTER — Inpatient Hospital Stay (HOSPITAL_BASED_OUTPATIENT_CLINIC_OR_DEPARTMENT_OTHER): Payer: Medicaid Other | Admitting: Oncology

## 2021-03-06 ENCOUNTER — Encounter: Payer: Self-pay | Admitting: Oncology

## 2021-03-06 DIAGNOSIS — Z86718 Personal history of other venous thrombosis and embolism: Secondary | ICD-10-CM

## 2021-03-06 DIAGNOSIS — D500 Iron deficiency anemia secondary to blood loss (chronic): Secondary | ICD-10-CM | POA: Diagnosis not present

## 2021-03-06 DIAGNOSIS — Z86711 Personal history of pulmonary embolism: Secondary | ICD-10-CM | POA: Diagnosis not present

## 2021-03-06 MED ORDER — VITRON-C 65-125 MG PO TABS: 1.0000 | ORAL_TABLET | Freq: Every day | ORAL | 1 refills | Status: AC

## 2021-03-06 MED ORDER — DOCUSATE SODIUM 100 MG PO CAPS: 100.0000 mg | ORAL_CAPSULE | Freq: Every day | ORAL | 1 refills | Status: DC

## 2021-03-07 MED ORDER — APIXABAN 2.5 MG PO TABS: 2.5000 mg | ORAL_TABLET | Freq: Two times a day (BID) | ORAL | 1 refills | Status: DC

## 2021-03-07 NOTE — Progress Notes (Signed)
HEMATOLOGY-ONCOLOGY TeleHEALTH VISIT PROGRESS NOTE  I connected with Kimberly Nolan on 03/07/21 at  2:30 PM EDT by video enabled telemedicine visit and verified that I am speaking with the correct person using two identifiers. I discussed the limitations, risks, security and privacy concerns of performing an evaluation and management service by telemedicine and the availability of in-person appointments. I also discussed with the patient that there may be a patient responsible charge related to this service. The patient expressed understanding and agreed to proceed.   Other persons participating in the visit and their role in the encounter:  None  Patient's location: Home  Provider's location: office Chief Complaint: History of pulmonary embolism, chronic anticoagulation   INTERVAL HISTORY Kimberly Nolan is a 35 y.o. female who has above history reviewed by me today presents for follow up visit for management of PE and chronic anticoagulation, iron deficiency Problems and complaints are listed below:  Patient reports feeling well today. She is on Eliquis 2.5 mg twice daily daily for anticoagulation prophylaxis for previous history of VTE. She tolerates well.  Denies any bleeding events. Takes oral iron supplementation. Chronic lower extremity swelling, not worse   Review of Systems  Constitutional: Negative for appetite change, chills, fatigue and fever.  HENT:   Negative for hearing loss and voice change.   Eyes: Negative for eye problems.  Respiratory: Negative for chest tightness, cough and shortness of breath.   Cardiovascular: Positive for leg swelling. Negative for chest pain.  Gastrointestinal: Negative for abdominal distention, abdominal pain and blood in stool.  Endocrine: Negative for hot flashes.  Genitourinary: Negative for difficulty urinating and frequency.   Musculoskeletal: Negative for arthralgias.  Skin: Negative for itching and rash.  Neurological: Negative  for extremity weakness.  Hematological: Negative for adenopathy.  Psychiatric/Behavioral: Negative for confusion.    Past Medical History:  Diagnosis Date  . Anginal pain (HCC)   . Dyspnea   . Essential hypertension   . Family history of adverse reaction to anesthesia    Mother slow to wake up after surgery  . Graves disease   . Lower extremity edema   . Morbid obesity (HCC)   . Smoker    Past Surgical History:  Procedure Laterality Date  . CESAREAN SECTION    . PARATHYROIDECTOMY  06/27/2019   Procedure: PARATHYROIDECTOMY AUTOTRANSPLANT;  Surgeon: Duanne Guess, MD;  Location: [facility] ORS;  Service: General;;  . THYROIDECTOMY N/A 06/27/2019   Procedure: TOTAL THYROIDECTOMY ;  Surgeon: Duanne Guess, MD;  Location: [facility] ORS;  Service: General;  Laterality: N/A;    Family History  Problem Relation Age of Onset  . Thyroid disease Mother   . Hypertension Father   . Diabetes Brother   . Hypertension Brother   . Brain cancer Maternal Aunt   . Prostate cancer Maternal Grandfather     Social History   Socioeconomic History  . Marital status: Significant Other    Spouse name: Not on file  . Number of children: Not on file  . Years of education: Not on file  . Highest education level: Not on file  Occupational History  . Not on file  Tobacco Use  . Smoking status: Former Smoker    Packs/day: 0.50    Years: 3.00    Pack years: 1.50    Types: Cigarettes    Quit date: 10/16/2019    Years since quitting: 1.3  . Smokeless tobacco: Never Used  Vaping Use  . Vaping Use: Never used  Substance and  Sexual Activity  . Alcohol use: Not Currently    Comment: Occasionally  . Drug use: No  . Sexual activity: Yes    Partners: Male  Other Topics Concern  . Not on file  Social History Narrative  . Not on file   Social Determinants of Health   Financial Resource Strain: Not on file  Food Insecurity: Not on file  Transportation Needs: Not on file  Physical Activity: Not on  file  Stress: Not on file  Social Connections: Not on file  Intimate Partner Violence: Not on file    Current Outpatient Medications on File Prior to Visit  Medication Sig Dispense Refill  . apixaban (ELIQUIS) 2.5 MG TABS tablet Take 1 tablet (2.5 mg total) by mouth 2 (two) times daily. 60 tablet 3  . baclofen (LIORESAL) 10 MG tablet Take 10 mg by mouth every 8 (eight) hours as needed.    . gabapentin (NEURONTIN) 300 MG capsule gabapentin 300 mg capsule  PLEASE SEE ATTACHED FOR DETAILED DIRECTIONS    . levothyroxine (SYNTHROID) 200 MCG tablet Take 200 mcg by mouth daily.    Marland Kitchen HYDROcodone-acetaminophen (NORCO/VICODIN) 5-325 MG tablet Take 1 tablet by mouth every 6 (six) hours as needed for moderate pain. (Patient not taking: Reported on 04/25/2020)    . topiramate (TOPAMAX) 25 MG tablet Take 25 mg before bed for one week. Then take 50 mg before bed for one week. Then 75 mg before bed for one week. Then 1000 mg before bed onward. (Patient not taking: Reported on 01/16/2020) 180 tablet 0   No current facility-administered medications on file prior to visit.    Allergies  Allergen Reactions  . Penicillins Itching and Rash    Did it involve swelling of the face/tongue/throat, SOB, or low BP? No Did it involve sudden or severe rash/hives, skin peeling, or any reaction on the inside of your mouth or nose? No Did you need to seek medical attention at a hospital or doctor's office? No When did it last happen?10+ years ago If all above answers are "NO", may proceed with cephalosporin use.        Observations/Objective: Today's Vitals   03/06/21 1333  PainSc: 10-Worst pain ever   There is no height or weight on file to calculate BMI.  Physical Exam Constitutional:      General: She is not in acute distress. Neurological:     Mental Status: She is alert.     CBC    Component Value Date/Time   WBC 6.1 02/20/2021 1120   RBC 4.41 02/20/2021 1120   HGB 10.6 (L) 02/20/2021 1120    HGB 11.9 (L) 07/02/2014 2254   HCT 35.3 (L) 02/20/2021 1120   HCT 36.9 07/02/2014 2254   PLT 341 02/20/2021 1120   PLT 165 07/02/2014 2254   MCV 80.0 02/20/2021 1120   MCV 80 07/02/2014 2254   MCH 24.0 (L) 02/20/2021 1120   MCHC 30.0 02/20/2021 1120   RDW 14.8 02/20/2021 1120   RDW 12.7 07/02/2014 2254   LYMPHSABS 1.6 02/20/2021 1120   LYMPHSABS 2.3 07/02/2014 2254   MONOABS 0.5 02/20/2021 1120   MONOABS 0.7 07/02/2014 2254   EOSABS 0.1 02/20/2021 1120   EOSABS 0.2 07/02/2014 2254   BASOSABS 0.0 02/20/2021 1120   BASOSABS 0.0 07/02/2014 2254    CMP     Component Value Date/Time   NA 137 08/20/2020 1349   NA 140 07/02/2014 2254   K 3.6 08/20/2020 1349   K 3.7 07/02/2014 2254  CL 101 08/20/2020 1349   CL 106 07/02/2014 2254   CO2 28 08/20/2020 1349   CO2 27 07/02/2014 2254   GLUCOSE 116 (H) 08/20/2020 1349   GLUCOSE 128 (H) 07/02/2014 2254   BUN 12 08/20/2020 1349   BUN 9 07/02/2014 2254   CREATININE 0.85 08/20/2020 1349   CREATININE 0.50 (L) 07/02/2014 2254   CALCIUM 8.1 (L) 08/20/2020 1349   CALCIUM 8.4 (L) 07/02/2014 2254   PROT 8.1 08/20/2020 1349   PROT 8.1 07/02/2014 2254   ALBUMIN 3.3 (L) 08/20/2020 1349   ALBUMIN 2.8 (L) 07/02/2014 2254   AST 12 (L) 08/20/2020 1349   AST 25 07/02/2014 2254   ALT 15 08/20/2020 1349   ALT 24 07/02/2014 2254   ALKPHOS 73 08/20/2020 1349   ALKPHOS 229 (H) 07/02/2014 2254   BILITOT 0.5 08/20/2020 1349   BILITOT 0.4 07/02/2014 2254   GFRNONAA >60 08/20/2020 1349   GFRNONAA >60 07/02/2014 2254   GFRAA >60 04/25/2020 1008   GFRAA >60 07/02/2014 2254     Assessment and Plan: 1. History of DVT (deep vein thrombosis)   2. History of pulmonary embolism   3. Iron deficiency anemia due to chronic blood loss     #History of acute unprovoked PE and lower extremity DVT Labs are reviewed and discussed with patient. Clinically she is doing well and will tolerate medication. Continue Eliquis 2.5 mg twice daily for  prophylaxis.   #Iron deficiency anemia, hemoglobin has decreased to 10.6.  Likely due to menstrual blood loss. Patient prefers to continue oral iron supplementation with vitamin C once daily. She is aware about the IV Venofer treatment option.  Patient prefers to continue virtual visit due to lack of transportation.  She understands the limitation of virtual visits.  Follow Up Instructions: 6 months   I discussed the assessment and treatment plan with the patient. The patient was provided an opportunity to ask questions and all were answered. The patient agreed with the plan and demonstrated an understanding of the instructions.  The patient was advised to call back or seek an in-person evaluation if the symptoms worsen or if the condition fails to improve as anticipated.    Rickard Patience, MD 03/07/2021 12:25 PM

## 2021-08-05 ENCOUNTER — Encounter: Payer: Self-pay | Admitting: General Surgery

## 2021-09-04 ENCOUNTER — Inpatient Hospital Stay: Payer: Medicaid Other | Attending: Oncology

## 2021-09-04 ENCOUNTER — Other Ambulatory Visit: Payer: Self-pay

## 2021-09-04 DIAGNOSIS — Z86718 Personal history of other venous thrombosis and embolism: Secondary | ICD-10-CM

## 2021-09-04 DIAGNOSIS — Z7901 Long term (current) use of anticoagulants: Secondary | ICD-10-CM | POA: Insufficient documentation

## 2021-09-04 DIAGNOSIS — Z808 Family history of malignant neoplasm of other organs or systems: Secondary | ICD-10-CM | POA: Insufficient documentation

## 2021-09-04 DIAGNOSIS — Z86711 Personal history of pulmonary embolism: Secondary | ICD-10-CM | POA: Diagnosis not present

## 2021-09-04 DIAGNOSIS — D500 Iron deficiency anemia secondary to blood loss (chronic): Secondary | ICD-10-CM | POA: Diagnosis not present

## 2021-09-04 DIAGNOSIS — Z87891 Personal history of nicotine dependence: Secondary | ICD-10-CM | POA: Insufficient documentation

## 2021-09-04 DIAGNOSIS — N920 Excessive and frequent menstruation with regular cycle: Secondary | ICD-10-CM | POA: Insufficient documentation

## 2021-09-04 DIAGNOSIS — Z8042 Family history of malignant neoplasm of prostate: Secondary | ICD-10-CM | POA: Diagnosis not present

## 2021-09-04 LAB — COMPREHENSIVE METABOLIC PANEL
ALT: 14 U/L (ref 0–44)
AST: 13 U/L — ABNORMAL LOW (ref 15–41)
Albumin: 3.3 g/dL — ABNORMAL LOW (ref 3.5–5.0)
Alkaline Phosphatase: 85 U/L (ref 38–126)
Anion gap: 6 (ref 5–15)
BUN: 9 mg/dL (ref 6–20)
CO2: 28 mmol/L (ref 22–32)
Calcium: 8.1 mg/dL — ABNORMAL LOW (ref 8.9–10.3)
Chloride: 101 mmol/L (ref 98–111)
Creatinine, Ser: 0.86 mg/dL (ref 0.44–1.00)
GFR, Estimated: >60 mL/min (ref >60)
Glucose, Bld: 185 mg/dL — ABNORMAL HIGH (ref 70–99)
Potassium: 3.4 mmol/L — ABNORMAL LOW (ref 3.5–5.1)
Sodium: 135 mmol/L (ref 135–145)
Total Bilirubin: 0.3 mg/dL (ref 0.3–1.2)
Total Protein: 8.3 g/dL — ABNORMAL HIGH (ref 6.5–8.1)

## 2021-09-04 LAB — CBC WITH DIFFERENTIAL/PLATELET
Abs Immature Granulocytes: 0.02 10*3/uL (ref 0.00–0.07)
Basophils Absolute: 0 10*3/uL (ref 0.0–0.1)
Basophils Relative: 1 %
Eosinophils Absolute: 0.1 10*3/uL (ref 0.0–0.5)
Eosinophils Relative: 2 %
HCT: 35.7 % — ABNORMAL LOW (ref 36.0–46.0)
Hemoglobin: 10.6 g/dL — ABNORMAL LOW (ref 12.0–15.0)
Immature Granulocytes: 0 %
Lymphocytes Relative: 30 %
Lymphs Abs: 1.6 10*3/uL (ref 0.7–4.0)
MCH: 23.8 pg — ABNORMAL LOW (ref 26.0–34.0)
MCHC: 29.7 g/dL — ABNORMAL LOW (ref 30.0–36.0)
MCV: 80.2 fL (ref 80.0–100.0)
Monocytes Absolute: 0.3 10*3/uL (ref 0.1–1.0)
Monocytes Relative: 7 %
Neutro Abs: 3.1 10*3/uL (ref 1.7–7.7)
Neutrophils Relative %: 60 %
Platelets: 315 10*3/uL (ref 150–400)
RBC: 4.45 MIL/uL (ref 3.87–5.11)
RDW: 16.3 % — ABNORMAL HIGH (ref 11.5–15.5)
WBC: 5.2 10*3/uL (ref 4.0–10.5)
nRBC: 0 % (ref 0.0–0.2)

## 2021-09-04 LAB — IRON AND TIBC
Iron: 28 ug/dL (ref 28–170)
Saturation Ratios: 7 % — ABNORMAL LOW (ref 10.4–31.8)
TIBC: 400 ug/dL (ref 250–450)
UIBC: 372 ug/dL

## 2021-09-04 LAB — FERRITIN: Ferritin: 11 ng/mL (ref 11–307)

## 2021-09-08 ENCOUNTER — Encounter: Payer: Self-pay | Admitting: Oncology

## 2021-09-08 ENCOUNTER — Inpatient Hospital Stay (HOSPITAL_BASED_OUTPATIENT_CLINIC_OR_DEPARTMENT_OTHER): Payer: Medicaid Other | Admitting: Oncology

## 2021-09-08 DIAGNOSIS — D500 Iron deficiency anemia secondary to blood loss (chronic): Secondary | ICD-10-CM | POA: Diagnosis not present

## 2021-09-08 DIAGNOSIS — Z86718 Personal history of other venous thrombosis and embolism: Secondary | ICD-10-CM

## 2021-09-08 DIAGNOSIS — N920 Excessive and frequent menstruation with regular cycle: Secondary | ICD-10-CM

## 2021-09-08 DIAGNOSIS — Z7901 Long term (current) use of anticoagulants: Secondary | ICD-10-CM

## 2021-09-08 MED ORDER — APIXABAN 2.5 MG PO TABS: 2.5000 mg | ORAL_TABLET | Freq: Two times a day (BID) | ORAL | 1 refills | Status: DC

## 2021-09-08 NOTE — Progress Notes (Signed)
HEMATOLOGY-ONCOLOGY TeleHEALTH VISIT PROGRESS NOTE  I connected with Kimberly Nolan on 09/08/21 at  2:30 PM EDT by video enabled telemedicine visit and verified that I am speaking with the correct person using two identifiers. I discussed the limitations, risks, security and privacy concerns of performing an evaluation and management service by telemedicine and the availability of in-person appointments. I also discussed with the patient that there may be a patient responsible charge related to this service. The patient expressed understanding and agreed to proceed.   Other persons participating in the visit and their role in the encounter:  None  Patient's location: Home  Provider's location: office Chief Complaint: History of pulmonary embolism, chronic anticoagulation   INTERVAL HISTORY Kimberly Nolan is a 35 y.o. female who has above history reviewed by me today presents for follow up visit for management of PE and chronic anticoagulation, iron deficiency Problems and complaints are listed below:  Patient reports feeling well today. On Eliquis 2.5mg  BID.  No bleeding events.  She currently does not take oral iron supplementation.     Review of Systems  Constitutional:  Negative for appetite change, chills, fatigue and fever.  HENT:   Negative for hearing loss and voice change.   Eyes:  Negative for eye problems.  Respiratory:  Negative for chest tightness, cough and shortness of breath.   Cardiovascular:  Positive for leg swelling. Negative for chest pain.  Gastrointestinal:  Negative for abdominal distention, abdominal pain and blood in stool.  Endocrine: Negative for hot flashes.  Genitourinary:  Negative for difficulty urinating and frequency.   Musculoskeletal:  Negative for arthralgias.  Skin:  Negative for itching and rash.  Neurological:  Negative for extremity weakness.  Hematological:  Negative for adenopathy.  Psychiatric/Behavioral:  Negative for confusion.     Past Medical History:  Diagnosis Date   Anginal pain (HCC)    Dyspnea    Essential hypertension    Family history of adverse reaction to anesthesia    Mother slow to wake up after surgery   Graves disease    Lower extremity edema    Morbid obesity (HCC)    Smoker    Past Surgical History:  Procedure Laterality Date   CESAREAN SECTION     PARATHYROIDECTOMY  06/27/2019   Procedure: PARATHYROIDECTOMY AUTOTRANSPLANT;  Surgeon: Duanne Guess, MD;  Location: [facility] OR[facility];  Service: General;;   THYROIDECTOMY N/A 06/27/2019   Procedure: TOTAL THYROIDECTOMY ;  Surgeon: Duanne Guess, MD;  Location: [facility] OR[facility];  Service: General;  Laterality: N/A;    Family History  Problem Relation Age of Onset   Thyroid disease Mother    Hypertension Father    Diabetes Brother    Hypertension Brother    Brain cancer Maternal Aunt    Prostate cancer Maternal Grandfather     Social History   Socioeconomic History   Marital status: Significant Other    Spouse name: Not on file   Number of children: Not on file   Years of education: Not on file   Highest education level: Not on file  Occupational History   Not on file  Tobacco Use   Smoking status: Former    Packs/day: 0.50    Years: 3.00    Pack years: 1.50    Types: Cigarettes    Quit date: 10/16/2019    Years since quitting: 1.8   Smokeless tobacco: Never  Vaping Use   Vaping Use: Never used  Substance and Sexual Activity   Alcohol use: Not  Currently    Comment: Occasionally   Drug use: No   Sexual activity: Yes    Partners: Male  Other Topics Concern   Not on file  Social History Narrative   Not on file   Social Determinants of Health   Financial Resource Strain: Not on file  Food Insecurity: Not on file  Transportation Needs: Not on file  Physical Activity: Not on file  Stress: Not on file  Social Connections: Not on file  Intimate Partner Violence: Not on file    Current Outpatient Medications on File Prior to Visit   Medication Sig Dispense Refill   baclofen (LIORESAL) 10 MG tablet Take 10 mg by mouth every 8 (eight) hours as needed.     docusate sodium (COLACE) 100 MG capsule Take 1 capsule (100 mg total) by mouth daily. 90 capsule 1   gabapentin (NEURONTIN) 300 MG capsule gabapentin 300 mg capsule  PLEASE SEE ATTACHED FOR DETAILED DIRECTIONS     Iron-Vitamin C (VITRON-C) 65-125 MG TABS Take 1 tablet by mouth daily. 90 tablet 1   levothyroxine (SYNTHROID) 200 MCG tablet Take 200 mcg by mouth daily.     metFORMIN (GLUCOPHAGE-XR) 500 MG 24 hr tablet Take 500 mg by mouth every morning.     No current facility-administered medications on file prior to visit.    Allergies  Allergen Reactions   Penicillins Itching and Rash    Did it involve swelling of the face/tongue/throat, SOB, or low BP? No Did it involve sudden or severe rash/hives, skin peeling, or any reaction on the inside of your mouth or nose? No Did you need to seek medical attention at a hospital or doctor's office? No When did it last happen?      10+ years ago If all above answers are "NO", may proceed with cephalosporin use.        Observations/Objective: There were no vitals filed for this visit. There is no height or weight on file to calculate BMI.  Physical Exam Constitutional:      General: She is not in acute distress. Neurological:     Mental Status: She is alert.    CBC    Component Value Date/Time   WBC 5.2 09/04/2021 1348   RBC 4.45 09/04/2021 1348   HGB 10.6 (L) 09/04/2021 1348   HGB 11.9 (L) 07/02/2014 2254   HCT 35.7 (L) 09/04/2021 1348   HCT 36.9 07/02/2014 2254   PLT 315 09/04/2021 1348   PLT 165 07/02/2014 2254   MCV 80.2 09/04/2021 1348   MCV 80 07/02/2014 2254   MCH 23.8 (L) 09/04/2021 1348   MCHC 29.7 (L) 09/04/2021 1348   RDW 16.3 (H) 09/04/2021 1348   RDW 12.7 07/02/2014 2254   LYMPHSABS 1.6 09/04/2021 1348   LYMPHSABS 2.3 07/02/2014 2254   MONOABS 0.3 09/04/2021 1348   MONOABS 0.7 07/02/2014  2254   EOSABS 0.1 09/04/2021 1348   EOSABS 0.2 07/02/2014 2254   BASOSABS 0.0 09/04/2021 1348   BASOSABS 0.0 07/02/2014 2254    CMP     Component Value Date/Time   NA 135 09/04/2021 1348   NA 140 07/02/2014 2254   K 3.4 (L) 09/04/2021 1348   K 3.7 07/02/2014 2254   CL 101 09/04/2021 1348   CL 106 07/02/2014 2254   CO2 28 09/04/2021 1348   CO2 27 07/02/2014 2254   GLUCOSE 185 (H) 09/04/2021 1348   GLUCOSE 128 (H) 07/02/2014 2254   BUN 9 09/04/2021 1348   BUN 9  07/02/2014 2254   CREATININE 0.86 09/04/2021 1348   CREATININE 0.50 (L) 07/02/2014 2254   CALCIUM 8.1 (L) 09/04/2021 1348   CALCIUM 8.4 (L) 07/02/2014 2254   PROT 8.3 (H) 09/04/2021 1348   PROT 8.1 07/02/2014 2254   ALBUMIN 3.3 (L) 09/04/2021 1348   ALBUMIN 2.8 (L) 07/02/2014 2254   AST 13 (L) 09/04/2021 1348   AST 25 07/02/2014 2254   ALT 14 09/04/2021 1348   ALT 24 07/02/2014 2254   ALKPHOS 85 09/04/2021 1348   ALKPHOS 229 (H) 07/02/2014 2254   BILITOT 0.3 09/04/2021 1348   BILITOT 0.4 07/02/2014 2254   GFRNONAA >60 09/04/2021 1348   GFRNONAA >60 07/02/2014 2254   GFRAA >60 04/25/2020 1008   GFRAA >60 07/02/2014 2254     Assessment and Plan: 1. History of DVT (deep vein thrombosis)   2. Iron deficiency anemia due to chronic blood loss     #History of acute unprovoked PE and lower extremity DVT Labs are reviewed and discussed with patient. Continue Eliquis 2.5mg  BID for anticoagulation prophylaxis.   #Iron deficiency anemia, hemoglobin is 10.6.ferritin 11, iron saturation 7  Likely due to menstrual blood loss. Patient prefers to continue oral iron supplementation. She takes OTC supply.  She is aware about the IV Venofer treatment option.  Patient prefers to continue virtual visit due to lack of transportation.  She understands the limitation of virtual visits.  Follow Up Instructions: 6 months   I discussed the assessment and treatment plan with the patient. The patient was provided an opportunity to  ask questions and all were answered. The patient agreed with the plan and demonstrated an understanding of the instructions.  The patient was advised to call back or seek an in-person evaluation if the symptoms worsen or if the condition fails to improve as anticipated.    Rickard Patience, MD 09/08/2021 8:04 PM

## 2021-09-08 NOTE — Progress Notes (Signed)
Patient contacted for Mychart visit. No new concerns voiced.  

## 2021-11-10 ENCOUNTER — Other Ambulatory Visit: Payer: Self-pay | Admitting: Physician Assistant

## 2021-11-10 DIAGNOSIS — M79606 Pain in leg, unspecified: Secondary | ICD-10-CM

## 2021-11-10 DIAGNOSIS — R202 Paresthesia of skin: Secondary | ICD-10-CM

## 2022-01-07 ENCOUNTER — Emergency Department
Admission: EM | Admit: 2022-01-07 | Discharge: 2022-01-07 | Disposition: A | Discharge status: Home / Self Care | Payer: Medicaid Other | Attending: Emergency Medicine | Admitting: Emergency Medicine

## 2022-01-07 ENCOUNTER — Encounter: Payer: Self-pay | Admitting: Emergency Medicine

## 2022-01-07 ENCOUNTER — Emergency Department: Payer: Medicaid Other

## 2022-01-07 ENCOUNTER — Other Ambulatory Visit: Payer: Self-pay

## 2022-01-07 DIAGNOSIS — S83411A Sprain of medial collateral ligament of right knee, initial encounter: Secondary | ICD-10-CM | POA: Insufficient documentation

## 2022-01-07 DIAGNOSIS — I10 Essential (primary) hypertension: Secondary | ICD-10-CM | POA: Diagnosis not present

## 2022-01-07 DIAGNOSIS — X58XXXA Exposure to other specified factors, initial encounter: Secondary | ICD-10-CM | POA: Insufficient documentation

## 2022-01-07 DIAGNOSIS — S8991XA Unspecified injury of right lower leg, initial encounter: Secondary | ICD-10-CM | POA: Diagnosis present

## 2022-01-07 DIAGNOSIS — M79604 Pain in right leg: Secondary | ICD-10-CM | POA: Diagnosis not present

## 2022-01-07 MED ORDER — OXYCODONE HCL 5 MG PO TABS: 5.0000 mg | ORAL_TABLET | Freq: Four times a day (QID) | ORAL | 0 refills | Status: AC | PRN

## 2022-01-07 NOTE — Discharge Instructions (Addendum)
-  Take Tylenol/ibuprofen as needed for pain. -Follow-up with the orthopedist listed above if pain fails to improve after a few weeks of rest. -Return to the emergency department anytime if you begin to experience any new or worsening symptoms.

## 2022-01-07 NOTE — ED Provider Notes (Signed)
[facility] Provider Note    Event Date/Time   First MD Initiated Contact with Patient 01/07/22 1100     (approximate)   History   Chief Complaint Leg Pain   HPI Kimberly Nolan is a 36 y.o. female, history of hypertension, DVT, Graves' disease, prediabetes, and morbid obesity, presents to the emergency department for evaluation of leg pain.  Patient states that the pain has been going on for the past week.  Denies any recent illnesses, injuries, or falls.  She states that pain is mostly along the medial aspect of her right knee.  She states that she is able to walk, though with difficulty due to the pain.  Denies numbness/tingling in lower extremities, shortness of breath, chest pain, abdominal pain, fever, nausea/vomiting, thigh pain, back pain, or saddle anesthesia.   History Limitations: No limitations      Physical Exam  Triage Vital Signs: ED Triage Vitals  Enc Vitals Group     BP 01/07/22 1032 (!) 178/75     Pulse Rate 01/07/22 1032 85     Resp 01/07/22 1032 20     Temp 01/07/22 1032 98.7 F (37.1 C)     Temp Source 01/07/22 1032 Oral     SpO2 01/07/22 1032 96 %     Weight 01/07/22 1031 (!) 435 lb (197.3 kg)     Height 01/07/22 1031 5\' 7"  (1.702 m)     Head Circumference --      Peak Flow --      Pain Score 01/07/22 1031 8     Pain Loc --      Pain Edu? --      Excl. in GC? --     Most recent vital signs: Vitals:   01/07/22 1032  BP: (!) 178/75  Pulse: 85  Resp: 20  Temp: 98.7 F (37.1 C)  SpO2: 96%    General: Awake, NAD.  CV: Good peripheral perfusion.  Resp: Normal effort.  Abd: Soft, non-tender. No distention.  Neuro: At baseline. No gross neurological deficits. Other: No gross deformities.  Tenderness when palpating the medial aspect of the right knee.  Pain elicited with valgus stress.  Negative anterior drawer.  No warmth or erythema.  No notable swelling, though difficult to tell given the patient's large natural  body habitus.  Pulse, motor, sensation intact distally.  Normal range of motion of the right lower extremity.  Physical Exam    ED Results / Procedures / Treatments  Labs (all labs ordered are listed, but only abnormal results are displayed) Labs Reviewed - No data to display   EKG Not applicable.   RADIOLOGY  ED Provider Interpretation: I personally reviewed and interpreted these images.  Venous ultrasound negative for DVT.  Knee x-ray shows no acute findings.  01/09/22 Venous Img Lower Unilateral Right  Result Date: 01/07/2022 CLINICAL DATA:  Leg pain since 12/31/2021 EXAM: RIGHT LOWER EXTREMITY VENOUS DOPPLER ULTRASOUND TECHNIQUE: Gray-scale sonography with compression, as well as color and duplex ultrasound, were performed to evaluate the deep venous system(s) from the level of the common femoral vein through the popliteal and proximal calf veins. COMPARISON:  None. FINDINGS: VENOUS Normal compressibility of the common femoral, superficial femoral, and popliteal veins, as well as the visualized calf veins. Visualized portions of profunda femoral vein and great saphenous vein unremarkable. No filling defects to suggest DVT on grayscale or color Doppler imaging. Doppler waveforms show normal direction of venous flow, normal respiratory plasticity and response to  augmentation. Limited views of the contralateral common femoral vein are unremarkable. OTHER None. Limitations: none IMPRESSION: No right lower extremity VT. Electronically Signed   By: Acquanetta Belling M.D.   On: 01/07/2022 11:36   DG Knee Complete 4 Views Right  Result Date: 01/07/2022 CLINICAL DATA:  Knee pain EXAM: RIGHT KNEE - COMPLETE 4+ VIEW COMPARISON:  None. FINDINGS: No evidence of acute fracture or joint malalignment. Probable small joint effusion. Mild medial compartment joint space loss. Patellar enthesophytes. IMPRESSION: No evidence of acute fracture or joint malalignment. Electronically Signed   By: Feliberto Harts M.D.    On: 01/07/2022 13:03    PROCEDURES:  Critical Care performed: None  Procedures    MEDICATIONS ORDERED IN ED: Medications - No data to display   IMPRESSION / MDM / ASSESSMENT AND PLAN / ED COURSE  I reviewed the triage vital signs and the nursing notes.                              Kimberly Nolan is a 36 y.o. female, history of hypertension, DVT, Graves' disease, prediabetes, and morbid obesity, presents to the emergency department for evaluation of leg pain.  Patient states that the pain has been going on for the past week.  Denies any recent illnesses, injuries, or falls.  She states that pain is mostly along the medial aspect of her right knee.  She states that she is able to walk, though with difficulty due to the pain.   Differential diagnosis includes, but is not limited to, DVT, patellar fracture, knee dislocation, proximal tibial fracture, distal femur fracture, MCL sprain/tear  ED Course Patient appears well.  Vitals within normal limits of the patient.  NAD.  Ultrasound negative for DVT.  Knee x-ray shows no acute fractures or dislocations.  Assessment/Plan History, physical exam, and work-up consistent with MCL strain.  Knee appears stable.  Work-up reassuring for no DVT or acute fractures.  No immobilization required.  Attempted to find flexible knee brace for patient's comfort, but unable to find one in her size.  We will plan to discharge this patient with referral to orthopedics if her symptoms fail to improve with rest and conservative management.  Encouraged her to take Tylenol/ibuprofen as needed.  We will provide short supply of oxycodone to be used as needed.  Patient was provided with anticipatory guidance, return precautions, and educational material. Encouraged the patient to return to the emergency department at any time if they begin to experience any new or worsening symptoms.       FINAL CLINICAL IMPRESSION(S) / ED DIAGNOSES   Final diagnoses:   Sprain of medial collateral ligament of right knee, initial encounter     Rx / DC Orders   ED Discharge Orders          Ordered    oxyCODONE (ROXICODONE) 5 MG immediate release tablet  Every 6 hours PRN        01/07/22 1353             Note:  This document was prepared using Dragon voice recognition software and may include unintentional dictation errors.   Varney Daily, [place] 01/07/22 1353    Arnaldo Natal, MD 01/07/22 [phone]

## 2022-01-07 NOTE — ED Triage Notes (Signed)
Pt here with right leg pain that started on the 16th. Pt states pain has been constant and she is now having trouble walking. Pt in NAD in triage.

## 2022-01-07 NOTE — ED Notes (Signed)
See triage note  presents with pain to medial aspect of right knee  states pain started a few days ago  denies any injury

## 2022-01-26 ENCOUNTER — Inpatient Hospital Stay: Admission: RE | Admit: 2022-01-26 | Payer: Medicaid Other | Source: Ambulatory Visit

## 2022-02-11 ENCOUNTER — Ambulatory Visit
Admission: RE | Admit: 2022-02-11 | Discharge: 2022-02-11 | Disposition: A | Discharge status: Home / Self Care | Payer: Medicaid Other | Source: Ambulatory Visit | Attending: Physician Assistant | Admitting: Physician Assistant

## 2022-02-11 DIAGNOSIS — M79606 Pain in leg, unspecified: Secondary | ICD-10-CM

## 2022-02-11 DIAGNOSIS — R202 Paresthesia of skin: Secondary | ICD-10-CM

## 2022-03-09 ENCOUNTER — Inpatient Hospital Stay: Payer: Medicaid Other | Attending: Oncology

## 2022-03-09 DIAGNOSIS — D500 Iron deficiency anemia secondary to blood loss (chronic): Secondary | ICD-10-CM | POA: Insufficient documentation

## 2022-03-09 DIAGNOSIS — Z86718 Personal history of other venous thrombosis and embolism: Secondary | ICD-10-CM

## 2022-03-09 LAB — CBC WITH DIFFERENTIAL/PLATELET
Abs Immature Granulocytes: 0.01 10*3/uL (ref 0.00–0.07)
Basophils Absolute: 0 10*3/uL (ref 0.0–0.1)
Basophils Relative: 1 %
Eosinophils Absolute: 0.1 10*3/uL (ref 0.0–0.5)
Eosinophils Relative: 2 %
HCT: 36.8 % (ref 36.0–46.0)
Hemoglobin: 11.2 g/dL — ABNORMAL LOW (ref 12.0–15.0)
Immature Granulocytes: 0 %
Lymphocytes Relative: 25 %
Lymphs Abs: 1.4 10*3/uL (ref 0.7–4.0)
MCH: 25.5 pg — ABNORMAL LOW (ref 26.0–34.0)
MCHC: 30.4 g/dL (ref 30.0–36.0)
MCV: 83.8 fL (ref 80.0–100.0)
Monocytes Absolute: 0.4 10*3/uL (ref 0.1–1.0)
Monocytes Relative: 7 %
Neutro Abs: 3.6 10*3/uL (ref 1.7–7.7)
Neutrophils Relative %: 65 %
Platelets: 287 10*3/uL (ref 150–400)
RBC: 4.39 MIL/uL (ref 3.87–5.11)
RDW: 15.2 % (ref 11.5–15.5)
WBC: 5.5 10*3/uL (ref 4.0–10.5)
nRBC: 0 % (ref 0.0–0.2)

## 2022-03-09 LAB — IRON AND TIBC
Iron: 31 ug/dL (ref 28–170)
Saturation Ratios: 8 % — ABNORMAL LOW (ref 10.4–31.8)
TIBC: 400 ug/dL (ref 250–450)
UIBC: 369 ug/dL

## 2022-03-09 LAB — FERRITIN: Ferritin: 9 ng/mL — ABNORMAL LOW (ref 11–307)

## 2022-03-11 ENCOUNTER — Inpatient Hospital Stay: Payer: Medicaid Other | Admitting: Oncology

## 2022-04-05 ENCOUNTER — Inpatient Hospital Stay: Payer: Medicaid Other | Attending: Oncology | Admitting: Oncology

## 2022-04-05 ENCOUNTER — Encounter: Payer: Self-pay | Admitting: Oncology

## 2022-04-05 VITALS — BP 167/85 | HR 85 | Temp 97.9°F | Wt >= 6400 oz

## 2022-04-05 DIAGNOSIS — Z7901 Long term (current) use of anticoagulants: Secondary | ICD-10-CM | POA: Insufficient documentation

## 2022-04-05 DIAGNOSIS — D500 Iron deficiency anemia secondary to blood loss (chronic): Secondary | ICD-10-CM | POA: Diagnosis not present

## 2022-04-05 DIAGNOSIS — D509 Iron deficiency anemia, unspecified: Secondary | ICD-10-CM | POA: Insufficient documentation

## 2022-04-05 DIAGNOSIS — Z86718 Personal history of other venous thrombosis and embolism: Secondary | ICD-10-CM | POA: Insufficient documentation

## 2022-04-05 MED ORDER — APIXABAN 2.5 MG PO TABS: 2.5000 mg | ORAL_TABLET | Freq: Two times a day (BID) | ORAL | 1 refills | Status: AC

## 2022-04-05 NOTE — Progress Notes (Signed)
Hematology/Oncology Progress note Telephone:([phone] [phone]      Patient Care Team: System, Provider Not In as PCP - General Gaye Pollack, MD as Attending Physician (Cardiothoracic Surgery) Marybelle Killings, MD (Inactive) as Attending Physician (Radiology) Earlie Server, MD as Consulting Physician (Hematology and Oncology)  REFERRING PROVIDER: No ref. provider found  CHIEF COMPLAINTS/REASON FOR VISIT:   DVT/PE, iron deficiency anemia.  HISTORY OF PRESENTING ILLNESS:   Kimberly Nolan is a  36 y.o.  female with PMH listed below was seen in consultation at the request of  No ref. provider found  for evaluation of DVT/PE. Patient presented to ED on 10/18/2019 due increased cramping pain of left lower extremity and left before ultrasound result did.  Patient was notified about DVT results which prompted her to return to ER for evaluation of leg cramping and progressively worsening shortness of breath.  Patient endorses chronic bilateral lower extremity edema, shortness of breath with exertion. Lower extremity venous Doppler ultrasound showed DVT within the left popliteal vein. CT angiogram of the chest showed bilateral pulmonary emboli in all lobes of both lungs and the findings suggestive of RV strain.  Patient was admitted and anticoagulation with heparin was given.  Patient was discharged on Xarelto starting.  Patient currently is on Xarelto 15 mg twice daily. There was no immobilization factors which may contribute to her events. Patient has morbid obesity and has sedentary lifestyle. Patient recently stopped smoking. Today she reports that left lower extremity cramp has improved.  Continues to have bilateral lower extremity edema which is chronic for her.  Shortness of breath has improved.  Denies any bleeding events.  INTERVAL HISTORY Kimberly Nolan is a 36 y.o. female who has above history reviewed by me today presents for follow up visit for management of history of  DVT/PE, iron deficiency anemia .  Patient is currently taking Eliquis 2.5 mg twice daily.  Overall she tolerates well.  Denies any active bleeding events. Patient has iron deficiency anemia, she takes oral iron supplementation once daily.  She uses over-the-counter supply. Today she feels well.  No new complaints.  Review of Systems  Constitutional:  Positive for fatigue. Negative for appetite change, chills and fever.  HENT:   Negative for hearing loss and voice change.   Eyes:  Negative for eye problems.  Respiratory:  Negative for chest tightness and cough.   Cardiovascular:  Negative for chest pain.  Gastrointestinal:  Negative for abdominal distention, abdominal pain and blood in stool.  Endocrine: Negative for hot flashes.  Genitourinary:  Negative for difficulty urinating and frequency.   Musculoskeletal:  Negative for arthralgias.  Skin:  Negative for itching and rash.  Neurological:  Negative for extremity weakness.  Hematological:  Negative for adenopathy.  Psychiatric/Behavioral:  Negative for confusion.    MEDICAL HISTORY:  Past Medical History:  Diagnosis Date   Anginal pain ([place])    Dyspnea    Essential hypertension    Family history of adverse reaction to anesthesia    Mother slow to wake up after surgery   Graves disease    Lower extremity edema    Morbid obesity ([employer])    Smoker     SURGICAL HISTORY: Past Surgical History:  Procedure Laterality Date   CESAREAN SECTION     PARATHYROIDECTOMY  06/27/2019   Procedure: PARATHYROIDECTOMY AUTOTRANSPLANT;  Surgeon: Fredirick Maudlin, MD;  Location: [place] [facility];  Service: General;;   THYROIDECTOMY N/A 06/27/2019   Procedure: TOTAL THYROIDECTOMY ;  Surgeon: Fredirick Maudlin,  MD;  Location: [facility];  Service: General;  Laterality: N/A;    SOCIAL HISTORY: Social History   Socioeconomic History   Marital status: Single    Spouse name: Not on file   Number of children: Not on file   Years of education: Not on file    Highest education level: Not on file  Occupational History   Not on file  Tobacco Use   Smoking status: Former    Packs/day: 0.50    Years: 3.00    Pack years: 1.50    Types: Cigarettes    Quit date: 10/16/2019    Years since quitting: 2.4   Smokeless tobacco: Never  Vaping Use   Vaping Use: Never used  Substance and Sexual Activity   Alcohol use: Not Currently    Comment: Occasionally   Drug use: No   Sexual activity: Yes    Partners: Male  Other Topics Concern   Not on file  Social History Narrative   Not on file   Social Determinants of Health   Financial Resource Strain: Not on file  Food Insecurity: Not on file  Transportation Needs: Not on file  Physical Activity: Not on file  Stress: Not on file  Social Connections: Not on file  Intimate Partner Violence: Not on file    FAMILY HISTORY: Family History  Problem Relation Age of Onset   Thyroid disease Mother    Hypertension Father    Diabetes Brother    Hypertension Brother    Brain cancer Maternal Aunt    Prostate cancer Maternal Grandfather     ALLERGIES:  is allergic to penicillins.  MEDICATIONS:  Current Outpatient Medications  Medication Sig Dispense Refill   baclofen (LIORESAL) 10 MG tablet Take 10 mg by mouth every 8 (eight) hours as needed.     gabapentin (NEURONTIN) 300 MG capsule gabapentin 300 mg capsule  PLEASE SEE ATTACHED FOR DETAILED DIRECTIONS     Iron-Vitamin C (VITRON-C) 65-125 MG TABS Take 1 tablet by mouth daily. 90 tablet 1   metFORMIN (GLUCOPHAGE-XR) 500 MG 24 hr tablet Take 500 mg by mouth every morning.     apixaban (ELIQUIS) 2.5 MG TABS tablet Take 1 tablet (2.5 mg total) by mouth 2 (two) times daily. 180 tablet 1   docusate sodium (COLACE) 100 MG capsule Take 1 capsule (100 mg total) by mouth daily. 90 capsule 1   levothyroxine (SYNTHROID) 200 MCG tablet Take 200 mcg by mouth daily.     No current facility-administered medications for this visit.     PHYSICAL  EXAMINATION: ECOG PERFORMANCE STATUS: 1 - Symptomatic but completely ambulatory Vitals:   04/05/22 1311  BP: (!) 167/85  Pulse: 85  Temp: 97.9 F (36.6 C)   Filed Weights   04/05/22 1311  Weight: (!) 436 lb (197.8 kg)    Physical Exam Constitutional:      General: She is not in acute distress.    Appearance: She is obese.  HENT:     Head: Normocephalic and atraumatic.  Eyes:     General: No scleral icterus.    Pupils: Pupils are equal, round, and reactive to light.  Cardiovascular:     Rate and Rhythm: Normal rate and regular rhythm.     Heart sounds: Normal heart sounds.  Pulmonary:     Effort: Pulmonary effort is normal. No respiratory distress.     Breath sounds: No wheezing.     Comments: Decreased breath sound bilaterally Abdominal:     General: Bowel  sounds are normal. There is no distension.     Palpations: Abdomen is soft. There is no mass.     Tenderness: There is no abdominal tenderness.  Musculoskeletal:        General: Swelling present. No deformity. Normal range of motion.     Cervical back: Normal range of motion and neck supple.     Comments: Chronic bilateral lower extremity edema  Skin:    General: Skin is warm and dry.     Findings: No erythema or rash.  Neurological:     Mental Status: She is alert and oriented to person, place, and time.     Cranial Nerves: No cranial nerve deficit.     Coordination: Coordination normal.  Psychiatric:        Behavior: Behavior normal.        Thought Content: Thought content normal.    LABORATORY DATA:  I have reviewed the data as listed Lab Results  Component Value Date   WBC 5.5 03/09/2022   HGB 11.2 (L) 03/09/2022   HCT 36.8 03/09/2022   MCV 83.8 03/09/2022   PLT 287 03/09/2022   Recent Labs    09/04/21 1348  NA 135  K 3.4*  CL 101  CO2 28  GLUCOSE 185*  BUN 9  CREATININE 0.86  CALCIUM 8.1*  GFRNONAA >60  PROT 8.3*  ALBUMIN 3.3*  AST 13*  ALT 14  ALKPHOS 85  BILITOT 0.3     Iron/TIBC/Ferritin/ %Sat    Component Value Date/Time   IRON 31 03/09/2022 1300   TIBC 400 03/09/2022 1300   FERRITIN 9 (L) 03/09/2022 1300   IRONPCTSAT 8 (L) 03/09/2022 1300      RADIOGRAPHIC STUDIES: I have personally reviewed the radiological images as listed and agreed with the findings in the report. MR LUMBAR SPINE WO CONTRAST  Result Date: 02/11/2022 CLINICAL DATA:  Paresthesias.  Pain in the lower extremity. EXAM: MRI LUMBAR SPINE WITHOUT CONTRAST TECHNIQUE: Multiplanar, multisequence MR imaging of the lumbar spine was performed. No intravenous contrast was administered. COMPARISON:  Lumbar spine radiographs 08/21/2018 FINDINGS: Segmentation: 5 non rib-bearing lumbar type vertebral bodies are present. The lowest fully formed vertebral body is L5. Alignment: No significant listhesis is present. Lumbar lordosis is within normal limits. Vertebrae: Marrow signal and vertebral body heights are within normal limits. Conus medullaris and cauda equina: Conus extends to the L1-2 level. Conus and cauda equina appear normal. Paraspinal and other soft tissues: Limited imaging the abdomen is unremarkable. There is no significant adenopathy. No solid organ lesions are present. Mild atrophy of paraspinous musculature noted. Disc levels: L1-2: Negative. L2-3: Negative. L3-4: Negative. L4-5: Mild disc bulging is present. Asymmetric right-sided facet hypertrophy is present. No significant stenosis is present. L5-S1: A shallow central disc protrusion is present. Mild facet hypertrophy is noted bilaterally. Mild central and bilateral foraminal narrowing is present. IMPRESSION: 1. Mild central and bilateral foraminal narrowing at L5-S1 secondary to a shallow central disc protrusion and bilateral facet hypertrophy. 2. Mild disc bulging and asymmetric right-sided facet hypertrophy at L4-5 without significant stenosis. Electronically Signed   By: San Morelle M.D.   On: 02/11/2022 09:25   US Venous Img  Lower Unilateral Right  Result Date: 01/07/2022 CLINICAL DATA:  Leg pain since 12/31/2021 EXAM: RIGHT LOWER EXTREMITY VENOUS DOPPLER ULTRASOUND TECHNIQUE: Gray-scale sonography with compression, as well as color and duplex ultrasound, were performed to evaluate the deep venous system(s) from the level of the common femoral vein through the popliteal and proximal  calf veins. COMPARISON:  None. FINDINGS: VENOUS Normal compressibility of the common femoral, superficial femoral, and popliteal veins, as well as the visualized calf veins. Visualized portions of profunda femoral vein and great saphenous vein unremarkable. No filling defects to suggest DVT on grayscale or color Doppler imaging. Doppler waveforms show normal direction of venous flow, normal respiratory plasticity and response to augmentation. Limited views of the contralateral common femoral vein are unremarkable. OTHER None. Limitations: none IMPRESSION: No right lower extremity VT. Electronically Signed   By: Miachel Roux M.D.   On: 01/07/2022 11:36   DG Knee Complete 4 Views Right  Result Date: 01/07/2022 CLINICAL DATA:  Knee pain EXAM: RIGHT KNEE - COMPLETE 4+ VIEW COMPARISON:  None. FINDINGS: No evidence of acute fracture or joint malalignment. Probable small joint effusion. Mild medial compartment joint space loss. Patellar enthesophytes. IMPRESSION: No evidence of acute fracture or joint malalignment. Electronically Signed   By: Margaretha Sheffield M.D.   On: 01/07/2022 13:03     ASSESSMENT & PLAN:  1. Iron deficiency anemia due to chronic blood loss   2. History of DVT (deep vein thrombosis)    # History of PE and DVT Continue Eliquis 2.5 mg twice daily.  Refill prescription was sent to pharmacy.   # Iron deficiency anemia Labs are reviewed and discussed with patient.  Her hemoglobin has improved to 11.2, iron saturation showed saturation of 8, ferritin of 9.  Consistent with iron deficiency.  We have previously discussed about IV  Venofer treatments as an option for her.  Patient prefers to take oral iron supplementation. I encourage patient to take ferrous sulfate 325 mg twice daily.  She uses over-the-counter supply.  Orders Placed This Encounter  Procedures   CBC with Differential/Platelet    Standing Status:   Future    Standing Expiration Date:   04/06/2023   Ferritin    Standing Status:   Future    Standing Expiration Date:   04/06/2023   Iron and TIBC    Standing Status:   Future    Standing Expiration Date:   04/06/2023    All questions were answered. The patient knows to call the clinic with any problems questions or concerns.  cc No ref. provider found    Return of visit: Virtual visit in 6 months.  Earlie Server, MD, PhD Hematology Oncology  04/05/2022

## 2022-10-11 ENCOUNTER — Inpatient Hospital Stay: Payer: Medicaid Other | Attending: Oncology

## 2022-10-11 DIAGNOSIS — Z87891 Personal history of nicotine dependence: Secondary | ICD-10-CM | POA: Diagnosis not present

## 2022-10-11 DIAGNOSIS — D509 Iron deficiency anemia, unspecified: Secondary | ICD-10-CM | POA: Diagnosis not present

## 2022-10-11 DIAGNOSIS — Z7901 Long term (current) use of anticoagulants: Secondary | ICD-10-CM | POA: Diagnosis not present

## 2022-10-11 DIAGNOSIS — D500 Iron deficiency anemia secondary to blood loss (chronic): Secondary | ICD-10-CM

## 2022-10-11 DIAGNOSIS — Z86718 Personal history of other venous thrombosis and embolism: Secondary | ICD-10-CM | POA: Insufficient documentation

## 2022-10-11 DIAGNOSIS — R600 Localized edema: Secondary | ICD-10-CM | POA: Insufficient documentation

## 2022-10-11 LAB — CBC WITH DIFFERENTIAL/PLATELET
Abs Immature Granulocytes: 0.02 10*3/uL (ref 0.00–0.07)
Basophils Absolute: 0 10*3/uL (ref 0.0–0.1)
Basophils Relative: 1 %
Eosinophils Absolute: 0.2 10*3/uL (ref 0.0–0.5)
Eosinophils Relative: 3 %
HCT: 37.1 % (ref 36.0–46.0)
Hemoglobin: 11.3 g/dL — ABNORMAL LOW (ref 12.0–15.0)
Immature Granulocytes: 0 %
Lymphocytes Relative: 29 %
Lymphs Abs: 1.6 10*3/uL (ref 0.7–4.0)
MCH: 25.6 pg — ABNORMAL LOW (ref 26.0–34.0)
MCHC: 30.5 g/dL (ref 30.0–36.0)
MCV: 84.1 fL (ref 80.0–100.0)
Monocytes Absolute: 0.4 10*3/uL (ref 0.1–1.0)
Monocytes Relative: 7 %
Neutro Abs: 3.3 10*3/uL (ref 1.7–7.7)
Neutrophils Relative %: 60 %
Platelets: 314 10*3/uL (ref 150–400)
RBC: 4.41 MIL/uL (ref 3.87–5.11)
RDW: 14.5 % (ref 11.5–15.5)
WBC: 5.5 10*3/uL (ref 4.0–10.5)
nRBC: 0 % (ref 0.0–0.2)

## 2022-10-11 LAB — IRON AND TIBC
Iron: 35 ug/dL (ref 28–170)
Saturation Ratios: 9 % — ABNORMAL LOW (ref 10.4–31.8)
TIBC: 379 ug/dL (ref 250–450)
UIBC: 344 ug/dL

## 2022-10-11 LAB — FERRITIN: Ferritin: 10 ng/mL — ABNORMAL LOW (ref 11–307)

## 2022-10-13 ENCOUNTER — Encounter: Payer: Self-pay | Admitting: Oncology

## 2022-10-13 ENCOUNTER — Inpatient Hospital Stay (HOSPITAL_BASED_OUTPATIENT_CLINIC_OR_DEPARTMENT_OTHER): Payer: Medicaid Other | Admitting: Oncology

## 2022-10-13 DIAGNOSIS — Z86711 Personal history of pulmonary embolism: Secondary | ICD-10-CM | POA: Insufficient documentation

## 2022-10-13 DIAGNOSIS — D500 Iron deficiency anemia secondary to blood loss (chronic): Secondary | ICD-10-CM | POA: Insufficient documentation

## 2022-10-13 NOTE — Progress Notes (Signed)
Hematology/Oncology Progress note [phone] [phone]      Patient Care Team: System, Provider Not In as PCP - General Alleen Borne, MD as Attending Physician (Cardiothoracic Surgery) Jolaine Click, MD (Inactive) as Attending Physician (Radiology) Rickard Patience, MD as Consulting Physician (Hematology and Oncology)  REFERRING PROVIDER: No ref. provider found  CHIEF COMPLAINTS/REASON FOR VISIT:   DVT/PE, iron deficiency anemia.  HISTORY OF PRESENTING ILLNESS:   Kimberly Nolan is a  36 y.o.  female with PMH listed below was seen in consultation at the request of  No ref. provider found  for evaluation of DVT/PE. Patient presented to ED on 10/18/2019 due increased cramping pain of left lower extremity and left before ultrasound result did.  Patient was notified about DVT results which prompted her to return to ER for evaluation of leg cramping and progressively worsening shortness of breath.  Patient endorses chronic bilateral lower extremity edema, shortness of breath with exertion. Lower extremity venous Doppler ultrasound showed DVT within the left popliteal vein. CT angiogram of the chest showed bilateral pulmonary emboli in all lobes of both lungs and the findings suggestive of RV strain.  Patient was admitted and anticoagulation with heparin was given.  Patient was discharged on Xarelto starting.  Patient currently is on Xarelto 15 mg twice daily. There was no immobilization factors which may contribute to her events. Patient has morbid obesity and has sedentary lifestyle. Patient recently stopped smoking. Today she reports that left lower extremity cramp has improved.  Continues to have bilateral lower extremity edema which is chronic for her.  Shortness of breath has improved.  Denies any bleeding events.  INTERVAL HISTORY Kimberly Nolan is a 36 y.o. female who has above history reviewed by me today presents for follow up visit for management of history of  DVT/PE, iron deficiency anemia Patient is currently taking Eliquis 2.5 mg twice daily.  Overall she tolerates well.  Denies any active bleeding events. Patient has iron deficiency anemia, she takes see oral iron supplementation..  She uses over-the-counter supply. Today she feels well.  + fatigue.  Appetite  Review of Systems  Constitutional:  Positive for fatigue. Negative for appetite change, chills and fever.  HENT:   Negative for hearing loss and voice change.   Eyes:  Negative for eye problems.  Respiratory:  Negative for chest tightness and cough.   Cardiovascular:  Negative for chest pain.  Gastrointestinal:  Negative for abdominal distention, abdominal pain and blood in stool.  Endocrine: Negative for hot flashes.  Genitourinary:  Negative for difficulty urinating and frequency.   Musculoskeletal:  Negative for arthralgias.  Skin:  Negative for itching and rash.  Neurological:  Negative for extremity weakness.  Hematological:  Negative for adenopathy.  Psychiatric/Behavioral:  Negative for confusion.     MEDICAL HISTORY:  Past Medical History:  Diagnosis Date   Anginal pain ([facility])    Dyspnea    Essential hypertension    Family history of adverse reaction to anesthesia    Mother slow to wake up after surgery   Graves disease    Lower extremity edema    Morbid obesity ([facility])    Smoker     SURGICAL HISTORY: Past Surgical History:  Procedure Laterality Date   CESAREAN SECTION     PARATHYROIDECTOMY  06/27/2019   Procedure: PARATHYROIDECTOMY AUTOTRANSPLANT;  Surgeon: Duanne Guess, MD;  Location: [facility];  Service: General;;   THYROIDECTOMY N/A 06/27/2019   Procedure: TOTAL THYROIDECTOMY ;  Surgeon: Duanne Guess, MD;  Location: [facility];  Service: General;  Laterality: N/A;    SOCIAL HISTORY: Social History   Socioeconomic History   Marital status: Single    Spouse name: Not on file   Number of children: Not on file   Years of education: Not on file    Highest education level: Not on file  Occupational History   Not on file  Tobacco Use   Smoking status: Former    Packs/day: 0.50    Years: 3.00    Total pack years: 1.50    Types: Cigarettes    Quit date: 10/16/2019    Years since quitting: 2.9   Smokeless tobacco: Never  Vaping Use   Vaping Use: Never used  Substance and Sexual Activity   Alcohol use: Not Currently    Comment: Occasionally   Drug use: No   Sexual activity: Yes    Partners: Male  Other Topics Concern   Not on file  Social History Narrative   Not on file   Social Determinants of Health   Financial Resource Strain: Unknown (06/27/2019)   Overall Financial Resource Strain (CARDIA)    Difficulty of Paying Living Expenses: Patient refused  Food Insecurity: Unknown (06/27/2019)   Hunger Vital Sign    Worried About Running Out of Food in the Last Year: Patient refused    Ran Out of Food in the Last Year: Patient refused  Transportation Needs: Not on file  Physical Activity: Unknown (06/27/2019)   Exercise Vital Sign    Days of Exercise per Week: Patient refused    Minutes of Exercise per Session: Patient refused  Stress: Unknown (06/27/2019)   [employer] of Occupational Health - Occupational Stress Questionnaire    Feeling of Stress : Patient refused  Social Connections: Unknown (06/27/2019)   Social Connection and Isolation Panel [NHANES]    Frequency of Communication with Friends and Family: Patient refused    Frequency of Social Gatherings with Friends and Family: Patient refused    Attends Religious Services: Patient refused    Active Member of Clubs or Organizations: Patient refused    Attends Banker Meetings: Patient refused    Marital Status: Patient refused  Intimate Partner Violence: Unknown (06/27/2019)   Humiliation, Afraid, Rape, and Kick questionnaire    Fear of Current or Ex-Partner: Not asked    Emotionally Abused: Not asked    Physically Abused: Not asked    Sexually  Abused: Not asked    FAMILY HISTORY: Family History  Problem Relation Age of Onset   Thyroid disease Mother    Hypertension Father    Diabetes Brother    Hypertension Brother    Brain cancer Maternal Aunt    Prostate cancer Maternal Grandfather     ALLERGIES:  is allergic to penicillins.  MEDICATIONS:  Current Outpatient Medications  Medication Sig Dispense Refill   apixaban (ELIQUIS) 2.5 MG TABS tablet Take 1 tablet (2.5 mg total) by mouth 2 (two) times daily. 180 tablet 1   baclofen (LIORESAL) 10 MG tablet Take 10 mg by mouth every 8 (eight) hours as needed.     gabapentin (NEURONTIN) 300 MG capsule gabapentin 300 mg capsule  PLEASE SEE ATTACHED FOR DETAILED DIRECTIONS     Iron-Vitamin C (VITRON-C) 65-125 MG TABS Take 1 tablet by mouth daily. 90 tablet 1   levothyroxine (SYNTHROID) 200 MCG tablet Take 200 mcg by mouth daily.     metFORMIN (GLUCOPHAGE-XR) 500 MG 24 hr tablet Take 500 mg by mouth every morning.  OZEMPIC, 0.25 OR 0.5 MG/DOSE, 2 MG/3ML SOPN Inject 0.5 mg into the skin once a week.     No current facility-administered medications for this visit.     PHYSICAL EXAMINATION: ECOG PERFORMANCE STATUS: 1 - Symptomatic but completely ambulatory There were no vitals filed for this visit.  Physical Exam Neurological:     Mental Status: She is alert.     LABORATORY DATA:  I have reviewed the data as listed Lab Results  Component Value Date   WBC 5.5 10/11/2022   HGB 11.3 (L) 10/11/2022   HCT 37.1 10/11/2022   MCV 84.1 10/11/2022   PLT 314 10/11/2022   No results for input(s): "NA", "K", "CL", "CO2", "GLUCOSE", "BUN", "CREATININE", "CALCIUM", "GFRNONAA", "GFRAA", "PROT", "ALBUMIN", "AST", "ALT", "ALKPHOS", "BILITOT", "BILIDIR", "IBILI" in the last 8760 hours.  Iron/TIBC/Ferritin/ %Sat    Component Value Date/Time   IRON 35 10/11/2022 1334   TIBC 379 10/11/2022 1334   FERRITIN 10 (L) 10/11/2022 1334   IRONPCTSAT 9 (L) 10/11/2022 1334       RADIOGRAPHIC STUDIES: I have personally reviewed the radiological images as listed and agreed with the findings in the report. No results found.   ASSESSMENT & PLAN:   Iron deficiency anemia due to chronic blood loss Labs are reviewed and discussed with patient.  Low iron level consistent with iron deficiency anemia. Patient is interested in IV venofer.  I discussed about the potential risks including but not limited to allergic reactions/infusion reactions including anaphylactic reactions, phlebitis, high blood pressure, wheezing, SOB, skin rash, weight gain, leg swelling, headache, nausea and fatigue, etc. Patient tolerates oral iron supplement poorly and desires to achieved higher level of iron faster for adequate hematopoesis. Plan IV venofer weekly x 4   History of pulmonary embolism # History of PE and DVT Continue Eliquis 2.5 mg twice daily.   Orders Placed This Encounter  Procedures   Pregnancy, urine    Standing Status:   Standing    Number of Occurrences:   4    Standing Expiration Date:   10/14/2023   Pregnancy, urine    Standing Status:   Future    Standing Expiration Date:   10/14/2023   CBC with Differential/Platelet    Standing Status:   Future    Standing Expiration Date:   10/14/2023   Ferritin    Standing Status:   Future    Standing Expiration Date:   10/14/2023   Iron and TIBC    Standing Status:   Future    Standing Expiration Date:   10/14/2023   Follow up in 6 months. All questions were answered. The patient knows to call the clinic with any problems, questions or concerns.  Rickard Patience, MD, PhD [facility] Hematology Oncology 10/13/2022

## 2022-10-13 NOTE — Assessment & Plan Note (Signed)
#   History of PE and DVT Continue Eliquis 2.5 mg twice daily.

## 2022-10-13 NOTE — Assessment & Plan Note (Signed)
Labs are reviewed and discussed with patient.  Low iron level consistent with iron deficiency anemia. Patient is interested in IV venofer.  I discussed about the potential risks including but not limited to allergic reactions/infusion reactions including anaphylactic reactions, phlebitis, high blood pressure, wheezing, SOB, skin rash, weight gain, leg swelling, headache, nausea and fatigue, etc. Patient tolerates oral iron supplement poorly and desires to achieved higher level of iron faster for adequate hematopoesis. Plan IV venofer weekly x 4

## 2022-10-13 NOTE — Progress Notes (Signed)
Pt contacted for Mychart visit. No new concerns voiced.  

## 2022-10-14 ENCOUNTER — Inpatient Hospital Stay: Payer: Medicaid Other | Admitting: Oncology

## 2022-11-23 ENCOUNTER — Ambulatory Visit
Admission: RE | Admit: 2022-11-23 | Discharge: 2022-11-23 | Disposition: A | Discharge status: Home / Self Care | Payer: Medicaid Other | Source: Ambulatory Visit | Attending: Emergency Medicine | Admitting: Emergency Medicine

## 2022-11-23 VITALS — BP 139/85 | HR 95 | Temp 98.2°F | Resp 18 | Ht 67.0 in | Wt >= 6400 oz

## 2022-11-23 DIAGNOSIS — J0100 Acute maxillary sinusitis, unspecified: Secondary | ICD-10-CM | POA: Diagnosis not present

## 2022-11-23 DIAGNOSIS — H1033 Unspecified acute conjunctivitis, bilateral: Secondary | ICD-10-CM | POA: Diagnosis not present

## 2022-11-23 MED ORDER — AZITHROMYCIN 250 MG PO TABS: 250.0000 mg | ORAL_TABLET | Freq: Every day | ORAL | 0 refills | Status: DC

## 2022-11-23 MED ORDER — POLYMYXIN B-TRIMETHOPRIM 10000-0.1 UNIT/ML-% OP SOLN: 1.0000 [drp] | Freq: Four times a day (QID) | OPHTHALMIC | 0 refills | Status: AC

## 2022-11-23 NOTE — Discharge Instructions (Addendum)
Take the Zithromax and use the eye drops as directed.  Follow up with your primary care provider if your symptoms are not improving.    

## 2022-11-23 NOTE — ED Triage Notes (Signed)
Patient to Urgent Care with complaints of productive cough, eye pain/ redness, nasal congestion, left sided ear fullness, right ear pain. Hoarseness.   Reports she has been sick with a cold for approx 3 weeks. Eye symptoms started this week.   Has tried multiple otc medications for cold/flu.   Denies any known fevers.

## 2022-11-23 NOTE — ED Provider Notes (Signed)
Kimberly Nolan    CSN: [id number] Arrival date & time: 11/23/22  1513      History   Chief Complaint Chief Complaint  Patient presents with   Ear Fullness    Coughing congestion eye pain and redness.. I have been sick with a cold for about 3 weeks - Entered by patient    HPI Kimberly Nolan is a 37 y.o. female.  Patient presents with 3 week history of ear pain, congestion, cough.  She reports 2 day history of bilateral eye redness, green eye drainage, and itching.  No eye pain, eye trauma, change in vision.  No fever, shortness of breath, chest pain, or other symptoms.  Treating symptoms with OTC cold medication.  Her medical history includes hypertension, pulmonary embolism, DVT, Graves disease, chronic low back pain, depression, morbid obesity, current everyday smoker.    The history is provided by the patient and medical records.    Past Medical History:  Diagnosis Date   Anginal pain (HCC)    Dyspnea    Essential hypertension    Family history of adverse reaction to anesthesia    Mother slow to wake up after surgery   Graves disease    Lower extremity edema    Morbid obesity (HCC)    Smoker     Patient Active Problem List   Diagnosis Date Noted   Iron deficiency anemia due to chronic blood loss 10/13/2022   History of pulmonary embolism 10/13/2022   Normocytic anemia 01/18/2020   Pulmonary embolism (HCC) 10/19/2019   DVT (deep venous thrombosis) (HCC) 10/19/2019   Do not resuscitate status 10/19/2019   Pulmonary emboli (HCC) 10/19/2019   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   Preop cardiovascular exam 06/14/2019   Tobacco dependence 03/02/2019   Graves' disease 03/02/2019   Low back pain 03/29/2018   Lumbar radiculopathy 03/29/2018   Thyroid storm 02/17/2017   Thyroiditis 02/17/2017   Goiter 02/17/2017   Hyperglycemia 02/17/2017   Prediabetes 02/17/2017   Morbid obesity (HCC) 02/17/2017   Hyperthyroidism  02/16/2017   Graves disease 02/16/2017   Essential hypertension 02/16/2017   Current every day smoker 02/27/2015   Morbid obesity with BMI of 50.0-59.9, adult (HCC) 02/27/2015   Shortness of breath 09/13/2012   CAP (community acquired pneumonia) 09/13/2012   Mediastinal mass 09/13/2012   Leukocytosis 09/13/2012   Palpitations 09/13/2012    Past Surgical History:  Procedure Laterality Date   CESAREAN SECTION     PARATHYROIDECTOMY  06/27/2019   Procedure: PARATHYROIDECTOMY AUTOTRANSPLANT;  Surgeon: Duanne Guess, MD;  Location: [facility] ORS;  Service: General;;   THYROIDECTOMY N/A 06/27/2019   Procedure: TOTAL THYROIDECTOMY ;  Surgeon: Duanne Guess, MD;  Location: [facility] ORS;  Service: General;  Laterality: N/A;    OB History   No obstetric history on file.      Home Medications    Prior to Admission medications   Medication Sig Start Date End Date Taking? Authorizing Provider  azithromycin (ZITHROMAX) 250 MG tablet Take 1 tablet (250 mg total) by mouth daily. Take first 2 tablets together, then 1 every day until finished. 11/23/22  Yes Mickie Bail, NP  trimethoprim-polymyxin b (POLYTRIM) ophthalmic solution Place 1 drop into both eyes 4 (four) times daily for 7 days. 11/23/22 11/30/22 Yes Mickie Bail, NP  apixaban (ELIQUIS) 2.5 MG TABS tablet Take 1 tablet (2.5 mg total) by mouth 2 (two) times daily. 04/05/22   Rickard Patience, MD  baclofen (LIORESAL) 10 MG tablet  Take 10 mg by mouth every 8 (eight) hours as needed. 10/03/19   [provider]  gabapentin (NEURONTIN) 300 MG capsule gabapentin 300 mg capsule  PLEASE SEE ATTACHED FOR DETAILED DIRECTIONS 12/06/19   [provider]  Iron-Vitamin C (VITRON-C) 65-125 MG TABS Take 1 tablet by mouth daily. 03/06/21   Earlie Server, MD  levothyroxine (SYNTHROID) 200 MCG tablet Take 200 mcg by mouth daily. 09/26/19 10/13/22  [provider]  metFORMIN (GLUCOPHAGE-XR) 500 MG 24 hr tablet Take 500 mg by mouth every morning. 07/09/21    [provider]  OZEMPIC, 0.25 OR 0.5 MG/DOSE, 2 MG/3ML SOPN Inject 0.5 mg into the skin once a week.    [provider]    Family History Family History  Problem Relation Age of Onset   Thyroid disease Mother    Hypertension Father    Diabetes Brother    Hypertension Brother    Brain cancer Maternal Aunt    Prostate cancer Maternal Grandfather     Social History Social History   Tobacco Use   Smoking status: Former    Packs/day: 0.50    Years: 3.00    Total pack years: 1.50    Types: Cigarettes    Quit date: 10/16/2019    Years since quitting: 3.1   Smokeless tobacco: Never  Vaping Use   Vaping Use: Never used  Substance Use Topics   Alcohol use: Not Currently    Comment: Occasionally   Drug use: No     Allergies   Penicillins   Review of Systems Review of Systems  Constitutional:  Negative for chills and fever.  HENT:  Positive for congestion and ear pain. Negative for sore throat.   Eyes:  Positive for discharge, redness and itching. Negative for pain and visual disturbance.  Respiratory:  Positive for cough. Negative for shortness of breath.   Cardiovascular:  Negative for chest pain and palpitations.  Gastrointestinal:  Negative for diarrhea and vomiting.  Skin:  Negative for color change and rash.  All other systems reviewed and are negative.    Physical Exam Triage Vital Signs ED Triage Vitals  Enc Vitals Group     BP      Pulse      Resp      Temp      Temp src      SpO2      Weight      Height      Head Circumference      Peak Flow      Pain Score      Pain Loc      Pain Edu?      Excl. in [place]?    No data found.  Updated Vital Signs BP 139/85   Pulse 95   Temp 98.2 F (36.8 C)   Resp 18   Ht 5\' 7"  (1.702 m)   Wt (!) 440 lb (199.6 kg)   LMP 10/28/2022   SpO2 96%   BMI 68.91 kg/m   Visual Acuity Right Eye Distance:   Left Eye Distance:   Bilateral Distance:    Right Eye Near:   Left Eye Near:     Bilateral Near:     Physical Exam Vitals and nursing note reviewed.  Constitutional:      General: She is not in acute distress.    Appearance: She is well-developed. She is obese. She is not ill-appearing.  HENT:     Right Ear: Tympanic membrane normal.  Left Ear: Tympanic membrane normal.     Nose: Congestion present.     Mouth/Throat:     Mouth: Mucous membranes are moist.     Pharynx: Oropharynx is clear.  Eyes:     General: Lids are normal. Vision grossly intact.     Extraocular Movements: Extraocular movements intact.     Conjunctiva/sclera:     Right eye: Right conjunctiva is injected.     Left eye: Left conjunctiva is injected.     Pupils: Pupils are equal, round, and reactive to light.     Comments: Bilateral conjunctiva injected; green crusting in lashes.    Cardiovascular:     Rate and Rhythm: Normal rate and regular rhythm.     Heart sounds: Normal heart sounds.  Pulmonary:     Effort: Pulmonary effort is normal. No respiratory distress.     Breath sounds: Normal breath sounds.  Musculoskeletal:     Cervical back: Neck supple.  Skin:    General: Skin is warm and dry.  Neurological:     Mental Status: She is alert.  Psychiatric:        Mood and Affect: Mood normal.        Behavior: Behavior normal.      UC Treatments / Results  Labs (all labs ordered are listed, but only abnormal results are displayed) Labs Reviewed - No data to display  EKG   Radiology No results found.  Procedures Procedures (including critical care time)  Medications Ordered in UC Medications - No data to display  Initial Impression / Assessment and Plan / UC Course  I have reviewed the triage vital signs and the nursing notes.  Pertinent labs & imaging results that were available during my care of the patient were reviewed by me and considered in my medical decision making (see chart for details).   Acute bilateral conjunctivitis, acute sinusitis.  Afebrile, VSS.   Treating with Polytrim eyedrops.  Education provided on conjunctivitis.  Instructed patient to follow-up with her PCP if her symptoms are not improving.  ED precautions discussed.  Treating sinus infection with Zithromax as patient is allergic to PCN and reports this medication has worked well in the past.  Education provided on sinus infection.  Patient agrees to plan of care.    Final Clinical Impressions(s) / UC Diagnoses   Final diagnoses:  Acute bacterial conjunctivitis of both eyes  Acute non-recurrent maxillary sinusitis     Discharge Instructions      Take the Zithromax and use the eye drops as directed.  Follow up with your primary care provider if your symptoms are not improving.        ED Prescriptions     Medication Sig Dispense Auth. Provider   trimethoprim-polymyxin b (POLYTRIM) ophthalmic solution Place 1 drop into both eyes 4 (four) times daily for 7 days. 10 mL Mickie Bail, NP   azithromycin (ZITHROMAX) 250 MG tablet Take 1 tablet (250 mg total) by mouth daily. Take first 2 tablets together, then 1 every day until finished. 6 tablet Mickie Bail, NP      I have reviewed the PDMP during this encounter.   Mickie Bail, NP 11/23/22 1606

## 2023-05-29 ENCOUNTER — Encounter: Payer: Self-pay | Admitting: Intensive Care

## 2023-05-29 ENCOUNTER — Emergency Department
Admission: EM | Admit: 2023-05-29 | Discharge: 2023-05-29 | Disposition: A | Discharge status: Home / Self Care | Payer: 59 | Attending: Emergency Medicine | Admitting: Emergency Medicine

## 2023-05-29 ENCOUNTER — Emergency Department: Payer: 59

## 2023-05-29 ENCOUNTER — Other Ambulatory Visit: Payer: Self-pay

## 2023-05-29 DIAGNOSIS — K529 Noninfective gastroenteritis and colitis, unspecified: Secondary | ICD-10-CM | POA: Diagnosis not present

## 2023-05-29 DIAGNOSIS — R109 Unspecified abdominal pain: Secondary | ICD-10-CM | POA: Diagnosis not present

## 2023-05-29 DIAGNOSIS — I10 Essential (primary) hypertension: Secondary | ICD-10-CM | POA: Diagnosis not present

## 2023-05-29 DIAGNOSIS — R1013 Epigastric pain: Secondary | ICD-10-CM | POA: Diagnosis not present

## 2023-05-29 HISTORY — DX: Type 2 diabetes mellitus without complications: E11.9

## 2023-05-29 LAB — CBC
HCT: 38.3 % (ref 36.0–46.0)
Hemoglobin: 11.7 g/dL — ABNORMAL LOW (ref 12.0–15.0)
MCH: 25.9 pg — ABNORMAL LOW (ref 26.0–34.0)
MCHC: 30.5 g/dL (ref 30.0–36.0)
MCV: 84.7 fL (ref 80.0–100.0)
Platelets: 303 10*3/uL (ref 150–400)
RBC: 4.52 MIL/uL (ref 3.87–5.11)
RDW: 14.6 % (ref 11.5–15.5)
WBC: 3.6 10*3/uL — ABNORMAL LOW (ref 4.0–10.5)
nRBC: 0 % (ref 0.0–0.2)

## 2023-05-29 LAB — URINALYSIS, ROUTINE W REFLEX MICROSCOPIC
Bacteria, UA: NONE SEEN
Bilirubin Urine: NEGATIVE
Glucose, UA: NEGATIVE mg/dL
Hgb urine dipstick: NEGATIVE
Ketones, ur: 5 mg/dL — AB
Leukocytes,Ua: NEGATIVE
Nitrite: NEGATIVE
Protein, ur: 30 mg/dL — AB
Specific Gravity, Urine: 1.027 (ref 1.005–1.030)
pH: 6 (ref 5.0–8.0)

## 2023-05-29 LAB — COMPREHENSIVE METABOLIC PANEL
ALT: 15 U/L (ref 0–44)
AST: 15 U/L (ref 15–41)
Albumin: 3.4 g/dL — ABNORMAL LOW (ref 3.5–5.0)
Alkaline Phosphatase: 89 U/L (ref 38–126)
Anion gap: 9 (ref 5–15)
BUN: 9 mg/dL (ref 6–20)
CO2: 27 mmol/L (ref 22–32)
Calcium: 8.2 mg/dL — ABNORMAL LOW (ref 8.9–10.3)
Chloride: 101 mmol/L (ref 98–111)
Creatinine, Ser: 0.87 mg/dL (ref 0.44–1.00)
GFR, Estimated: >60 mL/min (ref >60)
Glucose, Bld: 130 mg/dL — ABNORMAL HIGH (ref 70–99)
Potassium: 3.5 mmol/L (ref 3.5–5.1)
Sodium: 137 mmol/L (ref 135–145)
Total Bilirubin: 0.5 mg/dL (ref 0.3–1.2)
Total Protein: 8.4 g/dL — ABNORMAL HIGH (ref 6.5–8.1)

## 2023-05-29 LAB — POC URINE PREG, ED: Preg Test, Ur: NEGATIVE

## 2023-05-29 LAB — LIPASE, BLOOD: Lipase: 32 U/L (ref 11–51)

## 2023-05-29 MED ORDER — ONDANSETRON 4 MG PO TBDP: 4.0000 mg | ORAL_TABLET | Freq: Three times a day (TID) | ORAL | 0 refills | Status: AC | PRN

## 2023-05-29 MED ORDER — DICYCLOMINE HCL 10 MG PO CAPS: 10.0000 mg | ORAL_CAPSULE | Freq: Three times a day (TID) | ORAL | 0 refills | Status: AC

## 2023-05-29 MED ORDER — IOHEXOL 300 MG/ML  SOLN
100.0000 mL | Freq: Once | INTRAMUSCULAR | Status: AC | PRN
  Administered 2023-05-29: 100 mL via INTRAVENOUS

## 2023-05-29 MED ORDER — ONDANSETRON HCL 4 MG/2ML IJ SOLN
4.0000 mg | Freq: Once | INTRAMUSCULAR | Status: AC
  Administered 2023-05-29: 4 mg via INTRAVENOUS
  Filled 2023-05-29: qty 2

## 2023-05-29 MED ORDER — DICYCLOMINE HCL 10 MG PO CAPS
10.0000 mg | ORAL_CAPSULE | Freq: Once | ORAL | Status: AC
  Administered 2023-05-29: 10 mg via ORAL
  Filled 2023-05-29: qty 1

## 2023-05-29 NOTE — ED Triage Notes (Signed)
Pt in via EMS from home with c/o intermittent abd pain for the last 2 days. Pt tender with palpation. 10/10 all the way down. Last BM was 2 days ago as well. Pt is nauseated as well. CBG 122. Pt also with decreased appetite

## 2023-05-29 NOTE — ED Provider Notes (Signed)
[facility] Provider Note  Patient Contact: 6:45 PM (approximate)   History   Abdominal Pain   HPI  Kimberly Nolan is a 37 y.o. female who presents emergency department complaining of 2 to 3 days worth of epigastric abdominal pain, nausea vomiting.  Patient states that initially she felt like she had gotten some food poisoning, thought that it would resolve within 24 hours and it has not.  Patient still has all of her organs including gallbladder, appendix.  There is no been bilious vomit.  Patient denies any hematic emesis.  Patient denies any diarrhea, constipation.  No urinary changes, dysuria, polyuria or hematuria.  No chronic GI complaints according to the patient.  Patient has not tried medications prior to arrival.     Physical Exam   Triage Vital Signs: ED Triage Vitals  Encounter Vitals Group     BP 05/29/23 1617 (!) 144/84     Systolic BP Percentile --      Diastolic BP Percentile --      Pulse Rate 05/29/23 1617 74     Resp 05/29/23 1617 16     Temp 05/29/23 1617 97.8 F (36.6 C)     Temp Source 05/29/23 1617 Oral     SpO2 05/29/23 1617 98 %     Weight 05/29/23 1614 (!) 425 lb (192.8 kg)     Height 05/29/23 1614 5\' 7"  (1.702 m)     Head Circumference --      Peak Flow --      Pain Score 05/29/23 1614 0     Pain Loc --      Pain Education --      Exclude from Growth Chart --     Most recent vital signs: Vitals:   05/29/23 1617 05/29/23 1930  BP: (!) 144/84 (!) 141/71  Pulse: 74 (!) 58  Resp: 16 18  Temp: 97.8 F (36.6 C)   SpO2: 98% 97%     General: Alert and in no acute distress.  Cardiovascular:  Good peripheral perfusion Respiratory: Normal respiratory effort without tachypnea or retractions. Lungs CTAB. Good air entry to the bases with no decreased or absent breath sounds. Gastrointestinal: Bowel sounds 4 quadrants. Soft to palpation.  Tender in the epigastric region without palpable abnormality.  No other significant  tenderness to palpation of the abdomen.. No guarding or rigidity. No palpable masses. No distention. No CVA tenderness. Musculoskeletal: Full range of motion to all extremities.  Neurologic:  No gross focal neurologic deficits are appreciated.  Skin:   No rash noted Other:   ED Results / Procedures / Treatments   Labs (all labs ordered are listed, but only abnormal results are displayed) Labs Reviewed  COMPREHENSIVE METABOLIC PANEL - Abnormal; Notable for the following components:      Result Value   Glucose, Bld 130 (*)    Calcium 8.2 (*)    Total Protein 8.4 (*)    Albumin 3.4 (*)    All other components within normal limits  CBC - Abnormal; Notable for the following components:   WBC 3.6 (*)    Hemoglobin 11.7 (*)    MCH 25.9 (*)    All other components within normal limits  URINALYSIS, ROUTINE W REFLEX MICROSCOPIC - Abnormal; Notable for the following components:   Color, Urine YELLOW (*)    APPearance CLEAR (*)    Ketones, ur 5 (*)    Protein, ur 30 (*)    All other components within normal  limits  POC URINE PREG, ED - Normal  LIPASE, BLOOD     EKG     RADIOLOGY  I personally viewed, evaluated, and interpreted these images as part of my medical decision making, as well as reviewing the written report by the radiologist.  ED Provider Interpretation: No acute findings on CT scan.  CT ABDOMEN PELVIS W CONTRAST  Result Date: 05/29/2023 CLINICAL DATA:  Abdominal pain and nausea and decreased appetite EXAM: CT ABDOMEN AND PELVIS WITH CONTRAST TECHNIQUE: Multidetector CT imaging of the abdomen and pelvis was performed using the standard protocol following bolus administration of intravenous contrast. RADIATION DOSE REDUCTION: This exam was performed according to the departmental dose-optimization program which includes automated exposure control, adjustment of the mA and/or kV according to patient size and/or use of iterative reconstruction technique. CONTRAST:   OMNIPAQUE IOHEXOL 300 MG/ML  SOLN COMPARISON:  None Available. FINDINGS: Lower chest: No acute abnormality. Hepatobiliary: Unremarkable liver. Normal gallbladder. No biliary dilation. Pancreas: Unremarkable. Spleen: Unremarkable. Adrenals/Urinary Tract: Normal adrenal glands. No urinary calculi or hydronephrosis. Bladder is unremarkable. Stomach/Bowel: Normal caliber large and small bowel. No bowel wall thickening. The appendix is not visualized.Stomach is within normal limits. Vascular/Lymphatic: No significant vascular findings are present. 1.7 cm left external iliac node demonstrates a normal fatty hilum and is favored reactive. Otherwise no abdominal or pelvic adenopathy. Reproductive: Unremarkable. Other: No free intraperitoneal fluid or air. Musculoskeletal: No acute fracture. IMPRESSION: No acute abnormality in the abdomen or pelvis. Electronically Signed   By: Minerva Fester M.D.   On: 05/29/2023 20:10    PROCEDURES:  Critical Care performed: No  Procedures   MEDICATIONS ORDERED IN ED: Medications  iohexol (OMNIPAQUE) 300 MG/ML solution 100 mL (100 mLs Intravenous Contrast Given 05/29/23 1955)  dicyclomine (BENTYL) capsule 10 mg (10 mg Oral Given 05/29/23 2109)  ondansetron (ZOFRAN) injection 4 mg (4 mg Intravenous Given 05/29/23 2109)     IMPRESSION / MDM / ASSESSMENT AND PLAN / ED COURSE  I reviewed the triage vital signs and the nursing notes.                                 Differential diagnosis includes, but is not limited to, gastroenteritis, gastritis, GERD, cholecystitis, choledocholithiasis, appendicitis, colitis, enteritis   Patient's presentation is most consistent with acute presentation with potential threat to life or bodily function.   Patient's diagnosis is consistent with gastroenteritis.  Patient presents emergency department with epigastric pain with nausea vomiting.  No diarrhea, constipation, urinary changes.  Was tender in the epigastric region without other  significant tenderness.  Labs were reassuring, imaging is reassuring with no acute findings.  Suspect a component of gastroenteritis or food poisoning.  Patiently prescribed Bentyl and Zofran.  Follow-up primary care as needed.  Return precautions discussed with the patient..  Patient is given ED precautions to return to the ED for any worsening or new symptoms.     FINAL CLINICAL IMPRESSION(S) / ED DIAGNOSES   Final diagnoses:  Gastroenteritis     Rx / DC Orders   ED Discharge Orders          Ordered    ondansetron (ZOFRAN-ODT) 4 MG disintegrating tablet  Every 8 hours PRN        05/29/23 2119    dicyclomine (BENTYL) 10 MG capsule  3 times daily before meals & bedtime        05/29/23 2119  Note:  This document was prepared using Dragon voice recognition software and may include unintentional dictation errors.   Lanette Hampshire 05/29/23 2124    Dionne Bucy, MD 05/30/23 [phone]

## 2023-05-29 NOTE — ED Triage Notes (Signed)
Patient c/o abdominal pain for a few days. Tender to touch. Last BM two days ago. Reports nausea and decreased appetite.

## 2023-06-02 DIAGNOSIS — E890 Postprocedural hypothyroidism: Secondary | ICD-10-CM | POA: Diagnosis not present

## 2023-06-02 DIAGNOSIS — E119 Type 2 diabetes mellitus without complications: Secondary | ICD-10-CM | POA: Diagnosis not present

## 2023-06-08 DIAGNOSIS — G4733 Obstructive sleep apnea (adult) (pediatric): Secondary | ICD-10-CM | POA: Diagnosis not present

## 2023-09-10 DIAGNOSIS — G4733 Obstructive sleep apnea (adult) (pediatric): Secondary | ICD-10-CM | POA: Diagnosis not present

## 2023-10-16 DIAGNOSIS — G4733 Obstructive sleep apnea (adult) (pediatric): Secondary | ICD-10-CM | POA: Diagnosis not present

## 2024-05-26 ENCOUNTER — Emergency Department
Admission: EM | Admit: 2024-05-26 | Discharge: 2024-05-27 | Disposition: A | Discharge status: Home / Self Care | Attending: Emergency Medicine | Admitting: Emergency Medicine

## 2024-05-26 ENCOUNTER — Other Ambulatory Visit: Payer: Self-pay

## 2024-05-26 DIAGNOSIS — I10 Essential (primary) hypertension: Secondary | ICD-10-CM | POA: Insufficient documentation

## 2024-05-26 DIAGNOSIS — Z7984 Long term (current) use of oral hypoglycemic drugs: Secondary | ICD-10-CM | POA: Diagnosis not present

## 2024-05-26 DIAGNOSIS — T887XXA Unspecified adverse effect of drug or medicament, initial encounter: Secondary | ICD-10-CM

## 2024-05-26 DIAGNOSIS — E119 Type 2 diabetes mellitus without complications: Secondary | ICD-10-CM | POA: Insufficient documentation

## 2024-05-26 DIAGNOSIS — R1031 Right lower quadrant pain: Secondary | ICD-10-CM | POA: Insufficient documentation

## 2024-05-26 DIAGNOSIS — T383X5A Adverse effect of insulin and oral hypoglycemic [antidiabetic] drugs, initial encounter: Secondary | ICD-10-CM | POA: Insufficient documentation

## 2024-05-26 DIAGNOSIS — R1030 Lower abdominal pain, unspecified: Secondary | ICD-10-CM

## 2024-05-26 DIAGNOSIS — R1013 Epigastric pain: Secondary | ICD-10-CM | POA: Diagnosis present

## 2024-05-26 DIAGNOSIS — R197 Diarrhea, unspecified: Secondary | ICD-10-CM | POA: Insufficient documentation

## 2024-05-26 DIAGNOSIS — R1032 Left lower quadrant pain: Secondary | ICD-10-CM | POA: Insufficient documentation

## 2024-05-26 LAB — COMPREHENSIVE METABOLIC PANEL WITH GFR
ALT: 10 U/L (ref 0–44)
AST: 11 U/L — ABNORMAL LOW (ref 15–41)
Albumin: 3.3 g/dL — ABNORMAL LOW (ref 3.5–5.0)
Alkaline Phosphatase: 85 U/L (ref 38–126)
Anion gap: 8 (ref 5–15)
BUN: 11 mg/dL (ref 6–20)
CO2: 27 mmol/L (ref 22–32)
Calcium: 8.3 mg/dL — ABNORMAL LOW (ref 8.9–10.3)
Chloride: 101 mmol/L (ref 98–111)
Creatinine, Ser: 0.83 mg/dL (ref 0.44–1.00)
GFR, Estimated: >60 mL/min (ref >60)
Glucose, Bld: 121 mg/dL — ABNORMAL HIGH (ref 70–99)
Potassium: 3.4 mmol/L — ABNORMAL LOW (ref 3.5–5.1)
Sodium: 136 mmol/L (ref 135–145)
Total Bilirubin: 0.5 mg/dL (ref 0.0–1.2)
Total Protein: 8.4 g/dL — ABNORMAL HIGH (ref 6.5–8.1)

## 2024-05-26 LAB — CBC WITH DIFFERENTIAL/PLATELET
Abs Immature Granulocytes: 0.03 K/uL (ref 0.00–0.07)
Basophils Absolute: 0 K/uL (ref 0.0–0.1)
Basophils Relative: 0 %
Eosinophils Absolute: 0 K/uL (ref 0.0–0.5)
Eosinophils Relative: 0 %
HCT: 35.3 % — ABNORMAL LOW (ref 36.0–46.0)
Hemoglobin: 10.9 g/dL — ABNORMAL LOW (ref 12.0–15.0)
Immature Granulocytes: 0 %
Lymphocytes Relative: 9 %
Lymphs Abs: 0.7 K/uL (ref 0.7–4.0)
MCH: 26.1 pg (ref 26.0–34.0)
MCHC: 30.9 g/dL (ref 30.0–36.0)
MCV: 84.4 fL (ref 80.0–100.0)
Monocytes Absolute: 0.4 K/uL (ref 0.1–1.0)
Monocytes Relative: 5 %
Neutro Abs: 6.7 K/uL (ref 1.7–7.7)
Neutrophils Relative %: 86 %
Platelets: 271 K/uL (ref 150–400)
RBC: 4.18 MIL/uL (ref 3.87–5.11)
RDW: 14.9 % (ref 11.5–15.5)
WBC: 7.8 K/uL (ref 4.0–10.5)
nRBC: 0 % (ref 0.0–0.2)

## 2024-05-26 LAB — LIPASE, BLOOD: Lipase: 42 U/L (ref 11–51)

## 2024-05-26 MED ORDER — METOCLOPRAMIDE HCL 5 MG/ML IJ SOLN
10.0000 mg | Freq: Once | INTRAMUSCULAR | Status: AC
  Administered 2024-05-26: 10 mg via INTRAVENOUS
  Filled 2024-05-26: qty 2

## 2024-05-26 MED ORDER — METOCLOPRAMIDE HCL 10 MG PO TABS: 10.0000 mg | ORAL_TABLET | Freq: Three times a day (TID) | ORAL | 0 refills | Status: AC

## 2024-05-26 MED ORDER — ACETAMINOPHEN 325 MG PO TABS
650.0000 mg | ORAL_TABLET | Freq: Once | ORAL | Status: AC
  Administered 2024-05-26: 650 mg via ORAL
  Filled 2024-05-26: qty 2

## 2024-05-26 MED ORDER — AZITHROMYCIN 500 MG PO TABS: 500.0000 mg | ORAL_TABLET | Freq: Every day | ORAL | 0 refills | Status: AC

## 2024-05-26 NOTE — ED Triage Notes (Signed)
 Pt to Ed from home for upper abdominal pain. Pt said this has been going on for about a month. No vomiting. PT has been taking Ozempic on and off for about a year and feels like the abdominal pain is related to when she takes her medication.   EMS Vitals: CBG: 130 HR: 102 O2:98% on room air BP: 166/82

## 2024-05-26 NOTE — ED Notes (Signed)
 Pt declined mental health help while she is here for this visit. Denises SI/HI.

## 2024-05-26 NOTE — ED Provider Notes (Signed)
 [facility] Provider Note   Event Date/Time   First MD Initiated Contact with Patient 05/26/24 2206     (approximate) History  Abdominal Pain  HPI Kimberly Nolan is a 38 y.o. female with a past medical history of Graves' disease, tobacco abuse, type 2 diabetes, hypertension, morbid obesity who presents complaining of intermittent abdominal pain that she describes as epigastric as well as bilateral lower quadrant and is worsened with any p.o. intake and after taking her Ozempic.  Patient states that she is on the highest dose of Ozempic and states she has been feeling the symptoms since she started these injections.  Patient denies any accompanying nausea, vomiting, diarrhea, or constipation.  Patient denies any other exacerbating or relieving factors.  Patient also endorses associated tenesmus occasionally ROS: Patient currently denies any vision changes, tinnitus, difficulty speaking, facial droop, sore throat, chest pain, shortness of breath, nausea/vomiting/diarrhea, dysuria, or weakness/numbness/paresthesias in any extremity   Physical Exam  Triage Vital Signs: ED Triage Vitals  Encounter Vitals Group     BP 05/26/24 2156 (!) 156/87     Girls Systolic BP Percentile --      Girls Diastolic BP Percentile --      Boys Systolic BP Percentile --      Boys Diastolic BP Percentile --      Pulse Rate 05/26/24 2156 90     Resp 05/26/24 2156 18     Temp 05/26/24 2156 (!) 100.9 F (38.3 C)     Temp Source 05/26/24 2156 Oral     SpO2 05/26/24 2156 100 %     Weight 05/26/24 2150 (!) 425 lb (192.8 kg)     Height 05/26/24 2150 5' 6 (1.676 m)     Head Circumference --      Peak Flow --      Pain Score 05/26/24 2149 8     Pain Loc --      Pain Education --      Exclude from Growth Chart --    Most recent vital signs: Vitals:   05/26/24 2230 05/27/24 0033  BP: (!) 162/86 115/83  Pulse: 82 86  Resp: (!) 22 18  Temp:    SpO2: 98% 97%   General: Awake,  oriented x4. CV:  Good peripheral perfusion. Resp:  Normal effort. Abd:  No distention. Other:  Morbidly obese middle-aged African-American female resting comfortably in no acute distress ED Results / Procedures / Treatments  Labs (all labs ordered are listed, but only abnormal results are displayed) Labs Reviewed  CBC WITH DIFFERENTIAL/PLATELET - Abnormal; Notable for the following components:      Result Value   Hemoglobin 10.9 (*)    HCT 35.3 (*)    All other components within normal limits  COMPREHENSIVE METABOLIC PANEL WITH GFR - Abnormal; Notable for the following components:   Potassium 3.4 (*)    Glucose, Bld 121 (*)    Calcium  8.3 (*)    Total Protein 8.4 (*)    Albumin 3.3 (*)    AST 11 (*)    All other components within normal limits  LIPASE, BLOOD   EKG ED ECG REPORT I, Artist MARLA Kerns, the attending physician, personally viewed and interpreted this ECG. Date: 05/26/2024 EKG Time: 2206 Rate: 87 Rhythm: normal sinus rhythm QRS Axis: normal Intervals: normal ST/T Wave abnormalities: normal Narrative Interpretation: no evidence of acute ischemia  PROCEDURES: Critical Care performed: No Procedures MEDICATIONS ORDERED IN ED: Medications  metoCLOPramide  (REGLAN ) injection 10  mg (10 mg Intravenous Given 05/26/24 2238)  acetaminophen  (TYLENOL ) tablet 650 mg (650 mg Oral Given 05/26/24 2320)   IMPRESSION / MDM / ASSESSMENT AND PLAN / ED COURSE  I reviewed the triage vital signs and the nursing notes.                             The patient is on the cardiac monitor to evaluate for evidence of arrhythmia and/or significant heart rate changes. Patient's presentation is most consistent with acute presentation with potential threat to life or bodily function. Patient's symptoms not typical for emergent causes of abdominal pain such as, but not limited to, appendicitis, abdominal aortic aneurysm, surgical biliary disease, pancreatitis, SBO, mesenteric ischemia, serious  intra-abdominal bacterial illness. Presentation also not typical of gynecologic emergencies such as TOA, Ovarian Torsion, PID. Not Ectopic. Doubt atypical ACS.  Pt tolerating PO. Disposition: Patient will be discharged with strict return precautions and follow up with primary MD within 12-24 hours for further evaluation. Patient understands that this still may have an early presentation of an emergent medical condition such as appendicitis that will require a recheck.   FINAL CLINICAL IMPRESSION(S) / ED DIAGNOSES   Final diagnoses:  Epigastric pain  Lower abdominal pain  Diarrhea, unspecified type  Medication side effect   Rx / DC Orders   ED Discharge Orders          Ordered    metoCLOPramide  (REGLAN ) 10 MG tablet  3 times daily with meals        05/26/24 2327    azithromycin  (ZITHROMAX ) 500 MG tablet  Daily        05/26/24 2327           Note:  This document was prepared using Dragon voice recognition software and may include unintentional dictation errors.   Frederica Chrestman K, MD 05/27/24 [phone]

## 2024-06-05 ENCOUNTER — Ambulatory Visit: Payer: Self-pay

## 2024-06-05 ENCOUNTER — Encounter: Payer: Self-pay | Admitting: Family Medicine

## 2024-06-05 DIAGNOSIS — Z202 Contact with and (suspected) exposure to infections with a predominantly sexual mode of transmission: Secondary | ICD-10-CM

## 2024-06-05 DIAGNOSIS — Z113 Encounter for screening for infections with a predominantly sexual mode of transmission: Secondary | ICD-10-CM | POA: Diagnosis not present

## 2024-06-05 LAB — HM HIV SCREENING LAB: HM HIV Screening: NEGATIVE

## 2024-06-05 LAB — WET PREP FOR TRICH, YEAST, CLUE
Clue Cell Exam: POSITIVE — AB
Trichomonas Exam: NEGATIVE
Yeast Exam: NEGATIVE

## 2024-06-05 MED ORDER — METRONIDAZOLE 500 MG PO TABS: 500.0000 mg | ORAL_TABLET | Freq: Two times a day (BID) | ORAL | Status: AC

## 2024-06-05 NOTE — Progress Notes (Signed)
 Pt here for STI screening and for treatment as contact to [facility].  Wet mount result reviewed with patient.  Metronidazole  500mg  #14 dispensed to patient.  Counseled re medication, side effects, plan of care and when to contact clinic with questions or concerns.  Condoms Gershon Pry, RN

## 2024-06-05 NOTE — Progress Notes (Signed)
 [facility] STI clinic [address], [address] [place] [address] Main phone: [phone]  STI screening visit  Subjective:  Kimberly Nolan is a 38 y.o. female being seen today for an STI screening visit. The patient reports they do have symptoms. Patient reports they are a contact for trichomonas.  Patient's last menstrual period was 05/20/2024 (approximate).  Patient has the following medical conditions:  Patient Active Problem List   Diagnosis Date Noted   Iron deficiency anemia due to chronic blood loss 10/13/2022   History of pulmonary embolism 10/13/2022   Normocytic anemia 01/18/2020   Pulmonary embolism (HCC) 10/19/2019   DVT (deep venous thrombosis) (HCC) 10/19/2019   Do not resuscitate status 10/19/2019   Pulmonary emboli (HCC) 10/19/2019   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   Preop cardiovascular exam 06/14/2019   Tobacco dependence 03/02/2019   Graves' disease 03/02/2019   Low back pain 03/29/2018   Lumbar radiculopathy 03/29/2018   Thyroid  storm 02/17/2017   Thyroiditis 02/17/2017   Goiter 02/17/2017   Hyperglycemia 02/17/2017   Prediabetes 02/17/2017   Morbid obesity (HCC) 02/17/2017   Hyperthyroidism 02/16/2017   Graves disease 02/16/2017   Essential hypertension 02/16/2017   Current every day smoker 02/27/2015   Morbid obesity with BMI of 50.0-59.9, adult (HCC) 02/27/2015   Shortness of breath 09/13/2012   CAP (community acquired pneumonia) 09/13/2012   Mediastinal mass 09/13/2012   Leukocytosis 09/13/2012   Palpitations 09/13/2012   Chief Complaint  Patient presents with   SEXUALLY TRANSMITTED DISEASE    HPI Patient reports she was informed she was exposed to trichomonas sometime in the beginning of July. Does endorse some itching. No other symptoms.  Reports she is ready to quit smoking, provided her with quit line information.  See flowsheet for further  details and programmatic requirements Hyperlink available at the top of the signed note in blue.  Flow sheet content below:  Pregnancy Intention Screening Does the patient want to become pregnant in the next year?: No Does the patient's partner want to become pregnant in the next year?: No Would the patient like to discuss contraceptive options today?: No Reason For STD Screen STD Screening: Has symptoms, Is a contact Have you ever had an STD?: No History of Antibiotic use in the past 2 weeks?: No STD Symptoms Denies all: No Genital Itching: Yes Lower abdominal pain: No Discharge: No Dysuria: No Genital ulcer / lesion: No Rash: No Vaginal irritation: No Oral / Other skin ulcer: No Pain with sex: No Sore Throat: No Visual Changes: No Vaginal Bleeding: No Risk Factors for Hep B Household, sexual, or needle sharing contact of a person infected with Hep B: No Sexual contact with a person who uses drugs not as prescribed?: No Currently or Ever used drugs not as prescribed: No HIV Positive: No PRep Patient: No Men who have sex with men: No Have Hepatitis C: No History of Incarceration: No History of Homeslessness?: No Anal sex following anal drug use?: No Risk Factors for Hep C Currently using drugs not as prescribed: No Sexual partner(s) currently using drugs as not prescribed: No History of drug use: No HIV Positive: No People with a history of incarceration: No People born between the years of 22 and 42: No Advise Advised client to quit or stay quit. : Yes Abuse History Has patient ever been abused physically?: No Has patient ever been abused sexually?: No Does patient feel they have a problem with Anxiety?: No  Does patient feel they have a problem with Depression?: No Referral to Behavioral Health: Declined Counseling Patient counseled to use condoms with all sex: Condoms given RTC in 2-3 weeks for test results: Yes Clinic will call if test results abnormal  before test result appt.: Yes Test results given to patient Patient counseled to use condoms with all sex: Condoms given   Screening for MPX risk: Does the patient have an unexplained rash? No Is the patient MSM? No Does the patient endorse multiple sex partners or anonymous sex partners? No Did the patient have close or sexual contact with a person diagnosed with MPX? No Has the patient traveled outside the [place]  where MPX is endemic? No Is there a high clinical suspicion for MPX-- evidenced by one of the following No  -Unlikely to be chickenpox  -Lymphadenopathy  -Rash that present in same phase of evolution on any given body part  Screenings: Last HIV test per patient/review of record was No results found for: HMHIVSCREEN  Lab Results  Component Value Date   HIV Non Reactive 06/27/2019     Last HEPC test per patient/review of record was No results found for: HMHEPCSCREEN No components found for: HEPC   Last HEPB test per patient/review of record was No components found for: HMHEPBSCREEN   Patient reports last pap was:   No results found for: SPECADGYN Result Date Procedure Results Follow-ups  05/13/2015 IGP, rfx Aptima HPV ASCU    05/13/2015 HM PAP SMEAR HM Pap smear: Normal HPV Net     Immunization history:  Immunization History  Administered Date(s) Administered   Influenza,inj,Quad PF,6+ Mos 10/20/2019   Moderna Sars-Covid-2 Vaccination 09/07/2022   PFIZER(Purple Top)SARS-COV-2 Vaccination 02/14/2020, 03/11/2020   Pneumococcal Polysaccharide-23 10/20/2019    The following portions of the patient's history were reviewed and updated as appropriate: allergies, current medications, past medical history, past social history, past surgical history and problem list.  Objective:  There were no vitals filed for this visit.  Physical Exam Exam conducted with a chaperone present Ilah B).  Constitutional:      Appearance: Normal appearance.  HENT:     Head:  Normocephalic.     Mouth/Throat:     Mouth: Mucous membranes are moist.     Pharynx: No oropharyngeal exudate or posterior oropharyngeal erythema.  Eyes:     General: No scleral icterus.       Right eye: No discharge.        Left eye: No discharge.  Pulmonary:     Effort: Pulmonary effort is normal.  Genitourinary:    General: Normal vulva.     Labia:        Right: No rash, tenderness, lesion or injury.        Left: No rash, tenderness, lesion or injury.      Vagina: Vaginal discharge present. No tenderness or lesions.     Cervix: No discharge, friability, lesion, erythema or cervical bleeding.     Comments: White discharge Lymphadenopathy:     Cervical: No cervical adenopathy.     Right cervical: No superficial or posterior cervical adenopathy.    Left cervical: No superficial or posterior cervical adenopathy.  Skin:    General: Skin is warm and dry.  Neurological:     Mental Status: She is alert.  Psychiatric:        Mood and Affect: Mood normal.        Behavior: Behavior normal.    Assessment and Plan:  Kimberly Nolan is  a 38 y.o. female presenting to the [facility] Department for STI screening  1. Trichomonas contact (Primary)  - Wet prep negative today for trichomonas, positive for clue cells. Will treat as contact.  - metroNIDAZOLE  (FLAGYL ) 500 MG tablet; Take 1 tablet (500 mg total) by mouth 2 (two) times daily for 7 days.  2. Screening for venereal disease  - Gonococcus culture - Chlamydia/Gonorrhea [facility] - WET PREP FOR TRICH, YEAST, CLUE - HIV [facility] - Syphilis Serology, [facility]  Patient accepted the following screenings: oral GC culture, vaginal CT/GC swab, vaginal wet prep, HIV, and RPR Patient meets criteria for HepB screening? No. Ordered? no Patient meets criteria for HepC screening? No. Ordered? no  Treat wet prep per standing order Discussed time line for [facility] results and that patient will be called with  positive results and encouraged patient to call if she had not heard in 2 weeks.  Counseled to return or seek care for continued or worsening symptoms Recommended repeat testing in 3 months with positive results. Recommended condom use with all sex for STI prevention.   No follow-ups on file.  Future Appointments  Date Time Provider Department Center  06/05/2024  9:50 AM Macario Dorothyann HERO, MD [facility]-STI None    Damien FORBES Satchel [facility]

## 2024-06-10 LAB — GONOCOCCUS CULTURE

## 2024-06-13 ENCOUNTER — Encounter: Payer: Self-pay | Admitting: Family Medicine

## 2024-11-22 ENCOUNTER — Ambulatory Visit: Admission: EM | Admit: 2024-11-22 | Discharge: 2024-11-22 | Disposition: A | Discharge status: Home / Self Care

## 2024-11-22 VITALS — BP 137/79 | HR 77 | Temp 98.2°F | Resp 20

## 2024-11-22 DIAGNOSIS — S90422A Blister (nonthermal), left great toe, initial encounter: Secondary | ICD-10-CM | POA: Diagnosis not present

## 2024-11-22 MED ORDER — DOXYCYCLINE HYCLATE 100 MG PO CAPS: 100.0000 mg | ORAL_CAPSULE | Freq: Two times a day (BID) | ORAL | 0 refills | Status: AC

## 2024-11-22 NOTE — ED Provider Notes (Signed)
 " [facility]    CSN: [id number] Arrival date & time: 11/22/24  1808      History   Chief Complaint Chief Complaint  Patient presents with   Wound Check    HPI Kimberly Nolan is a 39 y.o. female.   Patient presents for evaluation of swelling noticed to the  left great toe 3 days ago, progressively worsening sw and began to experience pain then noticed a blister to the foot 2 days ago and at that time it had opened up and skin was coming off.  Has noticed clear drainage but unsure presence of pus.  Has kept area covered with a bandage.  Denies any friction or rubbing.  Has numbness at baseline which has not worsened.  Denies presence of fever.     Past Medical History:  Diagnosis Date   Anginal pain    Diabetes mellitus without complication (HCC)    Dyspnea    Essential hypertension    Family history of adverse reaction to anesthesia    Mother slow to wake up after surgery   Graves disease    Lower extremity edema    Morbid obesity (HCC)    Smoker     Patient Active Problem List   Diagnosis Date Noted   Iron deficiency anemia due to chronic blood loss 10/13/2022   History of pulmonary embolism 10/13/2022   Normocytic anemia 01/18/2020   Pulmonary embolism (HCC) 10/19/2019   DVT (deep venous thrombosis) (HCC) 10/19/2019   Do not resuscitate status 10/19/2019   Pulmonary emboli (HCC) 10/19/2019   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   Preop cardiovascular exam 06/14/2019   Tobacco dependence 03/02/2019   Graves' disease 03/02/2019   Low back pain 03/29/2018   Lumbar radiculopathy 03/29/2018   Thyroid  storm 02/17/2017   Thyroiditis 02/17/2017   Goiter 02/17/2017   Hyperglycemia 02/17/2017   Prediabetes 02/17/2017   Morbid obesity (HCC) 02/17/2017   Hyperthyroidism 02/16/2017   Graves disease 02/16/2017   Essential hypertension 02/16/2017   Current every day smoker 02/27/2015   Morbid obesity with BMI of  50.0-59.9, adult (HCC) 02/27/2015   Shortness of breath 09/13/2012   CAP (community acquired pneumonia) 09/13/2012   Mediastinal mass 09/13/2012   Leukocytosis 09/13/2012   Palpitations 09/13/2012    Past Surgical History:  Procedure Laterality Date   CESAREAN SECTION     PARATHYROIDECTOMY  06/27/2019   Procedure: PARATHYROIDECTOMY AUTOTRANSPLANT;  Surgeon: Marolyn Nest, MD;  Location: [facility] ORS;  Service: General;;   THYROIDECTOMY N/A 06/27/2019   Procedure: TOTAL THYROIDECTOMY ;  Surgeon: Marolyn Nest, MD;  Location: [facility] ORS;  Service: General;  Laterality: N/A;    OB History   No obstetric history on file.      Home Medications    Prior to Admission medications  Medication Sig Start Date End Date Taking? Authorizing Provider  furosemide  (LASIX ) 20 MG tablet Take 20 mg by mouth as needed. 02/02/24  Yes [provider]  liraglutide (VICTOZA) 18 MG/3ML SOPN Inject 1.8 mg into the skin daily. 09/03/24  Yes [provider]  traZODone (DESYREL) 50 MG tablet Take 50 mg by mouth at bedtime. 02/01/24  Yes [provider]  apixaban  (ELIQUIS ) 2.5 MG TABS tablet Take 1 tablet (2.5 mg total) by mouth 2 (two) times daily. 04/05/22   Babara Call, MD  baclofen (LIORESAL) 10 MG tablet Take 10 mg by mouth every 8 (eight) hours as needed. 10/03/19   [provider]  dicyclomine  (  BENTYL ) 10 MG capsule Take 1 capsule (10 mg total) by mouth 4 (four) times daily -  before meals and at bedtime. 05/29/23   Cuthriell, Dorn BIRCH, PA-C  escitalopram (LEXAPRO) 10 MG tablet Take 10 mg by mouth daily.    [provider]  gabapentin  (NEURONTIN ) 300 MG capsule gabapentin  300 mg capsule  PLEASE SEE ATTACHED FOR DETAILED DIRECTIONS 12/06/19   [provider]  Iron-Vitamin C (VITRON-C) 65-125 MG TABS Take 1 tablet by mouth daily. 03/06/21   Babara Call, MD  levothyroxine  (SYNTHROID ) 200 MCG tablet Take 200 mcg by mouth daily. 09/26/19 10/13/22  [provider]  metFORMIN (GLUCOPHAGE-XR) 500 MG 24 hr tablet Take 500 mg by mouth every morning. 07/09/21   [provider]  methocarbamol  (ROBAXIN ) 500 MG tablet Take 500 mg by mouth 4 (four) times daily.    [provider]  metoCLOPramide  (REGLAN ) 10 MG tablet Take 1 tablet (10 mg total) by mouth 3 (three) times daily with meals. 05/26/24 06/25/24  Bradler, Evan K, MD  ondansetron  (ZOFRAN -ODT) 4 MG disintegrating tablet Take 1 tablet (4 mg total) by mouth every 8 (eight) hours as needed. 05/29/23   Cuthriell, Dorn BIRCH, PA-C  OZEMPIC, 0.25 OR 0.5 MG/DOSE, 2 MG/3ML SOPN Inject 0.5 mg into the skin once a week.    [provider]  pregabalin (LYRICA) 50 MG capsule Take 50 mg by mouth 3 (three) times daily.    [provider]    Family History Family History  Problem Relation Age of Onset   Thyroid  disease Mother    Hypertension Father    Diabetes Brother    Hypertension Brother    Brain cancer Maternal Aunt    Prostate cancer Maternal Grandfather     Social History Social History[1]   Allergies   Penicillins   Review of Systems Review of Systems   Physical Exam Triage Vital Signs ED Triage Vitals  Encounter Vitals Group     BP 11/22/24 1839 137/79     Girls Systolic BP Percentile --      Girls Diastolic BP Percentile --      Boys Systolic BP Percentile --      Boys Diastolic BP Percentile --      Pulse Rate 11/22/24 1839 77     Resp 11/22/24 1839 20     Temp 11/22/24 1839 98.2 F (36.8 C)     Temp Source 11/22/24 1839 Oral     SpO2 11/22/24 1839 97 %     Weight --      Height --      Head Circumference --      Peak Flow --      Pain Score 11/22/24 1843 6     Pain Loc --      Pain Education --      Exclude from Growth Chart --    No data found.  Updated Vital Signs BP 137/79 (BP Location: Right Arm)   Pulse 77   Temp 98.2 F (36.8 C) (Oral)   Resp 20   LMP 10/22/2024 (Approximate)   SpO2 97%   Visual Acuity Right Eye  Distance:   Left Eye Distance:   Bilateral Distance:    Right Eye Near:   Left Eye Near:    Bilateral Near:     Physical Exam Constitutional:      Appearance: Normal appearance.  Eyes:     Extraocular Movements: Extraocular movements intact.  Pulmonary:     Effort: Pulmonary  effort is normal.  Skin:    Comments: 2 x 3 cm blister present to the left great toe, serous fluid draining, no swelling noted, decreased sensation, capillary refill less than 3  Neurological:     Mental Status: She is alert and oriented to person, place, and time.      UC Treatments / Results  Labs (all labs ordered are listed, but only abnormal results are displayed) Labs Reviewed - No data to display  EKG   Radiology No results found.  Procedures Procedures (including critical care time)  Medications Ordered in UC Medications - No data to display  Initial Impression / Assessment and Plan / UC Course  I have reviewed the triage vital signs and the nursing notes.  Pertinent labs & imaging results that were available during my care of the patient were reviewed by me and considered in my medical decision making (see chart for details).  Nonthermal blister to the left great toe  Appears to be most consistent with a friction blister however patient does deny occurrence as she typically does not wear closed toed shoes, does not appear to be infected, history of diabetes empirically placed on doxycycline , wound cleansed in office with antiseptic dermal cleanser and covered with a nonadherent dressing, may leave open to air at home but if at risk for contamination advised a nonadherent dressing, advised to monitor and if no improvement within 7 days patient to schedule follow-up appointment with the wound care center  Final Clinical Impressions(s) / UC Diagnoses   Final diagnoses:  None   Discharge Instructions   None    ED Prescriptions   None    PDMP not reviewed this encounter.      [1]  Social History Tobacco Use   Smoking status: Every Day    Current packs/day: 0.00    Average packs/day: 0.5 packs/day for 3.0 years (1.5 ttl pk-yrs)    Types: Cigarettes, E-cigarettes    Start date: 10/15/2016    Last attempt to quit: 10/16/2019    Years since quitting: 5.1   Smokeless tobacco: Never  Vaping Use   Vaping status: Every Day  Substance Use Topics   Alcohol use: Yes    Comment: Occasionally   Drug use: No     Teresa Shelba SAUNDERS, NP 11/22/24 1930  "

## 2024-11-22 NOTE — ED Triage Notes (Signed)
 Patient reports a wound to left big toe. Patient denies injury. Patient complains of pain and swelling to left big toe x 4 days. Rates pain 6/10.

## 2024-11-22 NOTE — Discharge Instructions (Addendum)
 Today you are evaluated for the wound to the toe which is most consistent with a blister, at this time does not appear infected but you will be started on antibiotics to ensure that area does not become infected and can heal properly  Take doxycycline  twice daily for 7 days  Wound has been cleansed in the clinic  At home cleanse daily with unscented soap and water, pat and do not rub  May leave open to air however if at risk for becoming dirty cover with a nonstick dressing  You may apply topical antibiotic ointment into the wound bed per preference  If you have not seeing any improvement with healing please reach out to the wound care center for follow-up exam
